# Patient Record
Sex: Female | Born: 1950
Health system: Southern US, Community
[De-identification: ages and names within clinical notes are randomized; demographics above are authoritative.]

## PROBLEM LIST (undated history)

## (undated) DIAGNOSIS — I10 Essential (primary) hypertension: Secondary | ICD-10-CM

## (undated) DIAGNOSIS — G459 Transient cerebral ischemic attack, unspecified: Secondary | ICD-10-CM

## (undated) DIAGNOSIS — Z860100 Personal history of colon polyps, unspecified: Secondary | ICD-10-CM

## (undated) DIAGNOSIS — I639 Cerebral infarction, unspecified: Secondary | ICD-10-CM

## (undated) DIAGNOSIS — R233 Spontaneous ecchymoses: Secondary | ICD-10-CM

## (undated) DIAGNOSIS — L299 Pruritus, unspecified: Secondary | ICD-10-CM

## (undated) DIAGNOSIS — R079 Chest pain, unspecified: Secondary | ICD-10-CM

## (undated) DIAGNOSIS — M1990 Unspecified osteoarthritis, unspecified site: Secondary | ICD-10-CM

## (undated) DIAGNOSIS — J189 Pneumonia, unspecified organism: Secondary | ICD-10-CM

## (undated) DIAGNOSIS — R519 Headache, unspecified: Secondary | ICD-10-CM

## (undated) DIAGNOSIS — R609 Edema, unspecified: Secondary | ICD-10-CM

## (undated) DIAGNOSIS — Z9289 Personal history of other medical treatment: Secondary | ICD-10-CM

## (undated) DIAGNOSIS — T8859XA Other complications of anesthesia, initial encounter: Secondary | ICD-10-CM

## (undated) DIAGNOSIS — M1711 Unilateral primary osteoarthritis, right knee: Secondary | ICD-10-CM

## (undated) DIAGNOSIS — R238 Other skin changes: Secondary | ICD-10-CM

## (undated) DIAGNOSIS — E039 Hypothyroidism, unspecified: Secondary | ICD-10-CM

## (undated) DIAGNOSIS — M1712 Unilateral primary osteoarthritis, left knee: Secondary | ICD-10-CM

## (undated) DIAGNOSIS — E559 Vitamin D deficiency, unspecified: Secondary | ICD-10-CM

## (undated) DIAGNOSIS — R7303 Prediabetes: Secondary | ICD-10-CM

## (undated) DIAGNOSIS — M62838 Other muscle spasm: Secondary | ICD-10-CM

## (undated) DIAGNOSIS — Z8669 Personal history of other diseases of the nervous system and sense organs: Secondary | ICD-10-CM

## (undated) DIAGNOSIS — K219 Gastro-esophageal reflux disease without esophagitis: Secondary | ICD-10-CM

## (undated) DIAGNOSIS — E739 Lactose intolerance, unspecified: Secondary | ICD-10-CM

## (undated) DIAGNOSIS — Z889 Allergy status to unspecified drugs, medicaments and biological substances status: Secondary | ICD-10-CM

## (undated) DIAGNOSIS — K7689 Other specified diseases of liver: Secondary | ICD-10-CM

## (undated) DIAGNOSIS — J984 Other disorders of lung: Secondary | ICD-10-CM

## (undated) DIAGNOSIS — D689 Coagulation defect, unspecified: Secondary | ICD-10-CM

## (undated) DIAGNOSIS — J45909 Unspecified asthma, uncomplicated: Secondary | ICD-10-CM

## (undated) DIAGNOSIS — R1310 Dysphagia, unspecified: Secondary | ICD-10-CM

## (undated) DIAGNOSIS — K5900 Constipation, unspecified: Secondary | ICD-10-CM

## (undated) DIAGNOSIS — F419 Anxiety disorder, unspecified: Secondary | ICD-10-CM

## (undated) DIAGNOSIS — M549 Dorsalgia, unspecified: Secondary | ICD-10-CM

## (undated) DIAGNOSIS — M7989 Other specified soft tissue disorders: Secondary | ICD-10-CM

## (undated) DIAGNOSIS — R51 Headache: Secondary | ICD-10-CM

## (undated) DIAGNOSIS — D649 Anemia, unspecified: Secondary | ICD-10-CM

## (undated) DIAGNOSIS — R600 Localized edema: Secondary | ICD-10-CM

## (undated) DIAGNOSIS — F32A Depression, unspecified: Secondary | ICD-10-CM

## (undated) DIAGNOSIS — R011 Cardiac murmur, unspecified: Secondary | ICD-10-CM

## (undated) DIAGNOSIS — R42 Dizziness and giddiness: Secondary | ICD-10-CM

## (undated) DIAGNOSIS — G4730 Sleep apnea, unspecified: Secondary | ICD-10-CM

## (undated) DIAGNOSIS — T7840XA Allergy, unspecified, initial encounter: Secondary | ICD-10-CM

## (undated) DIAGNOSIS — M2550 Pain in unspecified joint: Secondary | ICD-10-CM

## (undated) DIAGNOSIS — E785 Hyperlipidemia, unspecified: Secondary | ICD-10-CM

## (undated) DIAGNOSIS — T4145XA Adverse effect of unspecified anesthetic, initial encounter: Secondary | ICD-10-CM

## (undated) DIAGNOSIS — E538 Deficiency of other specified B group vitamins: Secondary | ICD-10-CM

## (undated) DIAGNOSIS — M2540 Effusion, unspecified joint: Secondary | ICD-10-CM

## (undated) HISTORY — PX: KNEE ARTHROSCOPY: SHX127

## (undated) HISTORY — DX: Lactose intolerance, unspecified: E73.9

## (undated) HISTORY — DX: Depression, unspecified: F32.A

## (undated) HISTORY — PX: ABDOMINAL HYSTERECTOMY: SHX81

## (undated) HISTORY — DX: Vitamin D deficiency, unspecified: E55.9

## (undated) HISTORY — PX: OTHER SURGICAL HISTORY: SHX169

## (undated) HISTORY — DX: Transient cerebral ischemic attack, unspecified: G45.9

## (undated) HISTORY — DX: Other specified diseases of liver: K76.89

## (undated) HISTORY — DX: Cerebral infarction, unspecified: I63.9

## (undated) HISTORY — DX: Unspecified osteoarthritis, unspecified site: M19.90

## (undated) HISTORY — DX: Allergy, unspecified, initial encounter: T78.40XA

## (undated) HISTORY — DX: Deficiency of other specified B group vitamins: E53.8

## (undated) HISTORY — DX: Other specified soft tissue disorders: M79.89

## (undated) HISTORY — DX: Chest pain, unspecified: R07.9

## (undated) HISTORY — DX: Coagulation defect, unspecified: D68.9

## (undated) HISTORY — DX: Constipation, unspecified: K59.00

## (undated) HISTORY — DX: Other disorders of lung: J98.4

## (undated) HISTORY — PX: JOINT REPLACEMENT: SHX530

## (undated) HISTORY — PX: TONSILLECTOMY: SUR1361

## (undated) HISTORY — PX: COLONOSCOPY: SHX174

## (undated) HISTORY — PX: BREAST SURGERY: SHX581

## (undated) HISTORY — PX: CARDIAC CATHETERIZATION: SHX172

## (undated) HISTORY — DX: Dysphagia, unspecified: R13.10

## (undated) HISTORY — PX: ESOPHAGOGASTRODUODENOSCOPY: SHX1529

## (undated) HISTORY — PX: BACK SURGERY: SHX140

## (undated) HISTORY — DX: Prediabetes: R73.03

## (undated) HISTORY — DX: Dorsalgia, unspecified: M54.9

---

## 2014-06-09 ENCOUNTER — Encounter (HOSPITAL_COMMUNITY): Payer: Self-pay

## 2014-06-09 ENCOUNTER — Other Ambulatory Visit (HOSPITAL_COMMUNITY): Payer: Self-pay

## 2014-06-09 ENCOUNTER — Encounter (HOSPITAL_COMMUNITY)
Admission: RE | Admit: 2014-06-09 | Discharge: 2014-06-09 | Disposition: A | Discharge status: Home / Self Care | Payer: BC Managed Care – PPO | Source: Ambulatory Visit | Attending: Orthopedic Surgery | Admitting: Orthopedic Surgery

## 2014-06-09 DIAGNOSIS — I517 Cardiomegaly: Secondary | ICD-10-CM | POA: Diagnosis not present

## 2014-06-09 DIAGNOSIS — E039 Hypothyroidism, unspecified: Secondary | ICD-10-CM | POA: Diagnosis not present

## 2014-06-09 DIAGNOSIS — R011 Cardiac murmur, unspecified: Secondary | ICD-10-CM | POA: Insufficient documentation

## 2014-06-09 DIAGNOSIS — Z90710 Acquired absence of both cervix and uterus: Secondary | ICD-10-CM | POA: Diagnosis not present

## 2014-06-09 DIAGNOSIS — F419 Anxiety disorder, unspecified: Secondary | ICD-10-CM | POA: Diagnosis not present

## 2014-06-09 DIAGNOSIS — K219 Gastro-esophageal reflux disease without esophagitis: Secondary | ICD-10-CM | POA: Insufficient documentation

## 2014-06-09 DIAGNOSIS — Z8673 Personal history of transient ischemic attack (TIA), and cerebral infarction without residual deficits: Secondary | ICD-10-CM | POA: Insufficient documentation

## 2014-06-09 DIAGNOSIS — M1990 Unspecified osteoarthritis, unspecified site: Secondary | ICD-10-CM | POA: Insufficient documentation

## 2014-06-09 DIAGNOSIS — Z01818 Encounter for other preprocedural examination: Secondary | ICD-10-CM | POA: Diagnosis not present

## 2014-06-09 DIAGNOSIS — J45909 Unspecified asthma, uncomplicated: Secondary | ICD-10-CM | POA: Insufficient documentation

## 2014-06-09 DIAGNOSIS — G4733 Obstructive sleep apnea (adult) (pediatric): Secondary | ICD-10-CM | POA: Insufficient documentation

## 2014-06-09 DIAGNOSIS — R51 Headache: Secondary | ICD-10-CM | POA: Insufficient documentation

## 2014-06-09 DIAGNOSIS — I10 Essential (primary) hypertension: Secondary | ICD-10-CM | POA: Diagnosis not present

## 2014-06-09 HISTORY — DX: Pneumonia, unspecified organism: J18.9

## 2014-06-09 HISTORY — DX: Transient cerebral ischemic attack, unspecified: G45.9

## 2014-06-09 HISTORY — DX: Essential (primary) hypertension: I10

## 2014-06-09 HISTORY — DX: Cardiac murmur, unspecified: R01.1

## 2014-06-09 HISTORY — DX: Personal history of other medical treatment: Z92.89

## 2014-06-09 HISTORY — DX: Hypothyroidism, unspecified: E03.9

## 2014-06-09 HISTORY — DX: Headache, unspecified: R51.9

## 2014-06-09 HISTORY — DX: Unspecified osteoarthritis, unspecified site: M19.90

## 2014-06-09 HISTORY — DX: Headache: R51

## 2014-06-09 HISTORY — DX: Other complications of anesthesia, initial encounter: T88.59XA

## 2014-06-09 HISTORY — DX: Sleep apnea, unspecified: G47.30

## 2014-06-09 HISTORY — DX: Adverse effect of unspecified anesthetic, initial encounter: T41.45XA

## 2014-06-09 HISTORY — DX: Unspecified asthma, uncomplicated: J45.909

## 2014-06-09 HISTORY — DX: Anxiety disorder, unspecified: F41.9

## 2014-06-09 HISTORY — DX: Gastro-esophageal reflux disease without esophagitis: K21.9

## 2014-06-09 LAB — BASIC METABOLIC PANEL
Anion gap: 13 (ref 5–15)
BUN: 24 mg/dL — ABNORMAL HIGH (ref 6–23)
CO2: 30 mEq/L (ref 19–32)
Calcium: 9.7 mg/dL (ref 8.4–10.5)
Chloride: 97 mEq/L (ref 96–112)
Creatinine, Ser: 1.4 mg/dL — ABNORMAL HIGH (ref 0.50–1.10)
GFR calc Af Amer: 46 mL/min — ABNORMAL LOW (ref >90)
GFR calc non Af Amer: 40 mL/min — ABNORMAL LOW (ref >90)
Glucose, Bld: 97 mg/dL (ref 70–99)
Potassium: 3.2 mEq/L — ABNORMAL LOW (ref 3.7–5.3)
Sodium: 140 mEq/L (ref 137–147)

## 2014-06-09 LAB — PROTIME-INR
INR: 1.02 (ref 0.00–1.49)
Prothrombin Time: 13.5 seconds (ref 11.6–15.2)

## 2014-06-09 LAB — SURGICAL PCR SCREEN
MRSA, PCR: NEGATIVE
Staphylococcus aureus: NEGATIVE

## 2014-06-09 LAB — CBC
HCT: 34.8 % — ABNORMAL LOW (ref 36.0–46.0)
Hemoglobin: 11.5 g/dL — ABNORMAL LOW (ref 12.0–15.0)
MCH: 30 pg (ref 26.0–34.0)
MCHC: 33 g/dL (ref 30.0–36.0)
MCV: 90.9 fL (ref 78.0–100.0)
Platelets: 375 10*3/uL (ref 150–400)
RBC: 3.83 MIL/uL — ABNORMAL LOW (ref 3.87–5.11)
RDW: 14.8 % (ref 11.5–15.5)
WBC: 8.4 10*3/uL (ref 4.0–10.5)

## 2014-06-09 LAB — APTT: aPTT: 37 seconds (ref 24–37)

## 2014-06-09 NOTE — Pre-Procedure Instructions (Signed)
Tamara Daugherty  06/09/2014   Your procedure is scheduled on:  Tuesday, December 29th  Report to [facility] Admitting at 530 AM.  Call this number if you have problems the morning of surgery: [phone]   Remember:   Do not eat food or drink liquids after midnight.   Take these medicines the morning of surgery with A SIP OF WATER:    Stop plavix 7 days prior to surgery per dr. Brynda Greathouse   Do not wear jewelry, make-up or nail polish.  Do not wear lotions, powders, or perfumes, deodorant.  Do not shave 48 hours prior to surgery. Men may shave face and neck.  Do not bring valuables to the hospital.  [facility] is not responsible  for any belongings or valuables.               Contacts, dentures or bridgework may not be worn into surgery.  Leave suitcase in the car. After surgery it may be brought to your room.  For patients admitted to the hospital, discharge time is determined by your  treatment team.           Please read over the following fact sheets that you were given: Pain Booklet, Coughing and Deep Breathing, MRSA Information and Surgical Site Infection Prevention  [facility] - Preparing for Surgery  Before surgery, you can play an important role.  Because skin is not sterile, your skin needs to be as free of germs as possible.  You can reduce the number of germs on you skin by washing with CHG (chlorahexidine gluconate) soap before surgery.  CHG is an antiseptic cleaner which kills germs and bonds with the skin to continue killing germs even after washing.  Please DO NOT use if you have an allergy to CHG or antibacterial soaps.  If your skin becomes reddened/irritated stop using the CHG and inform your nurse when you arrive at Short Stay.  Do not shave (including legs and underarms) for at least 48 hours prior to the first CHG shower.  You may shave your face.  Please follow these instructions carefully:   1.  Shower with CHG Soap the night before surgery and the  morning of Surgery.  2.  If you choose to wash your hair, wash your hair first as usual with your normal shampoo.  3.  After you shampoo, rinse your hair and body thoroughly to remove the shampoo.  4.  Use CHG as you would any other liquid soap.  You can apply CHG directly to the skin and wash gently with scrungie or a clean washcloth.  5.  Apply the CHG Soap to your body ONLY FROM THE NECK DOWN.  Do not use on open wounds or open sores.  Avoid contact with your eyes, ears, mouth and genitals (private parts).  Wash genitals (private parts) with your normal soap.  6.  Wash thoroughly, paying special attention to the area where your surgery will be performed.  7.  Thoroughly rinse your body with warm water from the neck down.  8.  DO NOT shower/wash with your normal soap after using and rinsing off the CHG Soap.  9.  Pat yourself dry with a clean towel.            10.  Wear clean pajamas.            11.  Place clean sheets on your bed the night of your first shower and do not sleep with pets.  Day of Surgery  Do not apply any lotions/deoderants the morning of surgery.  Please wear clean clothes to the hospital/surgery center.

## 2014-06-09 NOTE — Progress Notes (Addendum)
Anesthesia Chart Review:  Patient is a 63 year old female scheduled for left TKA on 06/21/14 by Dr. Mardelle Matte.  History includes non-smoker, HTN, TIA, murmur (no valvular abnormality on 09/2013 echo), hypothyroidism, OSA with CPAP, anxiety, asthma, GERD, headaches, arthritis, hysterectomy. BMI is consistent with morbid obesity.  PCP is Dr. Brynda Greathouse who cleared patient from a medical and cardiac standpoint with permission to hold Plavix 7 days prior to surgery. Cardiologist is Dr. Ashby Dawes in [place], last visit 12/13/13 with normal perfusion scan. Pulmonologist is Dr. Elsworth Soho. Many of these [place] records can be viewed in [place]. Dr. Sherri Sear 05/03/14 note states to continue autoPAP and adjust pressure to 10-20.  He advised her to take her CPAP with her for her knee surgery and felt there was no contraindication to this from his point of view.  EKG 06/09/14: NSR, LAD, inferior infarct (age undetermined), possible anterior infarct (age undetermined). Currently, there are no previous tracings, but by interpretation report from 01/14/13 EKG in Care Everywhere, she had SR, first degree AVB, LAD with Q wave noted in lead III which was not felt significantly changed from previous 05/15/11 stress test EKG.   12/15/13 Nuclear stress test (Care Everywhere): IMPRESSION: Normal perfusion study. EF 63%. No transient ischemic dilatation or diagnostic EKG changes.  10/07/13 Echo (Care Everywhere):  Summary 1. Overall left ventricular ejection fraction is estimated at 60 to 65%. 2. Normal global left ventricular systolic function. 3. (Grade 1) Mildly abnormal left ventricular diastolic filling. 4. Mild concentric left ventricular hypertrophy. 5. Sigmoid basal septum. 6. Limited resolution study. 7. Atrial septal aneurysm. 8. No intracardiac thrombi, mass or vegetations.  Preoperative labs noted. H/H 11.5/34.8. K 3.2. Glucose 97. BUN/Cr 24/1.40. (Cr 1.11 - 1.51 since 01/14/13, A1C 5.6 on 05/03/14 in [place].)   Additional faxed cardiology records reviewed.  No old EKG tracing received.  No right or left heart cath within the past five years. As above, she had recent echo and non-ischemic stress test. Her labs appear stable. Her medical doctor has cleared her for surgery. Based on currently available records, I would anticipate that she could proceed as planned in no acute changes.  George Hugh [facility] Short Stay Center/Anesthesiology Phone [phone] 06/10/2014 2:56 PM

## 2014-06-09 NOTE — Progress Notes (Signed)
Primary - dr. Brynda Greathouse Cardiologist - [facility] Pulmonary - dr. Elsworth Soho  Clearance from [facility] on chart (patient has not seen ramachandran since June 2015) Stress test in chart from June 2015.

## 2014-06-13 ENCOUNTER — Other Ambulatory Visit (HOSPITAL_COMMUNITY): Payer: Self-pay

## 2014-06-20 MED ORDER — CEFAZOLIN SODIUM 10 G IJ SOLR
3.0000 g | INTRAMUSCULAR | Status: AC
  Administered 2014-06-21: 3 g via INTRAVENOUS
  Filled 2014-06-20: qty 3000

## 2014-06-20 NOTE — Anesthesia Preprocedure Evaluation (Addendum)
Anesthesia Evaluation  Patient identified by MRN, date of birth, ID band Patient awake    Reviewed: Allergy & Precautions, H&P , NPO status , Patient's Chart, lab work & pertinent test results, reviewed documented beta blocker date and time   History of Anesthesia Complications (+) PROLONGED EMERGENCE  Airway Mallampati: II       Dental  (+) Teeth Intact   Pulmonary asthma , sleep apnea and Continuous Positive Airway Pressure Ventilation ,  breath sounds clear to auscultation        Cardiovascular hypertension, Pt. on medications  2015 STRESS OK, 2015 ECHO EF 55%   Neuro/Psych    GI/Hepatic Neg liver ROS, GERD-  ,  Endo/Other    Renal/GU Renal InsufficiencyRenal diseaseGFR 46     Musculoskeletal   Abdominal (+)  Abdomen: soft.    Peds  Hematology negative hematology ROS (+)   Anesthesia Other Findings   Reproductive/Obstetrics                            Anesthesia Physical Anesthesia Plan  ASA: III  Anesthesia Plan: General   Post-op Pain Management: MAC Combined w/ Regional for Post-op pain   Induction: Intravenous  Airway Management Planned: Oral ETT  Additional Equipment:   Intra-op Plan:   Post-operative Plan: Extubation in OR  Informed Consent: I have reviewed the patients History and Physical, chart, labs and discussed the procedure including the risks, benefits and alternatives for the proposed anesthesia with the patient or authorized representative who has indicated his/her understanding and acceptance.     Plan Discussed with:   Anesthesia Plan Comments:         Anesthesia Quick Evaluation

## 2014-06-21 ENCOUNTER — Encounter (HOSPITAL_COMMUNITY): Payer: Self-pay | Admitting: *Deleted

## 2014-06-21 ENCOUNTER — Inpatient Hospital Stay (HOSPITAL_COMMUNITY): Payer: BC Managed Care – PPO

## 2014-06-21 ENCOUNTER — Inpatient Hospital Stay (HOSPITAL_COMMUNITY): Payer: BC Managed Care – PPO | Admitting: Anesthesiology

## 2014-06-21 ENCOUNTER — Inpatient Hospital Stay (HOSPITAL_COMMUNITY): Payer: BC Managed Care – PPO | Admitting: Emergency Medicine

## 2014-06-21 ENCOUNTER — Inpatient Hospital Stay (HOSPITAL_COMMUNITY)
Admission: RE | Admit: 2014-06-21 | Discharge: 2014-06-23 | DRG: 470 | Disposition: A | Discharge status: Home Health | Payer: BC Managed Care – PPO | Source: Ambulatory Visit | Attending: Orthopedic Surgery | Admitting: Orthopedic Surgery

## 2014-06-21 ENCOUNTER — Encounter (HOSPITAL_COMMUNITY)
Admission: RE | Disposition: A | Discharge status: Home Health | Payer: Self-pay | Source: Ambulatory Visit | Attending: Orthopedic Surgery

## 2014-06-21 DIAGNOSIS — E871 Hypo-osmolality and hyponatremia: Secondary | ICD-10-CM | POA: Diagnosis not present

## 2014-06-21 DIAGNOSIS — N289 Disorder of kidney and ureter, unspecified: Secondary | ICD-10-CM | POA: Diagnosis not present

## 2014-06-21 DIAGNOSIS — F419 Anxiety disorder, unspecified: Secondary | ICD-10-CM | POA: Diagnosis present

## 2014-06-21 DIAGNOSIS — Z7951 Long term (current) use of inhaled steroids: Secondary | ICD-10-CM | POA: Diagnosis not present

## 2014-06-21 DIAGNOSIS — Z6841 Body Mass Index (BMI) 40.0 and over, adult: Secondary | ICD-10-CM | POA: Diagnosis not present

## 2014-06-21 DIAGNOSIS — Z91030 Bee allergy status: Secondary | ICD-10-CM | POA: Diagnosis not present

## 2014-06-21 DIAGNOSIS — G4730 Sleep apnea, unspecified: Secondary | ICD-10-CM | POA: Diagnosis present

## 2014-06-21 DIAGNOSIS — M179 Osteoarthritis of knee, unspecified: Secondary | ICD-10-CM | POA: Diagnosis present

## 2014-06-21 DIAGNOSIS — Z887 Allergy status to serum and vaccine status: Secondary | ICD-10-CM

## 2014-06-21 DIAGNOSIS — D62 Acute posthemorrhagic anemia: Secondary | ICD-10-CM | POA: Diagnosis not present

## 2014-06-21 DIAGNOSIS — Z79899 Other long term (current) drug therapy: Secondary | ICD-10-CM | POA: Diagnosis not present

## 2014-06-21 DIAGNOSIS — J45909 Unspecified asthma, uncomplicated: Secondary | ICD-10-CM | POA: Diagnosis present

## 2014-06-21 DIAGNOSIS — Z880 Allergy status to penicillin: Secondary | ICD-10-CM | POA: Diagnosis not present

## 2014-06-21 DIAGNOSIS — E039 Hypothyroidism, unspecified: Secondary | ICD-10-CM | POA: Diagnosis present

## 2014-06-21 DIAGNOSIS — K219 Gastro-esophageal reflux disease without esophagitis: Secondary | ICD-10-CM | POA: Diagnosis present

## 2014-06-21 DIAGNOSIS — Z7902 Long term (current) use of antithrombotics/antiplatelets: Secondary | ICD-10-CM

## 2014-06-21 DIAGNOSIS — Z8673 Personal history of transient ischemic attack (TIA), and cerebral infarction without residual deficits: Secondary | ICD-10-CM | POA: Diagnosis not present

## 2014-06-21 DIAGNOSIS — Z96652 Presence of left artificial knee joint: Secondary | ICD-10-CM

## 2014-06-21 DIAGNOSIS — Z90710 Acquired absence of both cervix and uterus: Secondary | ICD-10-CM | POA: Diagnosis not present

## 2014-06-21 DIAGNOSIS — I10 Essential (primary) hypertension: Secondary | ICD-10-CM | POA: Diagnosis present

## 2014-06-21 DIAGNOSIS — M1712 Unilateral primary osteoarthritis, left knee: Principal | ICD-10-CM

## 2014-06-21 DIAGNOSIS — M1710 Unilateral primary osteoarthritis, unspecified knee: Secondary | ICD-10-CM | POA: Diagnosis present

## 2014-06-21 HISTORY — PX: TOTAL KNEE ARTHROPLASTY: SHX125

## 2014-06-21 HISTORY — DX: Unilateral primary osteoarthritis, left knee: M17.12

## 2014-06-21 SURGERY — ARTHROPLASTY, KNEE, TOTAL
Anesthesia: Regional | Site: Knee | Laterality: Left

## 2014-06-21 MED ORDER — NEOSTIGMINE METHYLSULFATE 10 MG/10ML IV SOLN
INTRAVENOUS | Status: DC | PRN
  Administered 2014-06-21: 4 mg via INTRAVENOUS

## 2014-06-21 MED ORDER — MIDAZOLAM HCL 5 MG/5ML IJ SOLN
INTRAMUSCULAR | Status: DC | PRN
  Administered 2014-06-21: 2 mg via INTRAVENOUS

## 2014-06-21 MED ORDER — MECLIZINE HCL 25 MG PO TABS
25.0000 mg | ORAL_TABLET | Freq: Four times a day (QID) | ORAL | Status: DC | PRN
  Filled 2014-06-21: qty 1

## 2014-06-21 MED ORDER — ALBUTEROL SULFATE (2.5 MG/3ML) 0.083% IN NEBU: 3.0000 mL | INHALATION_SOLUTION | Freq: Four times a day (QID) | RESPIRATORY_TRACT | Status: DC | PRN

## 2014-06-21 MED ORDER — ACETAMINOPHEN 325 MG PO TABS: 650.0000 mg | ORAL_TABLET | Freq: Four times a day (QID) | ORAL | Status: DC | PRN

## 2014-06-21 MED ORDER — FENTANYL CITRATE 0.05 MG/ML IJ SOLN
INTRAMUSCULAR | Status: DC | PRN
  Administered 2014-06-21 (×6): 50 ug via INTRAVENOUS

## 2014-06-21 MED ORDER — GLYCOPYRROLATE 0.2 MG/ML IJ SOLN
INTRAMUSCULAR | Status: DC | PRN
  Administered 2014-06-21: 0.6 mg via INTRAVENOUS

## 2014-06-21 MED ORDER — BISACODYL 10 MG RE SUPP: 10.0000 mg | Freq: Every day | RECTAL | Status: DC | PRN

## 2014-06-21 MED ORDER — FENTANYL CITRATE 0.05 MG/ML IJ SOLN
INTRAMUSCULAR | Status: AC
  Filled 2014-06-21: qty 5

## 2014-06-21 MED ORDER — HYDROMORPHONE HCL 1 MG/ML IJ SOLN
1.0000 mg | INTRAMUSCULAR | Status: DC | PRN
  Administered 2014-06-21 – 2014-06-22 (×2): 1 mg via INTRAVENOUS
  Filled 2014-06-21: qty 1

## 2014-06-21 MED ORDER — GLYCOPYRROLATE 0.2 MG/ML IJ SOLN
INTRAMUSCULAR | Status: AC
Start: 2014-06-21 — End: 2014-06-21
  Filled 2014-06-21: qty 3

## 2014-06-21 MED ORDER — ROCURONIUM BROMIDE 50 MG/5ML IV SOLN
INTRAVENOUS | Status: AC
  Filled 2014-06-21: qty 1

## 2014-06-21 MED ORDER — RIVAROXABAN 10 MG PO TABS
10.0000 mg | ORAL_TABLET | Freq: Every day | ORAL | Status: DC
  Administered 2014-06-22 – 2014-06-23 (×2): 10 mg via ORAL
  Filled 2014-06-21 (×4): qty 1

## 2014-06-21 MED ORDER — PROPOFOL 10 MG/ML IV BOLUS
INTRAVENOUS | Status: DC | PRN
  Administered 2014-06-21: 200 mg via INTRAVENOUS

## 2014-06-21 MED ORDER — TORSEMIDE 20 MG PO TABS
20.0000 mg | ORAL_TABLET | Freq: Every day | ORAL | Status: DC
  Administered 2014-06-22 – 2014-06-23 (×2): 20 mg via ORAL
  Filled 2014-06-21 (×3): qty 1

## 2014-06-21 MED ORDER — LORATADINE 10 MG PO TABS
10.0000 mg | ORAL_TABLET | Freq: Every day | ORAL | Status: DC | PRN
  Filled 2014-06-21: qty 1

## 2014-06-21 MED ORDER — HYDROCHLOROTHIAZIDE 25 MG PO TABS
25.0000 mg | ORAL_TABLET | Freq: Every day | ORAL | Status: DC
  Administered 2014-06-22 – 2014-06-23 (×2): 25 mg via ORAL
  Filled 2014-06-21 (×3): qty 1

## 2014-06-21 MED ORDER — NYSTATIN 100000 UNIT/GM EX CREA
1.0000 "application " | TOPICAL_CREAM | Freq: Two times a day (BID) | CUTANEOUS | Status: DC | PRN
  Filled 2014-06-21: qty 15

## 2014-06-21 MED ORDER — LACTATED RINGERS IV SOLN
INTRAVENOUS | Status: DC | PRN
  Administered 2014-06-21 (×2): via INTRAVENOUS

## 2014-06-21 MED ORDER — ROCURONIUM BROMIDE 100 MG/10ML IV SOLN
INTRAVENOUS | Status: DC | PRN
  Administered 2014-06-21: 50 mg via INTRAVENOUS
  Administered 2014-06-21: 10 mg via INTRAVENOUS

## 2014-06-21 MED ORDER — BUDESONIDE-FORMOTEROL FUMARATE 80-4.5 MCG/ACT IN AERO
2.0000 | INHALATION_SPRAY | Freq: Two times a day (BID) | RESPIRATORY_TRACT | Status: DC
  Administered 2014-06-21 – 2014-06-23 (×4): 2 via RESPIRATORY_TRACT
  Filled 2014-06-21: qty 6.9

## 2014-06-21 MED ORDER — METOCLOPRAMIDE HCL 10 MG PO TABS: 5.0000 mg | ORAL_TABLET | Freq: Three times a day (TID) | ORAL | Status: DC | PRN

## 2014-06-21 MED ORDER — METOCLOPRAMIDE HCL 5 MG/ML IJ SOLN: 5.0000 mg | Freq: Three times a day (TID) | INTRAMUSCULAR | Status: DC | PRN

## 2014-06-21 MED ORDER — MENTHOL 3 MG MT LOZG
1.0000 | LOZENGE | OROMUCOSAL | Status: DC | PRN
  Filled 2014-06-21: qty 9

## 2014-06-21 MED ORDER — SODIUM CHLORIDE 0.9 % IJ SOLN
INTRAMUSCULAR | Status: AC
  Filled 2014-06-21: qty 10

## 2014-06-21 MED ORDER — DIPHENHYDRAMINE HCL 12.5 MG/5ML PO ELIX: 12.5000 mg | ORAL_SOLUTION | ORAL | Status: DC | PRN

## 2014-06-21 MED ORDER — MEPERIDINE HCL 25 MG/ML IJ SOLN: 6.2500 mg | INTRAMUSCULAR | Status: DC | PRN

## 2014-06-21 MED ORDER — ALUM & MAG HYDROXIDE-SIMETH 200-200-20 MG/5ML PO SUSP: 30.0000 mL | ORAL | Status: DC | PRN

## 2014-06-21 MED ORDER — ONDANSETRON HCL 4 MG/2ML IJ SOLN
INTRAMUSCULAR | Status: DC | PRN
  Administered 2014-06-21: 4 mg via INTRAVENOUS

## 2014-06-21 MED ORDER — PROPOFOL 10 MG/ML IV BOLUS
INTRAVENOUS | Status: AC
  Filled 2014-06-21: qty 20

## 2014-06-21 MED ORDER — POTASSIUM CHLORIDE IN NACL 20-0.45 MEQ/L-% IV SOLN
INTRAVENOUS | Status: DC
  Administered 2014-06-21 – 2014-06-22 (×2): via INTRAVENOUS
  Filled 2014-06-21 (×6): qty 1000

## 2014-06-21 MED ORDER — METHOCARBAMOL 1000 MG/10ML IJ SOLN
500.0000 mg | Freq: Four times a day (QID) | INTRAVENOUS | Status: DC | PRN
  Filled 2014-06-21: qty 5

## 2014-06-21 MED ORDER — FENTANYL CITRATE 0.05 MG/ML IJ SOLN
INTRAMUSCULAR | Status: AC
  Filled 2014-06-21: qty 2

## 2014-06-21 MED ORDER — OXYCODONE HCL 5 MG PO TABS
5.0000 mg | ORAL_TABLET | ORAL | Status: DC | PRN
  Administered 2014-06-21 (×3): 10 mg via ORAL
  Administered 2014-06-21: 5 mg via ORAL
  Administered 2014-06-22 – 2014-06-23 (×7): 10 mg via ORAL
  Filled 2014-06-21 (×10): qty 2

## 2014-06-21 MED ORDER — DILTIAZEM HCL ER COATED BEADS 240 MG PO TB24
240.0000 mg | ORAL_TABLET | Freq: Every day | ORAL | Status: DC
  Administered 2014-06-21 – 2014-06-23 (×3): 240 mg via ORAL
  Filled 2014-06-21 (×3): qty 1

## 2014-06-21 MED ORDER — VITAMIN B-6 50 MG PO TABS
50.0000 mg | ORAL_TABLET | Freq: Every day | ORAL | Status: DC
  Administered 2014-06-23: 50 mg via ORAL
  Filled 2014-06-21 (×3): qty 1

## 2014-06-21 MED ORDER — SENNA-DOCUSATE SODIUM 8.6-50 MG PO TABS: 2.0000 | ORAL_TABLET | Freq: Every day | ORAL | Status: DC

## 2014-06-21 MED ORDER — ONDANSETRON HCL 4 MG/2ML IJ SOLN
4.0000 mg | Freq: Four times a day (QID) | INTRAMUSCULAR | Status: DC | PRN
  Administered 2014-06-21 – 2014-06-22 (×2): 4 mg via INTRAVENOUS
  Filled 2014-06-21 (×2): qty 2

## 2014-06-21 MED ORDER — HYDROXYZINE HCL 25 MG PO TABS: 25.0000 mg | ORAL_TABLET | Freq: Three times a day (TID) | ORAL | Status: DC | PRN

## 2014-06-21 MED ORDER — ONDANSETRON HCL 4 MG PO TABS: 4.0000 mg | ORAL_TABLET | Freq: Three times a day (TID) | ORAL | Status: DC | PRN

## 2014-06-21 MED ORDER — ONDANSETRON HCL 4 MG PO TABS
4.0000 mg | ORAL_TABLET | Freq: Four times a day (QID) | ORAL | Status: DC | PRN
  Administered 2014-06-22 (×2): 4 mg via ORAL
  Filled 2014-06-21 (×2): qty 1

## 2014-06-21 MED ORDER — SODIUM CHLORIDE 0.9 % IR SOLN
Status: DC | PRN
  Administered 2014-06-21: 1000 mL

## 2014-06-21 MED ORDER — OXYCODONE-ACETAMINOPHEN 10-325 MG PO TABS: 1.0000 | ORAL_TABLET | Freq: Four times a day (QID) | ORAL | Status: DC | PRN

## 2014-06-21 MED ORDER — KETOROLAC TROMETHAMINE 15 MG/ML IJ SOLN
7.5000 mg | Freq: Four times a day (QID) | INTRAMUSCULAR | Status: AC
  Administered 2014-06-21 – 2014-06-22 (×4): 7.5 mg via INTRAVENOUS

## 2014-06-21 MED ORDER — BACLOFEN 10 MG PO TABS: 10.0000 mg | ORAL_TABLET | Freq: Three times a day (TID) | ORAL | Status: DC

## 2014-06-21 MED ORDER — HYDROMORPHONE HCL 1 MG/ML IJ SOLN
INTRAMUSCULAR | Status: AC
  Filled 2014-06-21: qty 1

## 2014-06-21 MED ORDER — KETOROLAC TROMETHAMINE 15 MG/ML IJ SOLN
INTRAMUSCULAR | Status: AC
  Filled 2014-06-21: qty 1

## 2014-06-21 MED ORDER — OLMESARTAN MEDOXOMIL-HCTZ 40-25 MG PO TABS: 1.0000 | ORAL_TABLET | Freq: Every day | ORAL | Status: DC

## 2014-06-21 MED ORDER — PHENOL 1.4 % MT LIQD: 1.0000 | OROMUCOSAL | Status: DC | PRN

## 2014-06-21 MED ORDER — DOCUSATE SODIUM 100 MG PO CAPS
100.0000 mg | ORAL_CAPSULE | Freq: Two times a day (BID) | ORAL | Status: DC
  Administered 2014-06-22 – 2014-06-23 (×3): 100 mg via ORAL
  Filled 2014-06-21 (×4): qty 1

## 2014-06-21 MED ORDER — MIDAZOLAM HCL 2 MG/2ML IJ SOLN
INTRAMUSCULAR | Status: AC
  Filled 2014-06-21: qty 2

## 2014-06-21 MED ORDER — HYDROCORTISONE 1 % EX CREA
TOPICAL_CREAM | Freq: Two times a day (BID) | CUTANEOUS | Status: DC | PRN
  Filled 2014-06-21: qty 28

## 2014-06-21 MED ORDER — ARTIFICIAL TEARS OP OINT
TOPICAL_OINTMENT | OPHTHALMIC | Status: DC | PRN
  Administered 2014-06-21: 1 via OPHTHALMIC

## 2014-06-21 MED ORDER — LIDOCAINE HCL (CARDIAC) 20 MG/ML IV SOLN
INTRAVENOUS | Status: DC | PRN
  Administered 2014-06-21: 100 mg via INTRAVENOUS
  Administered 2014-06-21: 40 mg via INTRAVENOUS

## 2014-06-21 MED ORDER — NEOSTIGMINE METHYLSULFATE 10 MG/10ML IV SOLN
INTRAVENOUS | Status: AC
Start: 2014-06-21 — End: 2014-06-21
  Filled 2014-06-21: qty 1

## 2014-06-21 MED ORDER — BUPIVACAINE-EPINEPHRINE (PF) 0.5% -1:200000 IJ SOLN
INTRAMUSCULAR | Status: DC | PRN
  Administered 2014-06-21: 30 mL via PERINEURAL

## 2014-06-21 MED ORDER — METHOCARBAMOL 500 MG PO TABS
500.0000 mg | ORAL_TABLET | Freq: Four times a day (QID) | ORAL | Status: DC | PRN
Start: 2014-06-21 — End: 2014-06-23
  Administered 2014-06-21 – 2014-06-23 (×4): 500 mg via ORAL
  Filled 2014-06-21 (×3): qty 1

## 2014-06-21 MED ORDER — LIDOCAINE HCL (CARDIAC) 20 MG/ML IV SOLN
INTRAVENOUS | Status: AC
  Filled 2014-06-21: qty 5

## 2014-06-21 MED ORDER — 0.9 % SODIUM CHLORIDE (POUR BTL) OPTIME
TOPICAL | Status: DC | PRN
  Administered 2014-06-21: 1000 mL

## 2014-06-21 MED ORDER — LEVOTHYROXINE SODIUM 50 MCG PO TABS
50.0000 ug | ORAL_TABLET | Freq: Every day | ORAL | Status: DC
  Administered 2014-06-22 – 2014-06-23 (×2): 50 ug via ORAL
  Filled 2014-06-21 (×3): qty 1

## 2014-06-21 MED ORDER — ACETAMINOPHEN 650 MG RE SUPP: 650.0000 mg | Freq: Four times a day (QID) | RECTAL | Status: DC | PRN

## 2014-06-21 MED ORDER — PROMETHAZINE HCL 25 MG/ML IJ SOLN: 6.2500 mg | INTRAMUSCULAR | Status: DC | PRN

## 2014-06-21 MED ORDER — ONDANSETRON HCL 4 MG/2ML IJ SOLN
INTRAMUSCULAR | Status: AC
  Filled 2014-06-21: qty 2

## 2014-06-21 MED ORDER — SIMVASTATIN 20 MG PO TABS
20.0000 mg | ORAL_TABLET | Freq: Every day | ORAL | Status: DC
  Administered 2014-06-21 – 2014-06-23 (×3): 20 mg via ORAL
  Filled 2014-06-21 (×3): qty 1

## 2014-06-21 MED ORDER — CEFAZOLIN SODIUM-DEXTROSE 2-3 GM-% IV SOLR
2.0000 g | Freq: Four times a day (QID) | INTRAVENOUS | Status: AC
  Administered 2014-06-21 (×2): 2 g via INTRAVENOUS
  Filled 2014-06-21 (×2): qty 50

## 2014-06-21 MED ORDER — ARTIFICIAL TEARS OP OINT
TOPICAL_OINTMENT | OPHTHALMIC | Status: AC
  Filled 2014-06-21: qty 3.5

## 2014-06-21 MED ORDER — VITAMIN B-6 50 MG PO TABS: 50.0000 mg | ORAL_TABLET | Freq: Every day | ORAL | Status: DC

## 2014-06-21 MED ORDER — RIVAROXABAN 10 MG PO TABS: 10.0000 mg | ORAL_TABLET | Freq: Every day | ORAL | Status: DC

## 2014-06-21 MED ORDER — METHOCARBAMOL 500 MG PO TABS
ORAL_TABLET | ORAL | Status: AC
  Filled 2014-06-21: qty 1

## 2014-06-21 MED ORDER — POTASSIUM CHLORIDE CRYS ER 20 MEQ PO TBCR
20.0000 meq | EXTENDED_RELEASE_TABLET | Freq: Two times a day (BID) | ORAL | Status: DC
  Administered 2014-06-22 – 2014-06-23 (×3): 20 meq via ORAL
  Filled 2014-06-21 (×4): qty 1

## 2014-06-21 MED ORDER — GUAIFENESIN ER 600 MG PO TB12
600.0000 mg | ORAL_TABLET | Freq: Two times a day (BID) | ORAL | Status: DC | PRN
  Filled 2014-06-21: qty 1

## 2014-06-21 MED ORDER — PANTOPRAZOLE SODIUM 40 MG PO TBEC
40.0000 mg | DELAYED_RELEASE_TABLET | Freq: Every day | ORAL | Status: DC
  Administered 2014-06-22: 40 mg via ORAL
  Filled 2014-06-21: qty 1

## 2014-06-21 MED ORDER — OXYCODONE HCL 5 MG PO TABS
ORAL_TABLET | ORAL | Status: AC
  Filled 2014-06-21: qty 1

## 2014-06-21 MED ORDER — SENNA 8.6 MG PO TABS
1.0000 | ORAL_TABLET | Freq: Two times a day (BID) | ORAL | Status: DC
  Administered 2014-06-21 – 2014-06-23 (×4): 8.6 mg via ORAL
  Filled 2014-06-21 (×5): qty 1

## 2014-06-21 MED ORDER — MAGNESIUM CITRATE PO SOLN: 1.0000 | Freq: Once | ORAL | Status: AC | PRN

## 2014-06-21 MED ORDER — FLUTICASONE PROPIONATE 50 MCG/ACT NA SUSP
1.0000 | Freq: Two times a day (BID) | NASAL | Status: DC | PRN
  Filled 2014-06-21: qty 16

## 2014-06-21 MED ORDER — FENTANYL CITRATE 0.05 MG/ML IJ SOLN
25.0000 ug | INTRAMUSCULAR | Status: DC | PRN
  Administered 2014-06-21 (×4): 25 ug via INTRAVENOUS

## 2014-06-21 MED ORDER — HYDROCORTISONE 2.5 % EX CREA: 1.0000 "application " | TOPICAL_CREAM | Freq: Two times a day (BID) | CUTANEOUS | Status: DC | PRN

## 2014-06-21 MED ORDER — POLYETHYLENE GLYCOL 3350 17 G PO PACK: 17.0000 g | PACK | Freq: Every day | ORAL | Status: DC | PRN

## 2014-06-21 MED ORDER — IRBESARTAN 300 MG PO TABS
300.0000 mg | ORAL_TABLET | Freq: Every day | ORAL | Status: DC
  Administered 2014-06-22 – 2014-06-23 (×2): 300 mg via ORAL
  Filled 2014-06-21 (×3): qty 1

## 2014-06-21 SURGICAL SUPPLY — 71 items
BANDAGE ELASTIC 6 VELCRO ST LF (GAUZE/BANDAGES/DRESSINGS) IMPLANT
BANDAGE ESMARK 6X9 LF (GAUZE/BANDAGES/DRESSINGS) ×1 IMPLANT
BENZOIN TINCTURE PRP APPL 2/3 (GAUZE/BANDAGES/DRESSINGS) ×3 IMPLANT
BLADE SAG 18X100X1.27 (BLADE) ×3 IMPLANT
BLADE SAW RECIP 87.9 MT (BLADE) ×3 IMPLANT
BLADE SAW SGTL 13X75X1.27 (BLADE) ×3 IMPLANT
BLADE SURG 10 STRL SS (BLADE) ×9 IMPLANT
BNDG ELASTIC 6X10 VLCR STRL LF (GAUZE/BANDAGES/DRESSINGS) ×3 IMPLANT
BNDG ESMARK 6X9 LF (GAUZE/BANDAGES/DRESSINGS) ×3
BOOTCOVER CLEANROOM LRG (PROTECTIVE WEAR) ×6 IMPLANT
BOWL SMART MIX CTS (DISPOSABLE) ×3 IMPLANT
CAP KNEE TOTAL 3 SIGMA ×3 IMPLANT
CEMENT HV SMART SET (Cement) ×6 IMPLANT
CLOSURE STERI-STRIP 1/2X4 (GAUZE/BANDAGES/DRESSINGS) ×1
CLOSURE WOUND 1/2 X4 (GAUZE/BANDAGES/DRESSINGS) ×1
CLSR STERI-STRIP ANTIMIC 1/2X4 (GAUZE/BANDAGES/DRESSINGS) ×2 IMPLANT
COVER SURGICAL LIGHT HANDLE (MISCELLANEOUS) ×3 IMPLANT
CUFF TOURNIQUET SINGLE 34IN LL (TOURNIQUET CUFF) ×3 IMPLANT
DRAPE EXTREMITY T 121X128X90 (DRAPE) ×3 IMPLANT
DRAPE IMP U-DRAPE 54X76 (DRAPES) IMPLANT
DRAPE PROXIMA HALF (DRAPES) ×6 IMPLANT
DRAPE U-SHAPE 47X51 STRL (DRAPES) ×3 IMPLANT
DRSG PAD ABDOMINAL 8X10 ST (GAUZE/BANDAGES/DRESSINGS) ×3 IMPLANT
DURAPREP 26ML APPLICATOR (WOUND CARE) ×6 IMPLANT
ELECT CAUTERY BLADE 6.4 (BLADE) ×3 IMPLANT
ELECT REM PT RETURN 9FT ADLT (ELECTROSURGICAL) ×3
ELECTRODE REM PT RTRN 9FT ADLT (ELECTROSURGICAL) ×1 IMPLANT
FACESHIELD WRAPAROUND (MASK) ×3 IMPLANT
GAUZE SPONGE 4X4 12PLY STRL (GAUZE/BANDAGES/DRESSINGS) ×3 IMPLANT
GLOVE BIOGEL PI IND STRL 7.0 (GLOVE) ×1 IMPLANT
GLOVE BIOGEL PI IND STRL 7.5 (GLOVE) ×1 IMPLANT
GLOVE BIOGEL PI IND STRL 8 (GLOVE) ×1 IMPLANT
GLOVE BIOGEL PI INDICATOR 7.0 (GLOVE) ×2
GLOVE BIOGEL PI INDICATOR 7.5 (GLOVE) ×2
GLOVE BIOGEL PI INDICATOR 8 (GLOVE) ×2
GLOVE BIOGEL PI ORTHO PRO SZ8 (GLOVE) ×4
GLOVE ORTHO TXT STRL SZ7.5 (GLOVE) ×6 IMPLANT
GLOVE PI ORTHO PRO STRL SZ8 (GLOVE) ×2 IMPLANT
GLOVE SURG ORTHO 8.0 STRL STRW (GLOVE) ×9 IMPLANT
GLOVE SURG SS PI 6.5 STRL IVOR (GLOVE) ×3 IMPLANT
GLOVE SURG SS PI 7.5 STRL IVOR (GLOVE) ×3 IMPLANT
GOWN STRL REUS W/ TWL XL LVL3 (GOWN DISPOSABLE) ×3 IMPLANT
GOWN STRL REUS W/TWL 2XL LVL3 (GOWN DISPOSABLE) ×6 IMPLANT
GOWN STRL REUS W/TWL XL LVL3 (GOWN DISPOSABLE) ×6
HANDPIECE INTERPULSE COAX TIP (DISPOSABLE) ×2
HOOD PEEL AWAY FACE SHEILD DIS (HOOD) ×6 IMPLANT
IMMOBILIZER KNEE 22 (SOFTGOODS) ×3 IMPLANT
KIT BASIN OR (CUSTOM PROCEDURE TRAY) ×3 IMPLANT
KIT ROOM TURNOVER OR (KITS) ×3 IMPLANT
MANIFOLD NEPTUNE II (INSTRUMENTS) ×3 IMPLANT
NS IRRIG 1000ML POUR BTL (IV SOLUTION) ×3 IMPLANT
PACK TOTAL JOINT (CUSTOM PROCEDURE TRAY) ×3 IMPLANT
PACK UNIVERSAL I (CUSTOM PROCEDURE TRAY) ×3 IMPLANT
PAD ABD 8X10 STRL (GAUZE/BANDAGES/DRESSINGS) ×3 IMPLANT
PAD ARMBOARD 7.5X6 YLW CONV (MISCELLANEOUS) ×6 IMPLANT
PAD CAST 4YDX4 CTTN HI CHSV (CAST SUPPLIES) ×1 IMPLANT
PADDING CAST COTTON 4X4 STRL (CAST SUPPLIES) ×2
PADDING CAST COTTON 6X4 STRL (CAST SUPPLIES) ×3 IMPLANT
SET HNDPC FAN SPRY TIP SCT (DISPOSABLE) ×1 IMPLANT
STRIP CLOSURE SKIN 1/2X4 (GAUZE/BANDAGES/DRESSINGS) ×2 IMPLANT
SUCTION FRAZIER TIP 10 FR DISP (SUCTIONS) ×3 IMPLANT
SUT MNCRL AB 4-0 PS2 18 (SUTURE) IMPLANT
SUT VIC AB 0 CT1 27 (SUTURE) ×2
SUT VIC AB 0 CT1 27XBRD ANBCTR (SUTURE) ×1 IMPLANT
SUT VIC AB 2-0 CT1 27 (SUTURE) ×2
SUT VIC AB 2-0 CT1 TAPERPNT 27 (SUTURE) ×1 IMPLANT
SUT VIC AB 3-0 SH 8-18 (SUTURE) ×3 IMPLANT
SYR 30ML LL (SYRINGE) ×3 IMPLANT
TOWEL OR 17X24 6PK STRL BLUE (TOWEL DISPOSABLE) ×3 IMPLANT
TOWEL OR 17X26 10 PK STRL BLUE (TOWEL DISPOSABLE) ×3 IMPLANT
YANKAUER SUCT BULB TIP NO VENT (SUCTIONS) ×3 IMPLANT

## 2014-06-21 NOTE — Addendum Note (Signed)
Addendum  created 06/21/14 1229 by Scheryl Darter, CRNA   Modules edited: Charges VN

## 2014-06-21 NOTE — Evaluation (Signed)
Physical Therapy Evaluation Patient Details Name: Tamara Daugherty MRN: [id number] DOB: 1950-09-12 Today's Date: 06/21/2014   History of Present Illness  63 y.o. female admitted to [facility] on 06/21/14 for elective L TKA.  Pt with significant PMHx of TIA (affected memory for a short time), HTN, asthma, anxiety, back surgery, breast surgery, carpal tunnel surgery, and trigger thumb surgery.   Clinical Impression  Pt is POD #0 and is moving well with min assist in- room gait with RW.  She will likely progress well enough for d/c home with HHPT and her husband, Marshall's assist.   PT to follow acutely for deficits listed below.       Follow Up Recommendations Home health PT    Equipment Recommendations  Rolling walker with 5" wheels;3in1 (PT)    Recommendations for Other Services   NA    Precautions / Restrictions Precautions Precautions: Knee Precaution Booklet Issued: Yes (comment) Precaution Comments: knee handout given, WBAT status and KI use reviewed.  Required Braces or Orthoses: Knee Immobilizer - Left Knee Immobilizer - Left: On when out of bed or walking Restrictions Weight Bearing Restrictions: Yes LLE Weight Bearing: Weight bearing as tolerated      Mobility  Bed Mobility Overal bed mobility: Needs Assistance Bed Mobility: Supine to Sit     Supine to sit: Min guard     General bed mobility comments: Min guard assist to help progress left leg over EOB.   Transfers Overall transfer level: Needs assistance Equipment used: Rolling walker (2 wheeled) Transfers: Sit to/from Stand Sit to Stand: Min assist         General transfer comment: Min assist to support trunk during transition and stabilize RW.    Ambulation/Gait Ambulation/Gait assistance: Min assist Ambulation Distance (Feet): 15 Feet Assistive device: Rolling walker (2 wheeled) Gait Pattern/deviations: Step-to pattern;Antalgic     General Gait Details: Pt with moderately antalgic gait pattern.  Verbal  cues for safest LE sequencing.  Min assist to support trunk when stepping over left leg and for balance.          Balance Overall balance assessment: Needs assistance Sitting-balance support: Feet supported;No upper extremity supported Sitting balance-Leahy Scale: Good     Standing balance support: Bilateral upper extremity supported Standing balance-Leahy Scale: Poor                               Pertinent Vitals/Pain Pain Assessment: 0-10 Pain Score: 5  Pain Location: left knee Pain Descriptors / Indicators: Aching;Burning Pain Intervention(s): Limited activity within patient's tolerance;Monitored during session;Repositioned    Home Living Family/patient expects to be discharged to:: Private residence Living Arrangements: Spouse/significant other Available Help at Discharge: Family Type of Home: House Home Access: Stairs to enter Entrance Stairs-Rails: None Entrance Stairs-Number of Steps: 1 Home Layout: One level Home Equipment: Cane - single point;Shower seat Additional Comments: pt has handicap height commode.     Prior Function Level of Independence: Independent with assistive device(s)         Comments: used SPC for gait, driving, still working as a Industrial/product designer   Dominant Hand: Right    Extremity/Trunk Assessment   Upper Extremity Assessment: Defer to OT evaluation           Lower Extremity Assessment: LLE deficits/detail   LLE Deficits / Details: left leg with normal post op pain and weakness.  Pt with 3/5 ankle DF, 2/5 knee, 2+/5  hip flexion.   Cervical / Trunk Assessment: Other exceptions  Communication   Communication: No difficulties  Cognition Arousal/Alertness: Awake/alert Behavior During Therapy: WFL for tasks assessed/performed Overall Cognitive Status: Within Functional Limits for tasks assessed                         Exercises Total Joint Exercises Ankle Circles/Pumps: AROM;Both;20  reps;Supine      Assessment/Plan    PT Assessment Patient needs continued PT services  PT Diagnosis Difficulty walking;Abnormality of gait;Generalized weakness;Acute pain   PT Problem List Decreased strength;Decreased range of motion;Decreased activity tolerance;Decreased balance;Decreased mobility;Decreased knowledge of use of DME;Decreased knowledge of precautions;Pain  PT Treatment Interventions DME instruction;Gait training;Stair training;Functional mobility training;Therapeutic activities;Therapeutic exercise;Balance training;Neuromuscular re-education;Patient/family education;Manual techniques;Modalities   PT Goals (Current goals can be found in the Care Plan section) Acute Rehab PT Goals Patient Stated Goal: to go home PT Goal Formulation: With patient Time For Goal Achievement: 06/28/14 Potential to Achieve Goals: Good    Frequency 7X/week    End of Session Equipment Utilized During Treatment: Gait belt;Left knee immobilizer Activity Tolerance: Patient limited by fatigue;Patient limited by pain Patient left: in chair;with call bell/phone within reach;with family/visitor present Nurse Communication: Mobility status;Other (comment) (needs some cough drops)         Time: 4270-6237 PT Time Calculation (min) (ACUTE ONLY): 35 min   Charges:   PT Evaluation $Initial PT Evaluation Tier I: 1 Procedure PT Treatments $Therapeutic Activity: 8-22 mins        Ikram Riebe B. Khaleel Beckom, PT, DPT [phone]   06/21/2014, 5:54 PM

## 2014-06-21 NOTE — Transfer of Care (Signed)
**Note Tamara-Identified via Obfuscation** Immediate Anesthesia Transfer of Care Note  Patient: Tamara Daugherty  Procedure(s) Performed: Procedure(s): LEFT TOTAL KNEE ARTHROPLASTY (Left)  Patient Location: PACU  Anesthesia Type:General and Regional  Level of Consciousness: awake, alert , oriented and sedated  Airway & Oxygen Therapy: Patient Spontanous Breathing and Patient connected to face mask  Post-op Assessment: Report given to PACU RN, Post -op Vital signs reviewed and stable and Patient moving all extremities  Post vital signs: Reviewed and stable  Complications: No apparent anesthesia complications

## 2014-06-21 NOTE — Plan of Care (Signed)
Problem: Consults Goal: Diagnosis- Total Joint Replacement Primary Total Knee Left     

## 2014-06-21 NOTE — Progress Notes (Signed)
UR done  Frann Rider, Therapist, sports, BSN

## 2014-06-21 NOTE — Op Note (Signed)
**Note Tamara-Identified via Obfuscation** DATE OF SURGERY:  06/21/2014 TIME: 9:36 AM  PATIENT NAME:  Tamara Daugherty   AGE: 63 y.o.    PRE-OPERATIVE DIAGNOSIS:  DJD LEFT KNEE  POST-OPERATIVE DIAGNOSIS:  Same  PROCEDURE:  Procedure(s): LEFT TOTAL KNEE ARTHROPLASTY   SURGEON:  Johnny Bridge, MD   ASSISTANT:  Joya Gaskins, OPA-C, present and scrubbed throughout the case, critical for assistance with exposure, retraction, instrumentation, and closure.   OPERATIVE IMPLANTS: Depuy PFC Sigma, Posterior Stabilized.  Femur size 3, Tibia size 3, Patella size 38 3-peg oval button, with a 10 mm polyethylene insert.   PREOPERATIVE INDICATIONS:  Tamara Daugherty is a 63 y.o. year old female with end stage bone on bone degenerative arthritis of the knee who failed conservative treatment, including injections, antiinflammatories, activity modification, and assistive devices, and had significant impairment of their activities of daily living, and elected for Total Knee Arthroplasty.   The risks, benefits, and alternatives were discussed at length including but not limited to the risks of infection, bleeding, nerve injury, stiffness, blood clots, the need for revision surgery, cardiopulmonary complications, among others, and they were willing to proceed.  Operative findings in unique aspects of the case: There was fairly severe medial wear, and I had to cut the tibia twice. The lateral side was still somewhat more loose than the medial side after the bony resections were completed. The tibia was rotated over the medial tubercle, however placement of the trial polyethylene was somewhat difficult, possibly due to slight internal rotation of the tibia. The patella however tracked very well. The lateral compartment had some mild changes, and the patellofemoral groove joint had grade 2 and grade 3 changes although the majority of the disease was really medial. There was a fair amount of wear in the posterior medial compartment as well.  OPERATIVE  DESCRIPTION:  The patient was brought to the operative room and placed in a supine position.  General anesthesia was administered.  IV antibiotics were given in the form of 3 g of Ancef, with a test dose which she tolerated well..  The lower extremity was prepped and draped in the usual sterile fashion.  Time out was performed.  The leg was elevated and exsanguinated and the tourniquet was inflated.  Anterior quadriceps tendon splitting approach was performed.  The patella was everted and osteophytes were removed.  The anterior horn of the medial and lateral meniscus was removed.   The distal femur was opened with the drill and the intramedullary distal femoral cutting jig was utilized, set at 5 degrees resecting 10 mm off the distal femur.  Care was taken to protect the collateral ligaments.  Then the extramedullary tibial cutting jig was utilized making the appropriate cut using the anterior tibial crest as a reference building in appropriate posterior slope.  Care was taken during the cut to protect the medial and collateral ligaments.  The proximal tibia was removed along with the posterior horns of the menisci.  The PCL was sacrificed. I did have to cut the tibia twice.   The extensor gap was measured and was approximately 72mm.    The distal femoral sizing jig was applied, taking care to avoid notching.  Then the 4-in-1 cutting jig was applied and the anterior and posterior femur was cut, along with the chamfer cuts.  All posterior osteophytes were removed.  The flexion gap was then measured and was symmetric with the extension gap.  I completed the distal femoral preparation using the appropriate jig to prepare the box.  The patella was then measured, and cut with the saw.    The proximal tibia sized and prepared accordingly with the reamer and the punch, and then all components were trialed with the 21mm poly insert.  The knee was found to have excellent balance and full motion.    The  above named components were then cemented into place and all excess cement was removed.  The real polyethylene implant was placed. After the cement had cured I released the tourniquet and there was excellent hemostasis.  The knee was easily taken through a range of motion and the patella tracked well and the knee irrigated copiously and the parapatellar and subcutaneous tissue closed with vicryl, and monocryl with steri strips for the skin.  The wounds were injected with marcaine, and dressed with sterile gauze and the tourniquet released and the patient was awakened and returned to the PACU in stable and satisfactory condition.  There were no complications.  Total tourniquet time was 75 minutes.

## 2014-06-21 NOTE — Anesthesia Postprocedure Evaluation (Signed)
  Anesthesia Post-op Note  Patient: De Blanch  Procedure(s) Performed: Procedure(s): LEFT TOTAL KNEE ARTHROPLASTY (Left)  Patient Location: PACU  Anesthesia Type:General  Level of Consciousness: awake, alert  and oriented  Airway and Oxygen Therapy: Patient Spontanous Breathing and Patient connected to nasal cannula oxygen  Post-op Pain: mild  Post-op Assessment: Post-op Vital signs reviewed, Patient's Cardiovascular Status Stable, Respiratory Function Stable, Patent Airway and No signs of Nausea or vomiting  Post-op Vital Signs: Reviewed and stable  Last Vitals:  Filed Vitals:   06/21/14 1020  BP:   Pulse:   Temp: 36.4 C  Resp:     Complications: No apparent anesthesia complications

## 2014-06-21 NOTE — Anesthesia Procedure Notes (Addendum)
Anesthesia Regional Block:  Femoral nerve block  Pre-Anesthetic Checklist: ,, timeout performed, Correct Patient, Correct Site, Correct Laterality, Correct Procedure, Correct Position, site marked, Risks and benefits discussed,  Surgical consent,  Pre-op evaluation,  At surgeon's request and post-op pain management  Laterality: Left  Prep: Maximum Sterile Barrier Precautions used and chloraprep       Needles:  Injection technique: Single-shot  Needle Type: Echogenic Stimulator Needle     Needle Length: 10cm 10 cm Needle Gauge: 21 and 21 G    Additional Needles:  Procedures: ultrasound guided (picture in chart) and nerve stimulator Femoral nerve block  Nerve Stimulator or Paresthesia:  Response: 0.4 mA,   Additional Responses:   Narrative:  Injection made incrementally with aspirations every 5 mL.  Performed by: Personally  Anesthesiologist: Alexis Frock  Additional Notes: L femoral nerve block, [name] and stimulator, marcaine .5% 92ml, multiple asp, talked to patient throughout procedure, no complications, epi with marcaine,easy injection   Procedure Name: Intubation Date/Time: 06/21/2014 7:40 AM Performed by: Ebbie Latus E Pre-anesthesia Checklist: Patient identified, Timeout performed, Emergency Drugs available, Suction available and Patient being monitored Patient Re-evaluated:Patient Re-evaluated prior to inductionOxygen Delivery Method: Circle system utilized Preoxygenation: Pre-oxygenation with 100% oxygen Intubation Type: IV induction Ventilation: Mask ventilation without difficulty Laryngoscope Size: Mac and 3 Grade View: Grade I Tube type: Oral Tube size: 7.5 mm Number of attempts: 1 Airway Equipment and Method: Stylet Placement Confirmation: ETT inserted through vocal cords under direct vision,  positive ETCO2 and breath sounds checked- equal and bilateral Secured at: 23 cm Tube secured with: Tape Dental Injury: Teeth and Oropharynx as per pre-operative  assessment

## 2014-06-21 NOTE — H&P (Signed)
PREOPERATIVE H&P  Chief Complaint: DJD LEFT KNEE  HPI: Tamara Daugherty is a 63 y.o. female who presents for preoperative history and physical with a diagnosis of DJD LEFT KNEE. Symptoms are rated as moderate to severe, and have been worsening.  This is significantly impairing activities of daily living.  She has elected for surgical management. She has failed injections, activity modification, anti-inflammatories, and assistive devices.   Past Medical History  Diagnosis Date  . Complication of anesthesia     long time to wake up - referred to pulmonary after carpel tunnel procedure - for asthma and undiagnosed sleep apnea  . TIA (transient ischemic attack)     affected memory for short time  . Heart murmur     [facility] [place]  . Hypertension   . Sleep apnea     cpap  . Asthma     recently started symbicort -  [facility]  . Pneumonia     hx of  . Hypothyroidism   . Anxiety   . GERD (gastroesophageal reflux disease)     meds control  . Headache   . Arthritis   . History of blood transfusion     related to menstrual cycle   Past Surgical History  Procedure Laterality Date  . Tonsillectomy    . Abdominal hysterectomy    . Back surgery    . Knee arthroscopy    . Breast surgery      cyst removal  . Cardiac catheterization    . Trigger thumb    . Carpel tunnel Right    History   Social History  . Marital Status: Married    Spouse Name: N/A    Number of Children: N/A  . Years of Education: N/A   Social History Main Topics  . Smoking status: Never Smoker   . Smokeless tobacco: None  . Alcohol Use: Yes     Comment: social  . Drug Use: No  . Sexual Activity: None   Other Topics Concern  . None   Social History Narrative   History reviewed. No pertinent family history. Allergies  Allergen Reactions  . Bee Venom Anaphylaxis  . Penicillins Hives  . Sulfa Antibiotics Itching   Prior to Admission medications   Medication Sig Start Date End Date Taking?  Authorizing Provider  acetaminophen (TYLENOL) 500 MG tablet Take 1,000 mg by mouth every 4 (four) hours as needed.   Yes Historical Provider, MD  albuterol (PROVENTIL HFA;VENTOLIN HFA) 108 (90 BASE) MCG/ACT inhaler Inhale 2 puffs into the lungs every 6 (six) hours as needed for wheezing or shortness of breath.   Yes Historical Provider, MD  budesonide-formoterol (SYMBICORT) 80-4.5 MCG/ACT inhaler Inhale 2 puffs into the lungs 2 (two) times daily.   Yes Historical Provider, MD  CALCIUM-VITAMIN D PO Take 1 tablet by mouth daily.   Yes Historical Provider, MD  clopidogrel (PLAVIX) 75 MG tablet Take 75 mg by mouth daily.   Yes Historical Provider, MD  diclofenac sodium (VOLTAREN) 1 % GEL Apply 2 g topically 2 (two) times daily. Both knees and lower back   Yes Historical Provider, MD  diltiazem (MATZIM LA) 240 MG 24 hr tablet Take 240 mg by mouth daily.   Yes Historical Provider, MD  fluticasone (FLONASE) 50 MCG/ACT nasal spray Place 1 spray into both nostrils 2 (two) times daily as needed for allergies or rhinitis.   Yes Historical Provider, MD  levothyroxine (SYNTHROID, LEVOTHROID) 50 MCG tablet Take 50 mcg by mouth daily before  breakfast.   Yes Historical Provider, MD  Multiple Vitamins-Minerals (MULTIVITAMIN WITH MINERALS) tablet Take 1 tablet by mouth daily.   Yes Historical Provider, MD  olmesartan-hydrochlorothiazide (BENICAR HCT) 40-25 MG per tablet Take 1 tablet by mouth daily.   Yes Historical Provider, MD  Omega-3 Fatty Acids (FISH OIL PO) Take 1 capsule by mouth 2 (two) times daily.   Yes Historical Provider, MD  pantoprazole (PROTONIX) 40 MG tablet Take 40 mg by mouth daily.   Yes Historical Provider, MD  potassium chloride (MICRO-K) 10 MEQ CR capsule Take 30 mEq by mouth 2 (two) times daily.   Yes Historical Provider, MD  pyridOXINE (B-6) 50 MG tablet Take 50 mg by mouth daily.   Yes Historical Provider, MD  simvastatin (ZOCOR) 20 MG tablet Take 20 mg by mouth daily.   Yes Historical  Provider, MD  torsemide (DEMADEX) 20 MG tablet Take 20 mg by mouth daily.   Yes Historical Provider, MD  EPINEPHrine 0.3 mg/0.3 mL IJ SOAJ injection Inject 0.3 mg into the muscle once.    Historical Provider, MD  guaiFENesin (MUCINEX) 600 MG 12 hr tablet Take 600 mg by mouth 2 (two) times daily as needed for to loosen phlegm.    Historical Provider, MD  hydrocortisone 2.5 % cream Apply 1 application topically 2 (two) times daily as needed (rash).    Historical Provider, MD  hydrOXYzine (ATARAX/VISTARIL) 25 MG tablet Take 25 mg by mouth every 8 (eight) hours as needed for itching.    Historical Provider, MD  loratadine (CLARITIN) 10 MG tablet Take 10 mg by mouth daily as needed for allergies.    Historical Provider, MD  meclizine (ANTIVERT) 25 MG tablet Take 25 mg by mouth 4 (four) times daily as needed for dizziness.    Historical Provider, MD  nystatin cream (MYCOSTATIN) Apply 1 application topically 2 (two) times daily as needed (ffected area).    Historical Provider, MD     Positive ROS: All other systems have been reviewed and were otherwise negative with the exception of those mentioned in the HPI and as above.  Physical Exam: General: Alert, no acute distress Cardiovascular: No pedal edema Respiratory: No cyanosis, no use of accessory musculature GI: No organomegaly, abdomen is soft and non-tender Skin: No lesions in the area of chief complaint Neurologic: Sensation intact distally Psychiatric: Patient is competent for consent with normal mood and affect Lymphatic: No axillary or cervical lymphadenopathy  MUSCULOSKELETAL: left knee rom 0-100 deg with crepitance and diffuse pain.  XR with end stage DJD multiple compartments, osteophyte formation, varus, loss of joint space.  Assessment: DJD LEFT KNEE  Plan: Plan for Procedure(s): LEFT TOTAL KNEE ARTHROPLASTY  The risks benefits and alternatives were discussed with the patient including but not limited to the risks of  nonoperative treatment, versus surgical intervention including infection, bleeding, nerve injury,  blood clots, cardiopulmonary complications, morbidity, mortality, among others, and they were willing to proceed.   Johnny Bridge, MD Cell [phone]   06/21/2014 7:16 AM

## 2014-06-22 ENCOUNTER — Encounter (HOSPITAL_COMMUNITY): Payer: Self-pay | Admitting: Orthopedic Surgery

## 2014-06-22 DIAGNOSIS — E871 Hypo-osmolality and hyponatremia: Secondary | ICD-10-CM | POA: Diagnosis not present

## 2014-06-22 DIAGNOSIS — D62 Acute posthemorrhagic anemia: Secondary | ICD-10-CM | POA: Diagnosis not present

## 2014-06-22 LAB — BASIC METABOLIC PANEL
Anion gap: 7 (ref 5–15)
BUN: 13 mg/dL (ref 6–23)
CO2: 27 mmol/L (ref 19–32)
Calcium: 8.3 mg/dL — ABNORMAL LOW (ref 8.4–10.5)
Chloride: 92 mEq/L — ABNORMAL LOW (ref 96–112)
Creatinine, Ser: 1.27 mg/dL — ABNORMAL HIGH (ref 0.50–1.10)
GFR calc Af Amer: 52 mL/min — ABNORMAL LOW (ref >90)
GFR calc non Af Amer: 45 mL/min — ABNORMAL LOW (ref >90)
Glucose, Bld: 106 mg/dL — ABNORMAL HIGH (ref 70–99)
Potassium: 3.5 mmol/L (ref 3.5–5.1)
Sodium: 126 mmol/L — ABNORMAL LOW (ref 135–145)

## 2014-06-22 LAB — CBC
HCT: 27.8 % — ABNORMAL LOW (ref 36.0–46.0)
Hemoglobin: 9.4 g/dL — ABNORMAL LOW (ref 12.0–15.0)
MCH: 31 pg (ref 26.0–34.0)
MCHC: 33.8 g/dL (ref 30.0–36.0)
MCV: 91.7 fL (ref 78.0–100.0)
Platelets: 253 10*3/uL (ref 150–400)
RBC: 3.03 MIL/uL — ABNORMAL LOW (ref 3.87–5.11)
RDW: 14.5 % (ref 11.5–15.5)
WBC: 9.5 10*3/uL (ref 4.0–10.5)

## 2014-06-22 NOTE — Discharge Instructions (Signed)
Diet: As you were doing prior to hospitalization   Shower:  May shower but keep the wounds dry, use an occlusive plastic wrap, NO SOAKING IN TUB.  If the bandage gets wet, change with a clean dry gauze.  Dressing:  You may change your dressing 3-5 days after surgery.  Then change the dressing daily with sterile gauze dressing.    There are sticky tapes (steri-strips) on your wounds and all the stitches are absorbable.  Leave the steri-strips in place when changing your dressings, they will peel off with time, usually 2-3 weeks.  Activity:  Increase activity slowly as tolerated, but follow the weight bearing instructions below.  No lifting or driving for 6 weeks.  Weight Bearing:   As tolerated.    To prevent constipation: you may use a stool softener such as -  Colace (over the counter) 100 mg by mouth twice a day  Drink plenty of fluids (prune juice may be helpful) and high fiber foods Miralax (over the counter) for constipation as needed.    Itching:  If you experience itching with your medications, try taking only a single pain pill, or even half a pain pill at a time.  You may take up to 10 pain pills per day, and you can also use benadryl over the counter for itching or also to help with sleep.   Precautions:  If you experience chest pain or shortness of breath - call 911 immediately for transfer to the hospital emergency department!!  If you develop a fever greater that 101 F, purulent drainage from wound, increased redness or drainage from wound, or calf pain -- Call the office at [phone]                                                Follow- Up Appointment:  Please call for an appointment to be seen in 2 weeks [place] - [phone]      Information on my medicine - XARELTO (Rivaroxaban)  This medication education was reviewed with me or my healthcare representative as part of my discharge preparation.  The pharmacist that spoke with me during my hospital stay was:   Wayland Salinas, [facility]  Why was Xarelto prescribed for you? Xarelto was prescribed for you to reduce the risk of blood clots forming after orthopedic surgery. The medical term for these abnormal blood clots is venous thromboembolism (VTE).  What do you need to know about xarelto ? Take your Xarelto ONCE DAILY at the same time every day. You may take it either with or without food.  If you have difficulty swallowing the tablet whole, you may crush it and mix in applesauce just prior to taking your dose.  Take Xarelto exactly as prescribed by your doctor and DO NOT stop taking Xarelto without talking to the doctor who prescribed the medication.  Stopping without other VTE prevention medication to take the place of Xarelto may increase your risk of developing a clot.  After discharge, you should have regular check-up appointments with your healthcare provider that is prescribing your Xarelto.    What do you do if you miss a dose? If you miss a dose, take it as soon as you remember on the same day then continue your regularly scheduled once daily regimen the next day. Do not take two doses of Xarelto on the same day.  Important Safety Information A possible side effect of Xarelto is bleeding. You should call your healthcare provider right away if you experience any of the following: ? Bleeding from an injury or your nose that does not stop. ? Unusual colored urine (red or dark brown) or unusual colored stools (red or black). ? Unusual bruising for unknown reasons. ? A serious fall or if you hit your head (even if there is no bleeding).  Some medicines may interact with Xarelto and might increase your risk of bleeding while on Xarelto. To help avoid this, consult your healthcare provider or pharmacist prior to using any new prescription or non-prescription medications, including herbals, vitamins, non-steroidal anti-inflammatory drugs (NSAIDs) and supplements.  This  website has more information on Xarelto: [email or web address]

## 2014-06-22 NOTE — Progress Notes (Signed)
Physical Therapy Treatment Patient Details Name: Tamara Daugherty MRN: [id number] DOB: 04-Jul-1950 Today's Date: 06/22/2014    History of Present Illness 63 y.o. female admitted to [facility] on 06/21/14 for elective L TKA.  Pt with significant PMHx of TIA (affected memory for a short time), HTN, asthma, anxiety, back surgery, breast surgery, carpal tunnel surgery, and trigger thumb surgery.     PT Comments    Pt is progressing well with mobility.  She is limited by nausea, but continues to work with and try to progress her moving.  Knee exercises initiated and plan this afternoon to ambulate into the hallway.  PT will continue to follow acutely.   Follow Up Recommendations  Home health PT     Equipment Recommendations  Rolling walker with 5" wheels;3in1 (PT)    Recommendations for Other Services   NA     Precautions / Restrictions Precautions Precautions: Knee Required Braces or Orthoses: Knee Immobilizer - Left Knee Immobilizer - Left: On when out of bed or walking Restrictions Weight Bearing Restrictions: Yes LLE Weight Bearing: Weight bearing as tolerated    Mobility  Bed Mobility Overal bed mobility: Needs Assistance Bed Mobility: Supine to Sit     Supine to sit: Supervision Sit to supine: Min assist;HOB elevated   General bed mobility comments: Supervision for safety, verbal cues for hand placement.   Transfers Overall transfer level: Needs assistance Equipment used: Rolling walker (2 wheeled) Transfers: Sit to/from Stand Sit to Stand: Min guard         General transfer comment: Min guard assist for safety and verbal cues for safe hand placement.   Ambulation/Gait Ambulation/Gait assistance: Min guard Ambulation Distance (Feet): 15 Feet Assistive device: Rolling walker (2 wheeled) Gait Pattern/deviations: Step-to pattern;Antalgic;Trunk flexed     General Gait Details: Moderately antalgic gait pattern, tends to flex at trunk when stepping onto left leg.  Verbal  cues for safe use of RW.        Balance Overall balance assessment: Needs assistance Sitting-balance support: Feet supported;No upper extremity supported Sitting balance-Leahy Scale: Good     Standing balance support: Bilateral upper extremity supported;No upper extremity supported;Single extremity supported Standing balance-Leahy Scale: Fair                      Cognition Arousal/Alertness: Awake/alert Behavior During Therapy: WFL for tasks assessed/performed Overall Cognitive Status: Within Functional Limits for tasks assessed                      Exercises Total Joint Exercises Ankle Circles/Pumps: AROM;Both;20 reps;Supine Quad Sets: AROM;Left;10 reps;Supine Towel Squeeze: AROM;Both;10 reps;Supine Heel Slides: AAROM;Left;10 reps;Supine        Pertinent Vitals/Pain Pain Assessment: 0-10 Pain Score: 4  Faces Pain Scale: Hurts little more Pain Location: left knee Pain Descriptors / Indicators: Aching;Burning Pain Intervention(s): Limited activity within patient's tolerance;Monitored during session;Repositioned    Home Living Family/patient expects to be discharged to:: Private residence Living Arrangements: Spouse/significant other Available Help at Discharge: Family Type of Home: House Home Access: Stairs to enter Entrance Stairs-Rails: None Home Layout: One level Home Equipment: Cane - single point;Shower seat      Prior Function Level of Independence: Independent with assistive device(s)      Comments: used SPC for gait, driving, still working as a Animal nutritionist   PT Goals (current goals can now be found in the care plan section) Acute Rehab PT Goals Patient Stated Goal: to go home Progress towards PT goals:  Progressing toward goals    Frequency  7X/week    PT Plan Current plan remains appropriate       End of Session Equipment Utilized During Treatment: Left knee immobilizer Activity Tolerance: Patient limited by  fatigue;Patient limited by pain;Other (comment) (limited by nausea) Patient left: in chair;with call bell/phone within reach;with family/visitor present     Time: 1213-1242 PT Time Calculation (min) (ACUTE ONLY): 29 min  Charges:  $Gait Training: 8-22 mins $Therapeutic Exercise: 8-22 mins                      Ladye Macnaughton B. Corynn Solberg, PT, DPT [phone]   06/22/2014, 12:47 PM

## 2014-06-22 NOTE — Progress Notes (Signed)
Patient was having issue with home nasal pillows attaching to our tubing.  Secured with connector and tape.  Patient's BBS showed good air flow.  SPO2 100%.

## 2014-06-22 NOTE — Evaluation (Signed)
Occupational Therapy Evaluation Patient Details Name: Tamara Daugherty MRN: [id number] DOB: August 18, 1950 Today's Date: 06/22/2014    History of Present Illness 63 y.o. female admitted to [facility] on 06/21/14 for elective L TKA.  Pt with significant PMHx of TIA (affected memory for a short time), HTN, asthma, anxiety, back surgery, breast surgery, carpal tunnel surgery, and trigger thumb surgery.    Clinical Impression   Pt was modified independent in ADL prior to admission, driving and working.  Pt presents with generalized weakness, L knee pain and impaired balance impacting ability to perform self care and ADL transfers.  Began instruction in use of AE for LB bathing and dressing.  Session limited today by pt having nausea and vomiting requiring return to bed.  Will continue to follow.    Follow Up Recommendations  No OT follow up;Supervision/Assistance - 24 hour    Equipment Recommendations  None recommended by OT    Recommendations for Other Services       Precautions / Restrictions Precautions Precautions: Knee Required Braces or Orthoses: Knee Immobilizer - Left Knee Immobilizer - Left: On when out of bed or walking Restrictions Weight Bearing Restrictions: Yes LLE Weight Bearing: Weight bearing as tolerated      Mobility Bed Mobility Overal bed mobility: Needs Assistance Bed Mobility: Supine to Sit;Sit to Supine     Supine to sit: Min guard;HOB elevated Sit to supine: Min assist;HOB elevated   General bed mobility comments: assisted L LE back into bed, use of rail  Transfers Overall transfer level: Needs assistance Equipment used: Rolling walker (2 wheeled) Transfers: Sit to/from Stand Sit to Stand: Min assist         General transfer comment: stood from bed and immediately returned due to nausea    Balance                                            ADL Overall ADL's : Needs assistance/impaired Eating/Feeding: Independent;Bed level   Grooming:  Wash/dry face;Sitting;Set up   Upper Body Bathing: Set up;Sitting   Lower Body Bathing: Minimal assistance;Sit to/from stand   Upper Body Dressing : Set up;Sitting   Lower Body Dressing: Minimal assistance;Sit to/from stand                 General ADL Comments: Demonstrated use of AE, but did not practice. Session limited by pt's nausea and vomiting.     Vision                     Perception     Praxis      Pertinent Vitals/Pain Pain Assessment: Faces Faces Pain Scale: Hurts little more Pain Location: L knee Pain Descriptors / Indicators: Aching Pain Intervention(s): Monitored during session;Premedicated before session;Repositioned     Hand Dominance Right   Extremity/Trunk Assessment Upper Extremity Assessment Upper Extremity Assessment: Overall WFL for tasks assessed   Lower Extremity Assessment Lower Extremity Assessment: Defer to PT evaluation   Cervical / Trunk Assessment Cervical / Trunk Exceptions: h/o low back surgery   Communication Communication Communication: No difficulties   Cognition Arousal/Alertness: Awake/alert Behavior During Therapy: WFL for tasks assessed/performed Overall Cognitive Status: Within Functional Limits for tasks assessed                     General Comments       Exercises  Shoulder Instructions      Home Living Family/patient expects to be discharged to:: Private residence Living Arrangements: Spouse/significant other Available Help at Discharge: Family Type of Home: House Home Access: Stairs to enter Technical brewer of Steps: 1 Entrance Stairs-Rails: None Home Layout: One level     Bathroom Shower/Tub: Occupational psychologist: Handicapped height     Home Equipment: [place] - single point;Shower seat          Prior Functioning/Environment Level of Independence: Independent with assistive device(s)        Comments: used SPC for gait, driving, still working as a  Animal nutritionist    OT Diagnosis: Generalized weakness;Acute pain   OT Problem List: Decreased strength;Decreased activity tolerance;Impaired balance (sitting and/or standing);Decreased safety awareness;Obesity;Pain   OT Treatment/Interventions: Self-care/ADL training;DME and/or AE instruction;Patient/family education    OT Goals(Current goals can be found in the care plan section) Acute Rehab OT Goals Patient Stated Goal: to go home OT Goal Formulation: With patient Time For Goal Achievement: 06/29/14 Potential to Achieve Goals: Good ADL Goals Pt Will Perform Grooming: with supervision;standing Pt Will Perform Lower Body Dressing: with supervision;with adaptive equipment;sit to/from stand Pt Will Transfer to Toilet: with supervision;ambulating (comfort height) Pt Will Perform Toileting - Clothing Manipulation and hygiene: sit to/from stand Pt Will Perform Tub/Shower Transfer: Shower transfer;with supervision;ambulating;shower seat;rolling walker  OT Frequency: Min 2X/week   Barriers to D/C:            Co-evaluation              End of Session Equipment Utilized During Treatment: Rolling walker;Left knee immobilizer Nurse Communication: Other (comment) (aware of pt's N/V)  Activity Tolerance: Treatment limited secondary to medical complications (Comment) (N/V) Patient left: in chair;with call bell/phone within reach   Time: 5631-4970 OT Time Calculation (min): 17 min Charges:  OT General Charges $OT Visit: 1 Procedure OT Evaluation $Initial OT Evaluation Tier I: 1 Procedure OT Treatments $Self Care/Home Management : 8-22 mins G-Codes:    Malka So 06/22/2014, 10:55 AM  [phone]

## 2014-06-22 NOTE — Progress Notes (Signed)
Pt placed on CPAP QHS via her nasal pillows connected to our tubing and machine with 2 Lpm of O2 bled into tubing.  Machine plugged into red outlet and humidifier filled with sterile water.  Pt comfortable and stable.

## 2014-06-22 NOTE — Progress Notes (Deleted)
CARE MANAGEMENT NOTE 06/22/2014  Patient:  Tamara Daugherty   Account Number:  [id number]  Date Initiated:  06/22/2014  Documentation initiated by:  [facility]  Subjective/Objective Assessment:   s/p rt TKA     Action/Plan:   PT/OT evals- recommended HHPT   Anticipated DC Date:  06/23/2014   Anticipated DC Plan:  [place]  CM consult      [facility] Choice  HOME HEALTH   Choice offered to / List presented to:  C-1 Patient        [place] arranged  [place] [facility]      [place].   Status of service:  Completed, signed off Medicare Important Message given?   (If response is "NO", the following Medicare IM given date fields will be blank) Date Medicare IM given:   Medicare IM given by:   Date Additional Medicare IM given:   Additional Medicare IM given by:    Discharge Disposition:  [place]  Per UR Regulation:  Reviewed for med. necessity/level of care/duration of stay  If discussed at [place] of Stay Meetings, dates discussed:    Comments:  06/22/14 Spoke with patient and his wife about HHC. They selected [facility]. Patient states that he has a rolling walker and does not need a 3N1. Contacted Miranda at [facility] and set up [place] per patient's request. No other d/c needs identified.

## 2014-06-22 NOTE — Progress Notes (Signed)
Physical Therapy Treatment Patient Details Name: Tamara Daugherty MRN: [id number] DOB: [birth year] Today's Date: 06/22/2014    History of Present Illness 63 y.o. female admitted to [facility] on 06/21/14 for elective L TKA.  Pt with significant PMHx of TIA (affected memory for a short time), HTN, asthma, anxiety, back surgery, breast surgery, carpal tunnel surgery, and trigger thumb surgery.     PT Comments    Pt is progressing with her mobility despite nausea and dry heaves during this PM session.  She was able to progress gait into the hallway and progress her leg exercises.  We will need to practice bed mobility with HOB flat, and no rails as well as one STE no rails before discharge.  PT will continue to follow acutely.   Follow Up Recommendations  Home health PT     Equipment Recommendations  Rolling walker with 5" wheels;3in1 (PT)    Recommendations for Other Services   NA     Precautions / Restrictions Precautions Precautions: Knee Required Braces or Orthoses: Knee Immobilizer - Left Knee Immobilizer - Left: On when out of bed or walking Restrictions LLE Weight Bearing: Weight bearing as tolerated    Mobility  Bed Mobility Overal bed mobility: Needs Assistance Bed Mobility: Supine to Sit     Supine to sit: Modified independent (Device/Increase time)     General bed mobility comments: Pt able to progress left leg over EOB, using bed rails and HOB elevated.  Will need to practice HOB flat and no rails prior to d/c  Transfers Overall transfer level: Needs assistance Equipment used: Rolling walker (2 wheeled) Transfers: Sit to/from Stand Sit to Stand: Min assist         General transfer comment: Min assist to support trunk during transitions from bed.  Pt able to get off of higher BSC with min guard assist to stabilize RW.   Ambulation/Gait Ambulation/Gait assistance: Min guard Ambulation Distance (Feet): 45 Feet Assistive device: Rolling walker (2 wheeled) Gait  Pattern/deviations: Step-to pattern;Antalgic;Trunk flexed Gait velocity: decreased Gait velocity interpretation: Below normal speed for age/gender General Gait Details: Moderately antalgic gait pattern, flexed posture.  Verbal cues for upright posture and safe RW use          Balance Overall balance assessment: Needs assistance Sitting-balance support: Feet supported;No upper extremity supported Sitting balance-Leahy Scale: Good     Standing balance support: Bilateral upper extremity supported Standing balance-Leahy Scale: Fair                      Cognition Arousal/Alertness: Awake/alert Behavior During Therapy: WFL for tasks assessed/performed Overall Cognitive Status: Within Functional Limits for tasks assessed                      Exercises Total Joint Exercises Long Arc Quad: AROM;Left;10 reps;Seated Knee Flexion: AROM;AAROM;Left;10 reps;Seated        Pertinent Vitals/Pain Pain Assessment: 0-10 Pain Score: 8  Pain Location: left knee Pain Descriptors / Indicators: Aching;Burning Pain Intervention(s): Limited activity within patient's tolerance;Monitored during session;Repositioned           PT Goals (current goals can now be found in the care plan section) Acute Rehab PT Goals Patient Stated Goal: to go home Progress towards PT goals: Progressing toward goals    Frequency  7X/week    PT Plan Current plan remains appropriate       End of Session Equipment Utilized During Treatment: Left knee immobilizer Activity Tolerance: Patient limited by pain;Other (  comment) (limited by nausea) Patient left: in chair;with call bell/phone within reach;with family/visitor present     Time: 1711-1745 PT Time Calculation (min) (ACUTE ONLY): 34 min  Charges:  $Gait Training: 8-22 mins $Therapeutic Exercise: 8-22 mins                      Alegandro Macnaughton B. Craig, [place], DPT [phone]   06/22/2014, 5:54 PM

## 2014-06-22 NOTE — Progress Notes (Signed)
CARE MANAGEMENT NOTE 06/22/2014  Patient:  [facility]   Account Number:  [id number]  Date Initiated:  06/22/2014  Documentation initiated by:  [facility]  Subjective/Objective Assessment:   s/p left TKA     Action/Plan:   PT/OT evals- recommended HHPT   Anticipated DC Date:  06/23/2014   Anticipated DC Plan:  [place]  CM consult      [facility] Choice  [place]   Choice offered to / List presented to:  C-1 Patient   DME arranged  3-N-1  Vassie Moselle      DME agency  [facility] arranged  [place]      [facility] agency  [facility]   Status of service:  Completed, signed off Medicare Important Message given?   (If response is "NO", the following Medicare IM given date fields will be blank) Date Medicare IM given:   Medicare IM given by:   Date Additional Medicare IM given:   Additional Medicare IM given by:    Discharge Disposition:  [place]  Per UR Regulation:  Reviewed for med. necessity/level of care/duration of stay  If discussed at [place] of Stay Meetings, dates discussed:    Comments:  06/22/14 Spoke with patient about [facility], she selected [facility] in [place], [phone]. Contacted [facility], spoke with Jenny Reichmann, set up HHPT they will be able to start with the patient on 06/29/14.Checked with several other agencies in her area, none able to service HHPT prior to 06/29/14. Contacted Dr. Luanna Cole office and informed Dr. Mardelle Matte that [facility] will start on 06/29/14. Contacted Frank with [facility] and requested rolling walker and 3N1 be delivered to patient's room. Faxed facesheet, HHPT order, H and P, op note and progress note to Daryel November [phone]. Received confirmation. Will continue to follow. Fuller Plan RN, BSN, CCM

## 2014-06-22 NOTE — Progress Notes (Signed)
Patient ID: Tamara Daugherty, female   DOB: 22-Dec-1950, 63 y.o.   MRN: [id number]     Subjective:  Patient reports pain as mild to moderate.  Patient in bed and denies any CP or SOB   Objective:   VITALS:   Filed Vitals:   06/21/14 2346 06/22/14 0126 06/22/14 0400 06/22/14 0630  BP:  103/60  112/75  Pulse:  73  75  Temp:  98.1 F (36.7 C)  98 F (36.7 C)  TempSrc:  Oral  Oral  Resp: 17  16   Height:      Weight:      SpO2: 100% 100% 100% 93%    ABD soft Sensation intact distally Dorsiflexion/Plantar flexion intact Incision: dressing C/D/I and no drainage Good foot and ankle motion and sensation intact through out the whole foot  Lab Results  Component Value Date   WBC 9.5 06/22/2014   HGB 9.4* 06/22/2014   HCT 27.8* 06/22/2014   MCV 91.7 06/22/2014   PLT 253 06/22/2014   BMET    Component Value Date/Time   NA 126* 06/22/2014 0650   K 3.5 06/22/2014 0650   CL 92* 06/22/2014 0650   CO2 27 06/22/2014 0650   GLUCOSE 106* 06/22/2014 0650   BUN 13 06/22/2014 0650   CREATININE 1.27* 06/22/2014 0650   CALCIUM 8.3* 06/22/2014 0650   GFRNONAA 45* 06/22/2014 0650   GFRAA 52* 06/22/2014 0650     Assessment/Plan: 1 Day Post-Op   Principal Problem:   Primary localized osteoarthritis of left knee Active Problems:   Severe obesity (BMI >= 40)   Knee osteoarthritis hyponatremia, will observe, asymptomatic Mild renal insufficiency, observe Mild abla, monitor  Advance diet Up with therapy Plan for discharge tomorrow  WBAT Dry dressing PRN    Tamara Daugherty 06/22/2014, 8:03 AM  Seen and agree  Tamara Bond, MD Cell [phone]

## 2014-06-23 LAB — CBC
HCT: 27.5 % — ABNORMAL LOW (ref 36.0–46.0)
Hemoglobin: 9.1 g/dL — ABNORMAL LOW (ref 12.0–15.0)
MCH: 29.6 pg (ref 26.0–34.0)
MCHC: 33.1 g/dL (ref 30.0–36.0)
MCV: 89.6 fL (ref 78.0–100.0)
Platelets: 256 10*3/uL (ref 150–400)
RBC: 3.07 MIL/uL — ABNORMAL LOW (ref 3.87–5.11)
RDW: 14.4 % (ref 11.5–15.5)
WBC: 9.1 10*3/uL (ref 4.0–10.5)

## 2014-06-23 LAB — BASIC METABOLIC PANEL
Anion gap: 7 (ref 5–15)
BUN: 11 mg/dL (ref 6–23)
CO2: 28 mmol/L (ref 19–32)
Calcium: 8.7 mg/dL (ref 8.4–10.5)
Chloride: 94 mEq/L — ABNORMAL LOW (ref 96–112)
Creatinine, Ser: 1.28 mg/dL — ABNORMAL HIGH (ref 0.50–1.10)
GFR calc Af Amer: 51 mL/min — ABNORMAL LOW (ref >90)
GFR calc non Af Amer: 44 mL/min — ABNORMAL LOW (ref >90)
Glucose, Bld: 113 mg/dL — ABNORMAL HIGH (ref 70–99)
Potassium: 3.8 mmol/L (ref 3.5–5.1)
Sodium: 129 mmol/L — ABNORMAL LOW (ref 135–145)

## 2014-06-23 NOTE — Progress Notes (Signed)
Patient ID: Tamara Daugherty, female   DOB: 04/11/1951, 63 y.o.   MRN: [id number]     Subjective:  Patient reports pain as mild.  Patient sitting up in the chair and in no acute distress.  Denies any CP or SOB  Objective:   VITALS:   Filed Vitals:   06/22/14 2003 06/22/14 2200 06/23/14 0000 06/23/14 0500  BP:  113/53  101/51  Pulse:  94  82  Temp:  99.4 F (37.4 C)  99.2 F (37.3 C)  TempSrc:      Resp:  16 18 16   Height:      Weight:      SpO2: 91% 93%  95%    ABD soft Sensation intact distally Dorsiflexion/Plantar flexion intact Incision: dressing C/D/I and no drainage Dressing removed and wound clean and dry no sign of infection Dry dressing applied  Lab Results  Component Value Date   WBC 9.1 06/23/2014   HGB 9.1* 06/23/2014   HCT 27.5* 06/23/2014   MCV 89.6 06/23/2014   PLT 256 06/23/2014   BMET    Component Value Date/Time   NA 129* 06/23/2014 0509   K 3.8 06/23/2014 0509   CL 94* 06/23/2014 0509   CO2 28 06/23/2014 0509   GLUCOSE 113* 06/23/2014 0509   BUN 11 06/23/2014 0509   CREATININE 1.28* 06/23/2014 0509   CALCIUM 8.7 06/23/2014 0509   GFRNONAA 44* 06/23/2014 0509   GFRAA 51* 06/23/2014 0509     Assessment/Plan: 2 Days Post-Op   Principal Problem:   Primary localized osteoarthritis of left knee Active Problems:   Severe obesity (BMI >= 40)   Knee osteoarthritis   Hyponatremia   Postoperative anemia due to acute blood loss   Advance diet Up with therapy Discharge home with home health  ABLA stable WBAT Dry dressing PRN Follow up with Dr Mardelle Matte in one to two weeks    [place] 06/23/2014, 8:40 AM  Discussed and agree.  Hyponatremia improved, creatinine ok.    Marchia Bond, MD Cell [phone]

## 2014-06-23 NOTE — Progress Notes (Signed)
Physical Therapy Treatment Patient Details Name: Tamara Daugherty MRN: [id number] DOB: 12/15/1950 Today's Date: 06/23/2014    History of Present Illness 63 y.o. female admitted to [facility] on 06/21/14 for elective L TKA.  Pt with significant PMHx of TIA (affected memory for a short time), HTN, asthma, anxiety, back surgery, breast surgery, carpal tunnel surgery, and trigger thumb surgery.     PT Comments    Pt is progressing well with her mobility despite continued reports of lightheadedness in standing and with gait (her BPs have been soft).  Pt was able to complete stair training with minimal assist and husband present for education on LE sequencing.  Pt ready for d/c home from a mobility standpoint.  PT will follow acutely until d/c.    Follow Up Recommendations  Home health PT     Equipment Recommendations  Rolling walker with 5" wheels;3in1 (PT)    Recommendations for Other Services   NA     Precautions / Restrictions Precautions Precautions: Knee Required Braces or Orthoses: Knee Immobilizer - Left Knee Immobilizer - Left: On when out of bed or walking Restrictions LLE Weight Bearing: Weight bearing as tolerated    Mobility                    Transfers Overall transfer level: Needs assistance Equipment used: Rolling walker (2 wheeled) Transfers: Sit to/from Stand Sit to Stand: Supervision         General transfer comment: supervision for safety, pt flexes forward and relies heavily on her upper extremities for support during transitions.   Ambulation/Gait Ambulation/Gait assistance: Min guard Ambulation Distance (Feet): 85 Feet Assistive device: Rolling walker (2 wheeled) Gait Pattern/deviations: Step-to pattern;Antalgic;Trunk flexed Gait velocity: decreased Gait velocity interpretation: Below normal speed for age/gender General Gait Details: Pt with antlagic gait pattern.  She needs verbal cues for upright posture.  Slow speed.  Min guard assist for safety due  to abnormal gait pattern and heavy reliance on hands.    Stairs Stairs: Yes Stairs assistance: Min assist Stair Management: No rails;Forwards;Step to pattern;With walker Number of Stairs: 1 General stair comments: Verbal cues for sequencing and min assist to support trunk during transitions and help pt lift RW.          Balance Overall balance assessment: Needs assistance Sitting-balance support: Feet supported;No upper extremity supported Sitting balance-Leahy Scale: Good     Standing balance support: Bilateral upper extremity supported Standing balance-Leahy Scale: Fair                      Cognition Arousal/Alertness: Awake/alert Behavior During Therapy: WFL for tasks assessed/performed Overall Cognitive Status: Within Functional Limits for tasks assessed                      Exercises Total Joint Exercises Long Arc Quad: AROM;Left;10 reps;Seated Knee Flexion: AROM;AAROM;Left;10 reps;Seated        Pertinent Vitals/Pain Pain Assessment: 0-10 Pain Score: 7  Pain Location: left knee Pain Descriptors / Indicators: Aching;Burning Pain Intervention(s): Limited activity within patient's tolerance;Monitored during session;Repositioned           PT Goals (current goals can now be found in the care plan section) Acute Rehab PT Goals Patient Stated Goal: walk on my own again Progress towards PT goals: Progressing toward goals    Frequency  7X/week    PT Plan Current plan remains appropriate       End of Session Equipment Utilized During Treatment: Left  knee immobilizer Activity Tolerance: Patient limited by fatigue;Patient limited by pain Patient left: in bed;Other (comment) (seated EOB)     Time: 5784-6962 PT Time Calculation (min) (ACUTE ONLY): 74 min  Charges:  $Gait Training: 8-22 mins $Therapeutic Exercise: 8-22 mins $Therapeutic Activity: 23-37 mins (did not charge for ~10 mins due to toileting)                      Wells Guiles B.  Christol Thetford, PT, DPT [phone]   06/23/2014, 3:40 PM

## 2014-06-23 NOTE — Discharge Summary (Signed)
Physician Discharge Summary  Patient ID: Tamara Daugherty MRN: [id number] DOB/AGE: [birth year] 63 y.o.  Admit date: 06/21/2014 Discharge date: 06/23/2014  Admission Diagnoses:  Primary localized osteoarthritis of left knee  Discharge Diagnoses:  Principal Problem:   Primary localized osteoarthritis of left knee Active Problems:   Severe obesity (BMI >= 40)   Knee osteoarthritis   Hyponatremia   Postoperative anemia due to acute blood loss   Past Medical History  Diagnosis Date  . Complication of anesthesia     long time to wake up - referred to pulmonary after carpel tunnel procedure - for asthma and undiagnosed sleep apnea  . TIA (transient ischemic attack)     affected memory for short time  . Heart murmur     [facility] [place]  . Hypertension   . Sleep apnea     cpap  . Asthma     recently started symbicort -  rubio  . Pneumonia     hx of  . Hypothyroidism   . Anxiety   . GERD (gastroesophageal reflux disease)     meds control  . Headache   . Arthritis   . History of blood transfusion     related to menstrual cycle  . Primary localized osteoarthritis of left knee 06/21/2014  . Severe obesity (BMI >= 40) 06/21/2014    Surgeries: Procedure(s): LEFT TOTAL KNEE ARTHROPLASTY on 06/21/2014   Consultants (if any):    Discharged Condition: Improved  Hospital Course: Tamara Daugherty is an 63 y.o. female who was admitted 06/21/2014 with a diagnosis of Primary localized osteoarthritis of left knee and went to the operating room on 06/21/2014 and underwent the above named procedures.    She was given perioperative antibiotics:  Anti-infectives    Start     Dose/Rate Route Frequency Ordered Stop   06/21/14 1530  ceFAZolin (ANCEF) IVPB 2 g/50 mL premix     2 g100 mL/hr over 30 Minutes Intravenous Every 6 hours 06/21/14 1401 06/21/14 2145   06/21/14 0600  ceFAZolin (ANCEF) 3 g in dextrose 5 % 50 mL IVPB     3 g160 mL/hr over 30 Minutes Intravenous On call to O.R.  06/20/14 1343 06/21/14 0736    .  She was given sequential compression devices, early ambulation, and xarelto for DVT prophylaxis. Her hyponatremia and renal insufficiency improved with hydration and time.  She benefited maximally from the hospital stay and there were no complications.    Recent vital signs:  Filed Vitals:   06/23/14 0500  BP: 101/51  Pulse: 82  Temp: 99.2 F (37.3 C)  Resp: 16    Recent laboratory studies:  Lab Results  Component Value Date   HGB 9.1* 06/23/2014   HGB 9.4* 06/22/2014   HGB 11.5* 06/09/2014   Lab Results  Component Value Date   WBC 9.1 06/23/2014   PLT 256 06/23/2014   Lab Results  Component Value Date   INR 1.02 06/09/2014   Lab Results  Component Value Date   NA 129* 06/23/2014   K 3.8 06/23/2014   CL 94* 06/23/2014   CO2 28 06/23/2014   BUN 11 06/23/2014   CREATININE 1.28* 06/23/2014   GLUCOSE 113* 06/23/2014    Discharge Medications:     Medication List    STOP taking these medications        acetaminophen 500 MG tablet  Commonly known as:  TYLENOL     clopidogrel 75 MG tablet  Commonly known as:  PLAVIX  TAKE these medications        albuterol 108 (90 BASE) MCG/ACT inhaler  Commonly known as:  PROVENTIL HFA;VENTOLIN HFA  Inhale 2 puffs into the lungs every 6 (six) hours as needed for wheezing or shortness of breath.     baclofen 10 MG tablet  Commonly known as:  LIORESAL  Take 1 tablet (10 mg total) by mouth 3 (three) times daily. As needed for muscle spasm     budesonide-formoterol 80-4.5 MCG/ACT inhaler  Commonly known as:  SYMBICORT  Inhale 2 puffs into the lungs 2 (two) times daily.     CALCIUM-VITAMIN D PO  Take 1 tablet by mouth daily.     diclofenac sodium 1 % Gel  Commonly known as:  VOLTAREN  Apply 2 g topically 2 (two) times daily. Both knees and lower back     EPINEPHrine 0.3 mg/0.3 mL Soaj injection  Commonly known as:  EPI-PEN  Inject 0.3 mg into the muscle once.     FISH OIL PO   Take 1 capsule by mouth 2 (two) times daily.     fluticasone 50 MCG/ACT nasal spray  Commonly known as:  FLONASE  Place 1 spray into both nostrils 2 (two) times daily as needed for allergies or rhinitis.     guaiFENesin 600 MG 12 hr tablet  Commonly known as:  MUCINEX  Take 600 mg by mouth 2 (two) times daily as needed for to loosen phlegm.     hydrocortisone 2.5 % cream  Apply 1 application topically 2 (two) times daily as needed (rash).     hydrOXYzine 25 MG tablet  Commonly known as:  ATARAX/VISTARIL  Take 25 mg by mouth every 8 (eight) hours as needed for itching.     levothyroxine 50 MCG tablet  Commonly known as:  SYNTHROID, LEVOTHROID  Take 50 mcg by mouth daily before breakfast.     loratadine 10 MG tablet  Commonly known as:  CLARITIN  Take 10 mg by mouth daily as needed for allergies.     MATZIM LA 240 MG 24 hr tablet  Generic drug:  diltiazem  Take 240 mg by mouth daily.     meclizine 25 MG tablet  Commonly known as:  ANTIVERT  Take 25 mg by mouth 4 (four) times daily as needed for dizziness.     multivitamin with minerals tablet  Take 1 tablet by mouth daily.     nystatin cream  Commonly known as:  MYCOSTATIN  Apply 1 application topically 2 (two) times daily as needed (ffected area).     olmesartan-hydrochlorothiazide 40-25 MG per tablet  Commonly known as:  BENICAR HCT  Take 1 tablet by mouth daily.     ondansetron 4 MG tablet  Commonly known as:  ZOFRAN  Take 1 tablet (4 mg total) by mouth every 8 (eight) hours as needed for nausea or vomiting.     oxyCODONE-acetaminophen 10-325 MG per tablet  Commonly known as:  PERCOCET  Take 1-2 tablets by mouth every 6 (six) hours as needed for pain. MAXIMUM TOTAL ACETAMINOPHEN DOSE IS 4000 MG PER DAY     pantoprazole 40 MG tablet  Commonly known as:  PROTONIX  Take 40 mg by mouth daily.     potassium chloride 10 MEQ CR capsule  Commonly known as:  MICRO-K  Take 30 mEq by mouth 2 (two) times daily.      pyridOXINE 50 MG tablet  Commonly known as:  B-6  Take 50 mg by mouth daily.  rivaroxaban 10 MG Tabs tablet  Commonly known as:  XARELTO  Take 1 tablet (10 mg total) by mouth daily.     sennosides-docusate sodium 8.6-50 MG tablet  Commonly known as:  SENOKOT-S  Take 2 tablets by mouth daily.     simvastatin 20 MG tablet  Commonly known as:  ZOCOR  Take 20 mg by mouth daily.     torsemide 20 MG tablet  Commonly known as:  DEMADEX  Take 20 mg by mouth daily.        Diagnostic Studies: Dg Knee Left Port  06/21/2014   CLINICAL DATA:  total knee replacement using cement, left.  EXAM: PORTABLE LEFT KNEE - 1-2 VIEW  COMPARISON:  None available  FINDINGS: Three cemented components of knee arthroplasty project in expected location. Negative for fracture or dislocation. Normal alignment.  IMPRESSION: 1. Left knee arthroplasty without apparent complication.   Electronically Signed   By: Arne Cleveland M.D.   On: 06/21/2014 12:14    Disposition: Final discharge disposition not confirmed        Follow-up Information    Follow up with Johnny Bridge, MD. Schedule an appointment as soon as possible for a visit in 2 weeks.   Specialty:  Orthopedic Surgery   Contact information:   [place] [address] [phone]       Follow up with [facility].   Why:  They will contact you to schedule home physical therapy.   Contact information:   Telephone # [phone]       Signed: Johnny Bridge 06/23/2014, 8:41 AM

## 2014-06-23 NOTE — Progress Notes (Signed)
Patient provided with discharge instructions and follow up information. She is going home with HHPT set up through [facility] in [place]. She is going home at this time with her husband.

## 2014-06-23 NOTE — Progress Notes (Signed)
Occupational Therapy Treatment Patient Details Name: Tamara Daugherty MRN: [id number] DOB: Jun 28, 1950 Today's Date: 06/23/2014    History of present illness 63 y.o. female admitted to [facility] on 06/21/14 for elective L TKA.  Pt with significant PMHx of TIA (affected memory for a short time), HTN, asthma, anxiety, back surgery, breast surgery, carpal tunnel surgery, and trigger thumb surgery.    OT comments  Educated on shower transfer and pt practiced with AE, donning underwear.  Pt had hard time describing altered feeling:  Lightheaded, seeing black spots.  Felt better once she was seated  Follow Up Recommendations  No OT follow up;Supervision/Assistance - 24 hour    Equipment Recommendations  3 in 1 bedside comode (delivered)    Recommendations for Other Services      Precautions / Restrictions Precautions Precautions: Knee Required Braces or Orthoses: Knee Immobilizer - Left Knee Immobilizer - Left: On when out of bed or walking Restrictions LLE Weight Bearing: Weight bearing as tolerated       Mobility Bed Mobility                  Transfers   Equipment used: Rolling walker (2 wheeled) Transfers: Sit to/from Stand Sit to Stand: Min guard         General transfer comment: close guard for safety:  pt's transitions are not smooth    Balance                                   ADL                       Lower Body Dressing: Minimal assistance;Sit to/from stand;With adaptive equipment   Toilet Transfer: Min guard;Ambulation (back to recliner)       Tub/ Shower Transfer: Min guard;Walk-in shower;Ambulation (simulated ledge)     General ADL Comments: pt practiced with reacher and sock aide:  able to lift LLE for reacher, but not ready for sock aide on this foot yet.  Simulated ledge for shower transfer.  Pt did not need to use commode.  Her transition from sit to stand is not smooth, and min guard needed for safety.  Initially when sitting pt  c/o lightheadedness BP 92/74.  When standing, she complained of black spots and sort of felt out of her head--did not take second BP in standing:  assisted her back to chair for safety      Vision                     Perception     Praxis      Cognition   Behavior During Therapy: [facility] for tasks assessed/performed Overall Cognitive Status: Within Functional Limits for tasks assessed                       Extremity/Trunk Assessment               Exercises     Shoulder Instructions       General Comments      Pertinent Vitals/ Pain       Pain Score: 0-No pain  Home Living                                          Prior Functioning/Environment  Frequency       Progress Toward Goals  OT Goals(current goals can now be found in the care plan section)  Progress towards OT goals: Progressing toward goals  Acute Rehab OT Goals Patient Stated Goal: walk on my own again  Plan      Co-evaluation                 End of Session     Activity Tolerance  (c/o lightheadedness, black spots: resolved when sitting)   Patient Left in chair;with call bell/phone within reach   Nurse Communication          Time: 5537-4827 OT Time Calculation (min): 40 min  Charges: OT General Charges $OT Visit: 1 Procedure OT Treatments $Self Care/Home Management : 38-52 mins  Kwanza Cancelliere 06/23/2014, 11:20 AM  Lesle Chris, OTR/L [phone] 06/23/2014

## 2015-01-12 ENCOUNTER — Other Ambulatory Visit: Payer: Self-pay | Admitting: Orthopedic Surgery

## 2015-03-08 ENCOUNTER — Encounter (HOSPITAL_COMMUNITY)
Admission: RE | Admit: 2015-03-08 | Discharge: 2015-03-08 | Disposition: A | Discharge status: Home / Self Care | Payer: BLUE CROSS/BLUE SHIELD | Source: Ambulatory Visit | Attending: Orthopedic Surgery | Admitting: Orthopedic Surgery

## 2015-03-08 ENCOUNTER — Encounter (HOSPITAL_COMMUNITY): Payer: Self-pay

## 2015-03-08 DIAGNOSIS — M179 Osteoarthritis of knee, unspecified: Secondary | ICD-10-CM | POA: Diagnosis not present

## 2015-03-08 DIAGNOSIS — Z01812 Encounter for preprocedural laboratory examination: Secondary | ICD-10-CM | POA: Diagnosis present

## 2015-03-08 HISTORY — DX: Dizziness and giddiness: R42

## 2015-03-08 HISTORY — DX: Personal history of colon polyps, unspecified: Z86.0100

## 2015-03-08 HISTORY — DX: Other skin changes: R23.8

## 2015-03-08 HISTORY — DX: Anemia, unspecified: D64.9

## 2015-03-08 HISTORY — DX: Spontaneous ecchymoses: R23.3

## 2015-03-08 HISTORY — DX: Personal history of colonic polyps: Z86.010

## 2015-03-08 HISTORY — DX: Edema, unspecified: R60.9

## 2015-03-08 HISTORY — DX: Pain in unspecified joint: M25.50

## 2015-03-08 HISTORY — DX: Allergy status to unspecified drugs, medicaments and biological substances: Z88.9

## 2015-03-08 HISTORY — DX: Other muscle spasm: M62.838

## 2015-03-08 HISTORY — DX: Pruritus, unspecified: L29.9

## 2015-03-08 HISTORY — DX: Localized edema: R60.0

## 2015-03-08 HISTORY — DX: Hyperlipidemia, unspecified: E78.5

## 2015-03-08 HISTORY — DX: Effusion, unspecified joint: M25.40

## 2015-03-08 HISTORY — DX: Personal history of other diseases of the nervous system and sense organs: Z86.69

## 2015-03-08 LAB — CBC
HCT: 35.9 % — ABNORMAL LOW (ref 36.0–46.0)
Hemoglobin: 12 g/dL (ref 12.0–15.0)
MCH: 30.1 pg (ref 26.0–34.0)
MCHC: 33.4 g/dL (ref 30.0–36.0)
MCV: 90 fL (ref 78.0–100.0)
Platelets: 303 10*3/uL (ref 150–400)
RBC: 3.99 MIL/uL (ref 3.87–5.11)
RDW: 15.4 % (ref 11.5–15.5)
WBC: 7.6 10*3/uL (ref 4.0–10.5)

## 2015-03-08 LAB — BASIC METABOLIC PANEL
Anion gap: 7 (ref 5–15)
BUN: 18 mg/dL (ref 6–20)
CO2: 30 mmol/L (ref 22–32)
Calcium: 9.5 mg/dL (ref 8.9–10.3)
Chloride: 101 mmol/L (ref 101–111)
Creatinine, Ser: 1.29 mg/dL — ABNORMAL HIGH (ref 0.44–1.00)
GFR calc Af Amer: 50 mL/min — ABNORMAL LOW (ref >60)
GFR calc non Af Amer: 43 mL/min — ABNORMAL LOW (ref >60)
Glucose, Bld: 102 mg/dL — ABNORMAL HIGH (ref 65–99)
Potassium: 3.8 mmol/L (ref 3.5–5.1)
Sodium: 138 mmol/L (ref 135–145)

## 2015-03-08 LAB — SURGICAL PCR SCREEN
MRSA, PCR: NEGATIVE
Staphylococcus aureus: NEGATIVE

## 2015-03-08 NOTE — Progress Notes (Addendum)
Cardiologist with [facility] in [place].[facility]  Medical Md is Dr.Paul Brynda Greathouse  Echo report under media tab from 10-07-13  Stress test under media tab 12-15-13  EKG in epic from 05-24-14  CXR denies having one in the past yr  Heart cath 2000

## 2015-03-08 NOTE — Pre-Procedure Instructions (Signed)
HANNAN TETZLAFF  03/08/2015      [id number] - [place], [place] [name] [place] [address] Phone: [phone] Fax: [phone]    Your procedure is scheduled on Tues, Sept 27 @ 7:30 AM  Report to [facility] Admitting at 5:30 AM  Call this number if you have problems the morning of surgery:  [phone]   Remember:  Do not eat food or drink liquids after midnight.  Take these medicines the morning of surgery with A SIP OF WATER Albuterol<Bring Your Inhaler With You>,Baclofen(Lioresal),Symbicort(Budesonide),Diltiazem(Matzim),Flonase(Fluticasone),Synthroid(Levothyroxine),Meclizine(Antivert-if needed),Ondansetron(Zofran-if needed),Pain Pill(if needed),and Pantoprazole(Protonix)               Stop taking your Xarelto,Fish Oil,Vitamins,Herbal Medications,and Diclofenac gel. No Goody's,BC's,Aleve,or Ibuprofen,    Do not wear jewelry, make-up or nail polish.  Do not wear lotions, powders, or perfumes.  You may wear deodorant.  Do not shave 48 hours prior to surgery.    Do not bring valuables to the hospital.  [facility] is not responsible for any belongings or valuables.  Contacts, dentures or bridgework may not be worn into surgery.  Leave your suitcase in the car.  After surgery it may be brought to your room.  For patients admitted to the hospital, discharge time will be determined by your treatment team.  Patients discharged the day of surgery will not be allowed to drive home.    Special instructions:  [facility] - Preparing for Surgery  Before surgery, you can play an important role.  Because skin is not sterile, your skin needs to be as free of germs as possible.  You can reduce the number of germs on you skin by washing with CHG (chlorahexidine gluconate) soap before surgery.  CHG is an antiseptic cleaner which kills germs and bonds with the skin to continue killing germs even after  washing.  Please DO NOT use if you have an allergy to CHG or antibacterial soaps.  If your skin becomes reddened/irritated stop using the CHG and inform your nurse when you arrive at Short Stay.  Do not shave (including legs and underarms) for at least 48 hours prior to the first CHG shower.  You may shave your face.  Please follow these instructions carefully:   1.  Shower with CHG Soap the night before surgery and the                                morning of Surgery.  2.  If you choose to wash your hair, wash your hair first as usual with your       normal shampoo.  3.  After you shampoo, rinse your hair and body thoroughly to remove the                      Shampoo.  4.  Use CHG as you would any other liquid soap.  You can apply chg directly       to the skin and wash gently with scrungie or a clean washcloth.  5.  Apply the CHG Soap to your body ONLY FROM THE NECK DOWN.        Do not use on open wounds or open sores.  Avoid contact with your eyes,       ears, mouth and genitals (private parts).  Wash genitals (private parts)  with your normal soap.  6.  Wash thoroughly, paying special attention to the area where your surgery        will be performed.  7.  Thoroughly rinse your body with warm water from the neck down.  8.  DO NOT shower/wash with your normal soap after using and rinsing off       the CHG Soap.  9.  Pat yourself dry with a clean towel.            10.  Wear clean pajamas.            11.  Place clean sheets on your bed the night of your first shower and do not        sleep with pets.  Day of Surgery  Do not apply any lotions/deoderants the morning of surgery.  Please wear clean clothes to the hospital/surgery center.    Please read over the following fact sheets that you were given. Pain Booklet, Coughing and Deep Breathing, MRSA Information and Surgical Site Infection Prevention

## 2015-03-20 MED ORDER — CEFAZOLIN SODIUM 10 G IJ SOLR
3.0000 g | INTRAMUSCULAR | Status: DC
  Filled 2015-03-20: qty 3000

## 2015-03-20 MED ORDER — DEXTROSE 5 % IV SOLN
3.0000 g | INTRAVENOUS | Status: AC
  Administered 2015-03-21: 3 g via INTRAVENOUS
  Filled 2015-03-20 (×2): qty 3000

## 2015-03-21 ENCOUNTER — Inpatient Hospital Stay (HOSPITAL_COMMUNITY): Payer: BLUE CROSS/BLUE SHIELD

## 2015-03-21 ENCOUNTER — Inpatient Hospital Stay (HOSPITAL_COMMUNITY)
Admission: RE | Admit: 2015-03-21 | Discharge: 2015-03-23 | DRG: 470 | Disposition: A | Discharge status: Home Health | Payer: BLUE CROSS/BLUE SHIELD | Source: Ambulatory Visit | Attending: Orthopedic Surgery | Admitting: Orthopedic Surgery

## 2015-03-21 ENCOUNTER — Inpatient Hospital Stay (HOSPITAL_COMMUNITY): Payer: BLUE CROSS/BLUE SHIELD | Admitting: Anesthesiology

## 2015-03-21 ENCOUNTER — Encounter (HOSPITAL_COMMUNITY)
Admission: RE | Disposition: A | Discharge status: Home Health | Payer: Self-pay | Source: Ambulatory Visit | Attending: Orthopedic Surgery

## 2015-03-21 ENCOUNTER — Encounter (HOSPITAL_COMMUNITY): Payer: Self-pay | Admitting: Certified Registered Nurse Anesthetist

## 2015-03-21 DIAGNOSIS — Z6841 Body Mass Index (BMI) 40.0 and over, adult: Secondary | ICD-10-CM

## 2015-03-21 DIAGNOSIS — Z7902 Long term (current) use of antithrombotics/antiplatelets: Secondary | ICD-10-CM | POA: Diagnosis not present

## 2015-03-21 DIAGNOSIS — E039 Hypothyroidism, unspecified: Secondary | ICD-10-CM | POA: Diagnosis present

## 2015-03-21 DIAGNOSIS — K219 Gastro-esophageal reflux disease without esophagitis: Secondary | ICD-10-CM | POA: Diagnosis present

## 2015-03-21 DIAGNOSIS — R42 Dizziness and giddiness: Secondary | ICD-10-CM | POA: Diagnosis present

## 2015-03-21 DIAGNOSIS — Z8673 Personal history of transient ischemic attack (TIA), and cerebral infarction without residual deficits: Secondary | ICD-10-CM

## 2015-03-21 DIAGNOSIS — Z91030 Bee allergy status: Secondary | ICD-10-CM | POA: Diagnosis not present

## 2015-03-21 DIAGNOSIS — Z882 Allergy status to sulfonamides status: Secondary | ICD-10-CM | POA: Diagnosis not present

## 2015-03-21 DIAGNOSIS — G4730 Sleep apnea, unspecified: Secondary | ICD-10-CM | POA: Diagnosis present

## 2015-03-21 DIAGNOSIS — L910 Hypertrophic scar: Secondary | ICD-10-CM | POA: Diagnosis present

## 2015-03-21 DIAGNOSIS — Z96659 Presence of unspecified artificial knee joint: Secondary | ICD-10-CM

## 2015-03-21 DIAGNOSIS — D649 Anemia, unspecified: Secondary | ICD-10-CM | POA: Diagnosis present

## 2015-03-21 DIAGNOSIS — M1711 Unilateral primary osteoarthritis, right knee: Secondary | ICD-10-CM | POA: Diagnosis present

## 2015-03-21 DIAGNOSIS — Z96652 Presence of left artificial knee joint: Secondary | ICD-10-CM | POA: Diagnosis present

## 2015-03-21 DIAGNOSIS — E785 Hyperlipidemia, unspecified: Secondary | ICD-10-CM | POA: Diagnosis present

## 2015-03-21 DIAGNOSIS — I10 Essential (primary) hypertension: Secondary | ICD-10-CM | POA: Diagnosis present

## 2015-03-21 DIAGNOSIS — Z888 Allergy status to other drugs, medicaments and biological substances status: Secondary | ICD-10-CM | POA: Diagnosis not present

## 2015-03-21 DIAGNOSIS — Z880 Allergy status to penicillin: Secondary | ICD-10-CM

## 2015-03-21 DIAGNOSIS — M179 Osteoarthritis of knee, unspecified: Secondary | ICD-10-CM | POA: Diagnosis present

## 2015-03-21 DIAGNOSIS — J45909 Unspecified asthma, uncomplicated: Secondary | ICD-10-CM | POA: Diagnosis present

## 2015-03-21 DIAGNOSIS — M1710 Unilateral primary osteoarthritis, unspecified knee: Secondary | ICD-10-CM | POA: Diagnosis present

## 2015-03-21 HISTORY — PX: TOTAL KNEE ARTHROPLASTY: SHX125

## 2015-03-21 HISTORY — DX: Unilateral primary osteoarthritis, right knee: M17.11

## 2015-03-21 SURGERY — ARTHROPLASTY, KNEE, TOTAL
Anesthesia: General | Laterality: Right

## 2015-03-21 MED ORDER — PHENOL 1.4 % MT LIQD: 1.0000 | OROMUCOSAL | Status: DC | PRN

## 2015-03-21 MED ORDER — TORSEMIDE 20 MG PO TABS
20.0000 mg | ORAL_TABLET | Freq: Every day | ORAL | Status: DC
  Administered 2015-03-22 – 2015-03-23 (×2): 20 mg via ORAL
  Filled 2015-03-21 (×3): qty 1

## 2015-03-21 MED ORDER — HYDROCODONE-ACETAMINOPHEN 10-325 MG PO TABS
1.0000 | ORAL_TABLET | Freq: Four times a day (QID) | ORAL | Status: DC | PRN
  Administered 2015-03-21 – 2015-03-23 (×7): 2 via ORAL
  Filled 2015-03-21 (×7): qty 2

## 2015-03-21 MED ORDER — POTASSIUM CHLORIDE CRYS ER 20 MEQ PO TBCR
20.0000 meq | EXTENDED_RELEASE_TABLET | Freq: Two times a day (BID) | ORAL | Status: DC
  Administered 2015-03-21 – 2015-03-23 (×4): 20 meq via ORAL
  Filled 2015-03-21 (×5): qty 1

## 2015-03-21 MED ORDER — GUAIFENESIN-CODEINE 100-10 MG/5ML PO SYRP
10.0000 mL | ORAL_SOLUTION | Freq: Four times a day (QID) | ORAL | Status: DC | PRN
  Filled 2015-03-21 (×2): qty 10

## 2015-03-21 MED ORDER — VITAMIN C 250 MG PO TABS
125.0000 mg | ORAL_TABLET | Freq: Every day | ORAL | Status: DC
  Administered 2015-03-21 – 2015-03-23 (×3): 125 mg via ORAL
  Filled 2015-03-21 (×5): qty 1

## 2015-03-21 MED ORDER — GLYCOPYRROLATE 0.2 MG/ML IJ SOLN
INTRAMUSCULAR | Status: DC | PRN
Start: 2015-03-21 — End: 2015-03-21
  Administered 2015-03-21: 0.6 mg via INTRAVENOUS

## 2015-03-21 MED ORDER — METOCLOPRAMIDE HCL 5 MG/ML IJ SOLN
INTRAMUSCULAR | Status: AC
  Filled 2015-03-21: qty 2

## 2015-03-21 MED ORDER — BUPIVACAINE HCL (PF) 0.25 % IJ SOLN
INTRAMUSCULAR | Status: AC
  Filled 2015-03-21: qty 30

## 2015-03-21 MED ORDER — BISACODYL 10 MG RE SUPP: 10.0000 mg | Freq: Every day | RECTAL | Status: DC | PRN

## 2015-03-21 MED ORDER — IRBESARTAN 300 MG PO TABS
300.0000 mg | ORAL_TABLET | Freq: Every day | ORAL | Status: DC
  Administered 2015-03-21 – 2015-03-23 (×3): 300 mg via ORAL
  Filled 2015-03-21 (×4): qty 1

## 2015-03-21 MED ORDER — FENTANYL CITRATE (PF) 100 MCG/2ML IJ SOLN
INTRAMUSCULAR | Status: DC | PRN
  Administered 2015-03-21 (×6): 50 ug via INTRAVENOUS
  Administered 2015-03-21: 100 ug via INTRAVENOUS

## 2015-03-21 MED ORDER — ACETAMINOPHEN 325 MG PO TABS: 650.0000 mg | ORAL_TABLET | Freq: Four times a day (QID) | ORAL | Status: DC | PRN

## 2015-03-21 MED ORDER — METOCLOPRAMIDE HCL 5 MG PO TABS
5.0000 mg | ORAL_TABLET | Freq: Three times a day (TID) | ORAL | Status: DC | PRN
Start: 2015-03-21 — End: 2015-03-23

## 2015-03-21 MED ORDER — POLYETHYLENE GLYCOL 3350 17 G PO PACK
17.0000 g | PACK | Freq: Every day | ORAL | Status: DC | PRN
  Administered 2015-03-22: 17 g via ORAL
  Filled 2015-03-21: qty 1

## 2015-03-21 MED ORDER — FENTANYL CITRATE (PF) 250 MCG/5ML IJ SOLN
INTRAMUSCULAR | Status: AC
  Filled 2015-03-21: qty 5

## 2015-03-21 MED ORDER — METHOCARBAMOL 1000 MG/10ML IJ SOLN
500.0000 mg | Freq: Four times a day (QID) | INTRAMUSCULAR | Status: DC | PRN
  Filled 2015-03-21: qty 5

## 2015-03-21 MED ORDER — HYDROMORPHONE HCL 1 MG/ML IJ SOLN
0.2500 mg | INTRAMUSCULAR | Status: DC | PRN
  Administered 2015-03-21 (×2): 0.5 mg via INTRAVENOUS

## 2015-03-21 MED ORDER — ONDANSETRON HCL 4 MG/2ML IJ SOLN
4.0000 mg | Freq: Four times a day (QID) | INTRAMUSCULAR | Status: DC | PRN
  Administered 2015-03-21: 4 mg via INTRAVENOUS

## 2015-03-21 MED ORDER — IRON-VITAMIN C 65-125 MG PO TABS: 1.0000 | ORAL_TABLET | Freq: Two times a day (BID) | ORAL | Status: DC

## 2015-03-21 MED ORDER — ONDANSETRON HCL 4 MG PO TABS: 4.0000 mg | ORAL_TABLET | Freq: Three times a day (TID) | ORAL | Status: DC | PRN

## 2015-03-21 MED ORDER — SENNA 8.6 MG PO TABS
1.0000 | ORAL_TABLET | Freq: Two times a day (BID) | ORAL | Status: DC
  Administered 2015-03-21 – 2015-03-23 (×5): 8.6 mg via ORAL
  Filled 2015-03-21 (×4): qty 1

## 2015-03-21 MED ORDER — HYDROCHLOROTHIAZIDE 25 MG PO TABS
25.0000 mg | ORAL_TABLET | Freq: Every day | ORAL | Status: DC
  Administered 2015-03-22 – 2015-03-23 (×2): 25 mg via ORAL
  Filled 2015-03-21 (×3): qty 1

## 2015-03-21 MED ORDER — ONDANSETRON HCL 4 MG PO TABS: 4.0000 mg | ORAL_TABLET | Freq: Four times a day (QID) | ORAL | Status: DC | PRN

## 2015-03-21 MED ORDER — DOCUSATE SODIUM 100 MG PO CAPS
100.0000 mg | ORAL_CAPSULE | Freq: Two times a day (BID) | ORAL | Status: DC
  Administered 2015-03-21 – 2015-03-23 (×5): 100 mg via ORAL
  Filled 2015-03-21 (×4): qty 1

## 2015-03-21 MED ORDER — PROPOFOL 10 MG/ML IV BOLUS
INTRAVENOUS | Status: DC | PRN
  Administered 2015-03-21: 160 mg via INTRAVENOUS

## 2015-03-21 MED ORDER — VITAMIN B-6 50 MG PO TABS
50.0000 mg | ORAL_TABLET | Freq: Every day | ORAL | Status: DC
  Administered 2015-03-21 – 2015-03-23 (×3): 50 mg via ORAL
  Filled 2015-03-21 (×3): qty 1

## 2015-03-21 MED ORDER — SUCCINYLCHOLINE CHLORIDE 20 MG/ML IJ SOLN
INTRAMUSCULAR | Status: DC | PRN
  Administered 2015-03-21: 100 mg via INTRAVENOUS

## 2015-03-21 MED ORDER — ONDANSETRON HCL 4 MG/2ML IJ SOLN
INTRAMUSCULAR | Status: DC | PRN
  Administered 2015-03-21: 4 mg via INTRAVENOUS

## 2015-03-21 MED ORDER — RIVAROXABAN 10 MG PO TABS
10.0000 mg | ORAL_TABLET | Freq: Every day | ORAL | Status: DC
  Administered 2015-03-22 – 2015-03-23 (×2): 10 mg via ORAL
  Filled 2015-03-21 (×2): qty 1

## 2015-03-21 MED ORDER — ACETAMINOPHEN 650 MG RE SUPP: 650.0000 mg | Freq: Four times a day (QID) | RECTAL | Status: DC | PRN

## 2015-03-21 MED ORDER — LORATADINE 10 MG PO TABS: 10.0000 mg | ORAL_TABLET | Freq: Every day | ORAL | Status: DC | PRN

## 2015-03-21 MED ORDER — HYDROMORPHONE HCL 1 MG/ML IJ SOLN
1.0000 mg | INTRAMUSCULAR | Status: DC | PRN
  Administered 2015-03-21 – 2015-03-22 (×2): 1 mg via INTRAVENOUS
  Filled 2015-03-21 (×2): qty 1

## 2015-03-21 MED ORDER — ADULT MULTIVITAMIN W/MINERALS CH
1.0000 | ORAL_TABLET | Freq: Every day | ORAL | Status: DC
  Administered 2015-03-21 – 2015-03-23 (×3): 1 via ORAL
  Filled 2015-03-21 (×5): qty 1

## 2015-03-21 MED ORDER — ROPIVACAINE HCL 5 MG/ML IJ SOLN
INTRAMUSCULAR | Status: DC | PRN
  Administered 2015-03-21: 20 mL via PERINEURAL

## 2015-03-21 MED ORDER — BACLOFEN 10 MG PO TABS: 10.0000 mg | ORAL_TABLET | Freq: Three times a day (TID) | ORAL | Status: DC

## 2015-03-21 MED ORDER — BUDESONIDE-FORMOTEROL FUMARATE 80-4.5 MCG/ACT IN AERO
2.0000 | INHALATION_SPRAY | Freq: Two times a day (BID) | RESPIRATORY_TRACT | Status: DC
  Administered 2015-03-21 – 2015-03-23 (×4): 2 via RESPIRATORY_TRACT
  Filled 2015-03-21: qty 6.9

## 2015-03-21 MED ORDER — SENNA-DOCUSATE SODIUM 8.6-50 MG PO TABS: 2.0000 | ORAL_TABLET | Freq: Every day | ORAL | Status: DC

## 2015-03-21 MED ORDER — DEXAMETHASONE SODIUM PHOSPHATE 4 MG/ML IJ SOLN
INTRAMUSCULAR | Status: DC | PRN
  Administered 2015-03-21: 8 mg via INTRAVENOUS

## 2015-03-21 MED ORDER — DILTIAZEM HCL ER COATED BEADS 240 MG PO TB24
240.0000 mg | ORAL_TABLET | Freq: Every day | ORAL | Status: DC
  Administered 2015-03-21 – 2015-03-22 (×2): 240 mg via ORAL
  Filled 2015-03-21 (×6): qty 1

## 2015-03-21 MED ORDER — PANTOPRAZOLE SODIUM 40 MG PO TBEC
40.0000 mg | DELAYED_RELEASE_TABLET | Freq: Every day | ORAL | Status: DC
  Administered 2015-03-21 – 2015-03-23 (×3): 40 mg via ORAL
  Filled 2015-03-21 (×3): qty 1

## 2015-03-21 MED ORDER — LACTATED RINGERS IV SOLN
INTRAVENOUS | Status: DC | PRN
  Administered 2015-03-21 (×3): via INTRAVENOUS

## 2015-03-21 MED ORDER — PHENYLEPHRINE HCL 10 MG/ML IJ SOLN
10.0000 mg | INTRAVENOUS | Status: DC | PRN
  Administered 2015-03-21: 10 ug/min via INTRAVENOUS

## 2015-03-21 MED ORDER — SODIUM CHLORIDE 0.9 % IR SOLN
Status: DC | PRN
  Administered 2015-03-21: 1000 mL

## 2015-03-21 MED ORDER — MENTHOL 3 MG MT LOZG
1.0000 | LOZENGE | OROMUCOSAL | Status: DC | PRN
  Administered 2015-03-21 – 2015-03-23 (×2): 3 mg via ORAL
  Filled 2015-03-21 (×3): qty 9

## 2015-03-21 MED ORDER — FERROUS SULFATE 325 (65 FE) MG PO TABS
325.0000 mg | ORAL_TABLET | Freq: Every day | ORAL | Status: DC
  Administered 2015-03-21 – 2015-03-23 (×2): 325 mg via ORAL
  Filled 2015-03-21 (×3): qty 1

## 2015-03-21 MED ORDER — SODIUM CHLORIDE 0.9 % IJ SOLN
INTRAMUSCULAR | Status: DC | PRN
  Administered 2015-03-21: 40 mL via INTRAVENOUS

## 2015-03-21 MED ORDER — OLMESARTAN MEDOXOMIL-HCTZ 40-25 MG PO TABS: 1.0000 | ORAL_TABLET | Freq: Every day | ORAL | Status: DC

## 2015-03-21 MED ORDER — KETOROLAC TROMETHAMINE 15 MG/ML IJ SOLN
7.5000 mg | Freq: Four times a day (QID) | INTRAMUSCULAR | Status: AC
  Administered 2015-03-21: 7.5 mg via INTRAVENOUS
  Filled 2015-03-21: qty 1

## 2015-03-21 MED ORDER — ALUM & MAG HYDROXIDE-SIMETH 200-200-20 MG/5ML PO SUSP: 30.0000 mL | ORAL | Status: DC | PRN

## 2015-03-21 MED ORDER — FLUTICASONE PROPIONATE 50 MCG/ACT NA SUSP
1.0000 | Freq: Two times a day (BID) | NASAL | Status: DC | PRN
  Filled 2015-03-21: qty 16

## 2015-03-21 MED ORDER — HYDROCODONE-ACETAMINOPHEN 10-325 MG PO TABS: 1.0000 | ORAL_TABLET | Freq: Four times a day (QID) | ORAL | Status: DC | PRN

## 2015-03-21 MED ORDER — POTASSIUM CHLORIDE IN NACL 20-0.45 MEQ/L-% IV SOLN
INTRAVENOUS | Status: DC
  Administered 2015-03-21: 15:00:00 via INTRAVENOUS
  Filled 2015-03-21 (×5): qty 1000

## 2015-03-21 MED ORDER — ATORVASTATIN CALCIUM 10 MG PO TABS
10.0000 mg | ORAL_TABLET | Freq: Every day | ORAL | Status: DC
  Administered 2015-03-22: 10 mg via ORAL
  Filled 2015-03-21 (×2): qty 1

## 2015-03-21 MED ORDER — ALBUTEROL SULFATE (2.5 MG/3ML) 0.083% IN NEBU
2.0000 mL | INHALATION_SOLUTION | Freq: Four times a day (QID) | RESPIRATORY_TRACT | Status: DC | PRN
  Administered 2015-03-22: 2.5 mL via RESPIRATORY_TRACT
  Filled 2015-03-21: qty 3

## 2015-03-21 MED ORDER — BUPIVACAINE LIPOSOME 1.3 % IJ SUSP
20.0000 mL | INTRAMUSCULAR | Status: DC
  Filled 2015-03-21: qty 20

## 2015-03-21 MED ORDER — MECLIZINE HCL 25 MG PO TABS
25.0000 mg | ORAL_TABLET | Freq: Four times a day (QID) | ORAL | Status: DC | PRN
  Filled 2015-03-21: qty 1

## 2015-03-21 MED ORDER — PROPOFOL 10 MG/ML IV BOLUS
INTRAVENOUS | Status: AC
  Filled 2015-03-21: qty 20

## 2015-03-21 MED ORDER — CEFAZOLIN SODIUM-DEXTROSE 2-3 GM-% IV SOLR
2.0000 g | Freq: Four times a day (QID) | INTRAVENOUS | Status: AC
  Administered 2015-03-21 (×2): 2 g via INTRAVENOUS
  Filled 2015-03-21 (×2): qty 50

## 2015-03-21 MED ORDER — METOCLOPRAMIDE HCL 5 MG/ML IJ SOLN
5.0000 mg | Freq: Three times a day (TID) | INTRAMUSCULAR | Status: DC | PRN
  Administered 2015-03-21: 10 mg via INTRAVENOUS

## 2015-03-21 MED ORDER — MIDAZOLAM HCL 5 MG/5ML IJ SOLN
INTRAMUSCULAR | Status: DC | PRN
Start: 2015-03-21 — End: 2015-03-21
  Administered 2015-03-21: 2 mg via INTRAVENOUS

## 2015-03-21 MED ORDER — NYSTATIN 100000 UNIT/GM EX CREA
1.0000 "application " | TOPICAL_CREAM | Freq: Two times a day (BID) | CUTANEOUS | Status: DC | PRN
  Filled 2015-03-21: qty 15

## 2015-03-21 MED ORDER — ONDANSETRON HCL 4 MG/2ML IJ SOLN
INTRAMUSCULAR | Status: AC
  Filled 2015-03-21: qty 2

## 2015-03-21 MED ORDER — RIVAROXABAN 10 MG PO TABS: 10.0000 mg | ORAL_TABLET | Freq: Every day | ORAL | Status: DC

## 2015-03-21 MED ORDER — SIMVASTATIN 20 MG PO TABS
20.0000 mg | ORAL_TABLET | Freq: Every day | ORAL | Status: DC
Start: 2015-03-21 — End: 2015-03-21

## 2015-03-21 MED ORDER — HYDROMORPHONE HCL 1 MG/ML IJ SOLN
INTRAMUSCULAR | Status: AC
  Administered 2015-03-21: 0.5 mg via INTRAVENOUS
  Filled 2015-03-21: qty 1

## 2015-03-21 MED ORDER — TRIAMCINOLONE ACETONIDE 40 MG/ML IJ SUSP
INTRAMUSCULAR | Status: AC
  Filled 2015-03-21: qty 10

## 2015-03-21 MED ORDER — HYDROXYZINE HCL 25 MG PO TABS: 25.0000 mg | ORAL_TABLET | Freq: Three times a day (TID) | ORAL | Status: DC | PRN

## 2015-03-21 MED ORDER — SENNA-DOCUSATE SODIUM 8.6-50 MG PO TABS
2.0000 | ORAL_TABLET | Freq: Every day | ORAL | Status: DC
  Administered 2015-03-21 – 2015-03-23 (×2): 2 via ORAL
  Filled 2015-03-21 (×3): qty 2

## 2015-03-21 MED ORDER — MIDAZOLAM HCL 2 MG/2ML IJ SOLN
INTRAMUSCULAR | Status: AC
  Filled 2015-03-21: qty 4

## 2015-03-21 MED ORDER — NEOSTIGMINE METHYLSULFATE 10 MG/10ML IV SOLN
INTRAVENOUS | Status: DC | PRN
  Administered 2015-03-21: 5 mg via INTRAVENOUS

## 2015-03-21 MED ORDER — SCOPOLAMINE 1 MG/3DAYS TD PT72: 1.0000 | MEDICATED_PATCH | Freq: Once | TRANSDERMAL | Status: DC

## 2015-03-21 MED ORDER — GUAIFENESIN ER 600 MG PO TB12: 600.0000 mg | ORAL_TABLET | Freq: Two times a day (BID) | ORAL | Status: DC | PRN

## 2015-03-21 MED ORDER — ROCURONIUM BROMIDE 100 MG/10ML IV SOLN
INTRAVENOUS | Status: DC | PRN
  Administered 2015-03-21: 50 mg via INTRAVENOUS

## 2015-03-21 MED ORDER — LEVOTHYROXINE SODIUM 50 MCG PO TABS
50.0000 ug | ORAL_TABLET | Freq: Every day | ORAL | Status: DC
  Administered 2015-03-22 – 2015-03-23 (×2): 50 ug via ORAL
  Filled 2015-03-21 (×3): qty 1

## 2015-03-21 MED ORDER — METHOCARBAMOL 500 MG PO TABS
500.0000 mg | ORAL_TABLET | Freq: Four times a day (QID) | ORAL | Status: DC | PRN
  Administered 2015-03-23: 500 mg via ORAL
  Filled 2015-03-21 (×2): qty 1

## 2015-03-21 MED ORDER — OXYCODONE-ACETAMINOPHEN 10-325 MG PO TABS: 1.0000 | ORAL_TABLET | Freq: Four times a day (QID) | ORAL | Status: DC | PRN

## 2015-03-21 MED ORDER — PHENYLEPHRINE HCL 10 MG/ML IJ SOLN
INTRAMUSCULAR | Status: DC | PRN
  Administered 2015-03-21 (×3): 40 ug via INTRAVENOUS
  Administered 2015-03-21: 80 ug via INTRAVENOUS
  Administered 2015-03-21: 40 ug via INTRAVENOUS

## 2015-03-21 MED ORDER — MAGNESIUM CITRATE PO SOLN: 1.0000 | Freq: Once | ORAL | Status: DC | PRN

## 2015-03-21 MED ORDER — LIDOCAINE HCL (CARDIAC) 20 MG/ML IV SOLN
INTRAVENOUS | Status: DC | PRN
  Administered 2015-03-21: 50 mg via INTRAVENOUS

## 2015-03-21 SURGICAL SUPPLY — 69 items
BANDAGE ELASTIC 6 VELCRO ST LF (GAUZE/BANDAGES/DRESSINGS) ×3 IMPLANT
BANDAGE ESMARK 6X9 LF (GAUZE/BANDAGES/DRESSINGS) ×1 IMPLANT
BENZOIN TINCTURE PRP APPL 2/3 (GAUZE/BANDAGES/DRESSINGS) ×3 IMPLANT
BLADE SAG 18X100X1.27 (BLADE) ×3 IMPLANT
BLADE SAW RECIP 87.9 MT (BLADE) ×3 IMPLANT
BLADE SAW SGTL 13X75X1.27 (BLADE) ×3 IMPLANT
BNDG ESMARK 6X9 LF (GAUZE/BANDAGES/DRESSINGS) ×3
BOOTCOVER CLEANROOM LRG (PROTECTIVE WEAR) ×6 IMPLANT
BOWL SMART MIX CTS (DISPOSABLE) ×3 IMPLANT
CAP KNEE TOTAL 3 SIGMA ×3 IMPLANT
CEMENT HV SMART SET (Cement) ×6 IMPLANT
CLOSURE STERI-STRIP 1/2X4 (GAUZE/BANDAGES/DRESSINGS) ×1
CLSR STERI-STRIP ANTIMIC 1/2X4 (GAUZE/BANDAGES/DRESSINGS) ×2 IMPLANT
COVER SURGICAL LIGHT HANDLE (MISCELLANEOUS) ×3 IMPLANT
CUFF TOURNIQUET SINGLE 44IN (TOURNIQUET CUFF) ×3 IMPLANT
DRAPE EXTREMITY T 121X128X90 (DRAPE) ×3 IMPLANT
DRAPE U-SHAPE 47X51 STRL (DRAPES) ×3 IMPLANT
DURAPREP 26ML APPLICATOR (WOUND CARE) ×3 IMPLANT
ELECT CAUTERY BLADE 6.4 (BLADE) ×3 IMPLANT
ELECT REM PT RETURN 9FT ADLT (ELECTROSURGICAL) ×3
ELECTRODE REM PT RTRN 9FT ADLT (ELECTROSURGICAL) ×1 IMPLANT
FACESHIELD STD STERILE (MASK) ×3 IMPLANT
GAUZE SPONGE 4X4 12PLY STRL (GAUZE/BANDAGES/DRESSINGS) ×3 IMPLANT
GLOVE BIO SURGEON STRL SZ7 (GLOVE) ×6 IMPLANT
GLOVE BIOGEL M STER SZ 6 (GLOVE) ×3 IMPLANT
GLOVE BIOGEL PI IND STRL 6 (GLOVE) ×1 IMPLANT
GLOVE BIOGEL PI IND STRL 6.5 (GLOVE) ×2 IMPLANT
GLOVE BIOGEL PI IND STRL 8 (GLOVE) ×1 IMPLANT
GLOVE BIOGEL PI INDICATOR 6 (GLOVE) ×2
GLOVE BIOGEL PI INDICATOR 6.5 (GLOVE) ×4
GLOVE BIOGEL PI INDICATOR 8 (GLOVE) ×2
GLOVE BIOGEL PI ORTHO PRO SZ8 (GLOVE) ×2
GLOVE ORTHO TXT STRL SZ7.5 (GLOVE) ×3 IMPLANT
GLOVE PI ORTHO PRO STRL SZ8 (GLOVE) ×1 IMPLANT
GLOVE SURG ORTHO 8.0 STRL STRW (GLOVE) ×3 IMPLANT
GOWN STRL REUS W/ TWL LRG LVL3 (GOWN DISPOSABLE) ×2 IMPLANT
GOWN STRL REUS W/ TWL XL LVL3 (GOWN DISPOSABLE) ×1 IMPLANT
GOWN STRL REUS W/TWL 2XL LVL3 (GOWN DISPOSABLE) ×3 IMPLANT
GOWN STRL REUS W/TWL LRG LVL3 (GOWN DISPOSABLE) ×4
GOWN STRL REUS W/TWL XL LVL3 (GOWN DISPOSABLE) ×2
HANDPIECE INTERPULSE COAX TIP (DISPOSABLE) ×2
HOOD PEEL AWAY FACE SHEILD DIS (HOOD) ×6 IMPLANT
IMMOBILIZER KNEE 22 (SOFTGOODS) ×3 IMPLANT
KIT BASIN OR (CUSTOM PROCEDURE TRAY) ×3 IMPLANT
KIT ROOM TURNOVER OR (KITS) ×3 IMPLANT
MANIFOLD NEPTUNE II (INSTRUMENTS) ×3 IMPLANT
NEEDLE 18GX1X1/2 (RX/OR ONLY) (NEEDLE) ×3 IMPLANT
NS IRRIG 1000ML POUR BTL (IV SOLUTION) ×3 IMPLANT
PACK TOTAL JOINT (CUSTOM PROCEDURE TRAY) ×3 IMPLANT
PACK UNIVERSAL I (CUSTOM PROCEDURE TRAY) ×3 IMPLANT
PAD ABD 8X10 STRL (GAUZE/BANDAGES/DRESSINGS) ×3 IMPLANT
PAD ARMBOARD 7.5X6 YLW CONV (MISCELLANEOUS) ×6 IMPLANT
PAD CAST 4YDX4 CTTN HI CHSV (CAST SUPPLIES) ×1 IMPLANT
PADDING CAST COTTON 4X4 STRL (CAST SUPPLIES) ×2
PADDING CAST COTTON 6X4 STRL (CAST SUPPLIES) ×3 IMPLANT
SET HNDPC FAN SPRY TIP SCT (DISPOSABLE) ×1 IMPLANT
SUCTION FRAZIER TIP 10 FR DISP (SUCTIONS) ×3 IMPLANT
SUT MNCRL AB 4-0 PS2 18 (SUTURE) IMPLANT
SUT VIC AB 0 CT1 27 (SUTURE) ×2
SUT VIC AB 0 CT1 27XBRD ANBCTR (SUTURE) ×1 IMPLANT
SUT VIC AB 2-0 CT1 27 (SUTURE) ×2
SUT VIC AB 2-0 CT1 TAPERPNT 27 (SUTURE) ×1 IMPLANT
SUT VIC AB 3-0 SH 8-18 (SUTURE) ×6 IMPLANT
SYR 30ML LL (SYRINGE) IMPLANT
SYR 50ML LL SCALE MARK (SYRINGE) ×3 IMPLANT
SYR CONTROL 10ML LL (SYRINGE) ×6 IMPLANT
TOWEL OR 17X24 6PK STRL BLUE (TOWEL DISPOSABLE) ×3 IMPLANT
TOWEL OR 17X26 10 PK STRL BLUE (TOWEL DISPOSABLE) ×3 IMPLANT
WATER STERILE IRR 1000ML POUR (IV SOLUTION) ×6 IMPLANT

## 2015-03-21 NOTE — Op Note (Signed)
DATE OF SURGERY:  03/21/2015 TIME: 9:30 AM  PATIENT NAME:  Tamara Daugherty   AGE: 64 y.o.    PRE-OPERATIVE DIAGNOSIS:  Right knee primary localized osteoarthritis, left knee keloid formation s/p TKA  POST-OPERATIVE DIAGNOSIS:  Same  PROCEDURE:  Procedure(s): Right total knee arthroplasty with intradermal injection of left skin keloid over the left total knee replacement   SURGEON:  Johnny Bridge, MD   ASSISTANT:  Joya Gaskins, OPA-C, present and scrubbed throughout the case, critical for assistance with exposure, retraction, instrumentation, and closure.  Anesthesia: Gen. with a femoral nerve block and Exparel  OPERATIVE IMPLANTS: Depuy PFC Sigma, Posterior Stabilized.  Femur size 3, Tibia size 3, Patella size 35 3-peg oval button, with a 10 mm polyethylene insert.   PREOPERATIVE INDICATIONS:  Tamara Daugherty is a 64 y.o. year old female with end stage bone on bone degenerative arthritis of the knee who failed conservative treatment, including injections, antiinflammatories, activity modification, and assistive devices, and had significant impairment of their activities of daily living, and elected for Total Knee Arthroplasty.   The risks, benefits, and alternatives were discussed at length including but not limited to the risks of infection, bleeding, nerve injury, stiffness, blood clots, the need for revision surgery, cardiopulmonary complications, among others, and they were willing to proceed.  OPERATIVE FINDINGS AND UNIQUE ASPECTS OF THE CASE:  During closure of the right total knee I injected the dermis with a mixture of Kenalog and Marcaine in order to minimize the risk for keloid formation, and I also injected the left keloid scar over the previous total knee. This was also with a mixture of Marcaine and Kenalog. I did have to cut the tibia twice, comparable to the last time. The patella was slightly smaller, and best accommodate a 35 mm insert. My medial parapatellar arthrotomy  had a slightly lateral position on the patella, but the patella tracked nicely after the retinaculum was closed. There was substantial osteophyte formation both posteriorly as well as anteriorly in the midline, the majority of the disease was in the medial compartment but also involve the patellar femoral compartment with osteophyte formation along the lateral femur and lateral tibia.  OPERATIVE DESCRIPTION:  The patient was brought to the operative room and placed in a supine position.  General anesthesia was administered.  IV antibiotics were given.  The lower extremity was prepped and draped in the usual sterile fashion.  Time out was performed.  The leg was elevated and exsanguinated and the tourniquet was inflated.  Anterior quadriceps tendon splitting approach was performed.  The patella was everted and osteophytes were removed.  The anterior horn of the medial and lateral meniscus was removed.   The distal femur was opened with the drill and the intramedullary distal femoral cutting jig was utilized, set at 5 degrees resecting 10 mm off the distal femur.  Care was taken to protect the collateral ligaments.  Then the extramedullary tibial cutting jig was utilized making the appropriate cut using the anterior tibial crest as a reference building in appropriate posterior slope.  Care was taken during the cut to protect the medial and collateral ligaments.  The proximal tibia was removed along with the posterior horns of the menisci.  The PCL was sacrificed.    The extensor gap was measured  and was only 8 mm, and I resected an additional 2 mm off of the tibia, and it was then approximately 31mm.    The distal femoral sizing jig was applied, taking  care to avoid notching.  Then the 4-in-1 cutting jig was applied and the anterior and posterior femur was cut, along with the chamfer cuts.  All posterior osteophytes were removed.  The flexion gap was then measured and was symmetric with the extension  gap.  I completed the distal femoral preparation using the appropriate jig to prepare the box.  The patella was then measured, and cut with the saw.  The thickness before the cut was 23 and after the cut was 15.  The proximal tibia sized and prepared accordingly with the reamer and the punch, and then all components were trialed with the 33mm poly insert.  The knee was found to have excellent balance and full motion.    The above named components were then cemented into place and all excess cement was removed.  The real polyethylene implant was placed.  After the cement had cured I released the tourniquet and confirmed excellent hemostasis with no major posterior vessel injury.    The knee was easily taken through a range of motion and the patella tracked well and the knee irrigated copiously and the parapatellar and subcutaneous tissue closed with vicryl, and monocryl with steri strips for the skin.  The wounds were injected with marcaine, and dressed with sterile gauze and the patient was awakened and returned to the PACU in stable and satisfactory condition.  There were no complications.  Total tourniquet time was 78 minutes.

## 2015-03-21 NOTE — Progress Notes (Signed)
Utilization review completed.  

## 2015-03-21 NOTE — Progress Notes (Signed)
Placed patient on CPAP for the night via auto-mode with minimum pressure 5cm and maximum pressure at 20cm. Oxygen set at 2lpm.

## 2015-03-21 NOTE — Anesthesia Procedure Notes (Addendum)
Anesthesia Regional Block:  Adductor canal block  Pre-Anesthetic Checklist: ,, timeout performed, Correct Patient, Correct Site, Correct Laterality, Correct Procedure, Correct Position, site marked, Risks and benefits discussed,  Surgical consent,  Pre-op evaluation,  At surgeon's request and post-op pain management  Laterality: Right  Prep: chloraprep       Needles:  Injection technique: Single-shot  Needle Type: Echogenic Needle     Needle Length: 9cm 9 cm Needle Gauge: 21 and 21 G    Additional Needles:  Procedures: ultrasound guided (picture in chart) Adductor canal block Narrative:  Start time: 03/21/2015 7:15 AM End time: 03/21/2015 7:22 AM Injection made incrementally with aspirations every 5 mL.  Performed by: Personally  Anesthesiologist: Suzette Battiest  Additional Notes: Risks and benefits discussed. Pt tolerated well with no immediate complications.   Procedure Name: Intubation Date/Time: 03/21/2015 7:43 AM Performed by: Vennie Homans Pre-anesthesia Checklist: Patient identified, Timeout performed, Emergency Drugs available, Suction available and Patient being monitored Patient Re-evaluated:Patient Re-evaluated prior to inductionOxygen Delivery Method: Circle system utilized Preoxygenation: Pre-oxygenation with 100% oxygen Intubation Type: IV induction Ventilation: Mask ventilation without difficulty Laryngoscope Size: Mac and 3 Grade View: Grade II Tube type: Oral Tube size: 7.5 mm Number of attempts: 1 Airway Equipment and Method: Patient positioned with wedge pillow and Stylet Placement Confirmation: ETT inserted through vocal cords under direct vision,  positive ETCO2 and breath sounds checked- equal and bilateral Secured at: 22 cm Tube secured with: Tape Dental Injury: Teeth and Oropharynx as per pre-operative assessment

## 2015-03-21 NOTE — Anesthesia Preprocedure Evaluation (Addendum)
Anesthesia Evaluation  Patient identified by MRN, date of birth, ID band Patient awake    Reviewed: Allergy & Precautions, H&P , NPO status , Patient's Chart, lab work & pertinent test results  Airway Mallampati: III  TM Distance: >3 FB Neck ROM: Full    Dental no notable dental hx. (+) Teeth Intact, Dental Advisory Given   Pulmonary asthma , sleep apnea ,    Pulmonary exam normal breath sounds clear to auscultation       Cardiovascular hypertension, Pt. on medications negative cardio ROS   Rhythm:Regular Rate:Normal     Neuro/Psych  Headaches, Anxiety TIAnegative psych ROS   GI/Hepatic Neg liver ROS, GERD  Medicated,  Endo/Other  Hypothyroidism Morbid obesity  Renal/GU negative Renal ROS  negative genitourinary   Musculoskeletal  (+) Arthritis , Osteoarthritis,    Abdominal   Peds  Hematology negative hematology ROS (+)   Anesthesia Other Findings   Reproductive/Obstetrics negative OB ROS                            Anesthesia Physical Anesthesia Plan  ASA: III  Anesthesia Plan: General   Post-op Pain Management:    Induction: Intravenous  Airway Management Planned: LMA and Oral ETT  Additional Equipment:   Intra-op Plan:   Post-operative Plan: Extubation in OR  Informed Consent: I have reviewed the patients History and Physical, chart, labs and discussed the procedure including the risks, benefits and alternatives for the proposed anesthesia with the patient or authorized representative who has indicated his/her understanding and acceptance.   Dental advisory given  Plan Discussed with: CRNA  Anesthesia Plan Comments: (Pt does not want block)       Anesthesia Quick Evaluation

## 2015-03-21 NOTE — Transfer of Care (Signed)
Immediate Anesthesia Transfer of Care Note  Patient: Tamara Daugherty  Procedure(s) Performed: Procedure(s): TOTAL KNEE ARTHROPLASTY STEROID INECTION BOTH KNEES (Right)  Patient Location: PACU  Anesthesia Type:General  Level of Consciousness: awake, alert , oriented, patient cooperative and responds to stimulation  Airway & Oxygen Therapy: Patient Spontanous Breathing and Patient connected to face mask oxygen  Post-op Assessment: Report given to RN, Post -op Vital signs reviewed and stable, Patient moving all extremities X 4 and Patient able to stick tongue midline  Post vital signs: Reviewed and stable  Last Vitals:  Filed Vitals:   03/21/15 1021  BP:   Pulse:   Temp: 88.3 C    Complications: No apparent anesthesia complications

## 2015-03-21 NOTE — Progress Notes (Signed)
RT note: Pt. seen for CPAP assessment/use, has own tubing with nasal pillows in car, friend/family @ bedside, will be able to retreive for p.m. use, RT to monitor.

## 2015-03-21 NOTE — Evaluation (Signed)
Physical Therapy Evaluation Patient Details Name: Tamara Daugherty MRN: [id number] DOB: 09-Mar-1951 Today's Date: 03/21/2015   History of Present Illness  Patient is a 64 y/o female s/p R TKA and steriod injections in Bil knees. PMH includes TIA, HTN, asthma, anxiety, back surgery, breast surgery, L TKA 05/2014, carpal tunnel surgery and thumb surgery.  Clinical Impression  Patient presents with pain and post surgical deficits RLE s/p R TKA. Tolerated SPT to/from [facility] with Min A for balance/safety due to right knee instability. Pt plans to discharge home with support from spouse - he is taking off a week from work. Instructed pt in exercises. Will follow acutely to maximize independence and mobility prior to return home.   Follow Up Recommendations Home health PT;Supervision/Assistance - 24 hour    Equipment Recommendations  None recommended by PT    Recommendations for Other Services OT consult     Precautions / Restrictions Precautions Precautions: Knee Precaution Booklet Issued: No Precaution Comments: Reviewed no pillow under knee and precautions Required Braces or Orthoses: Knee Immobilizer - Right Knee Immobilizer - Right: On when out of bed or walking Restrictions Weight Bearing Restrictions: Yes RLE Weight Bearing: Weight bearing as tolerated      Mobility  Bed Mobility Overal bed mobility: Needs Assistance Bed Mobility: Supine to Sit;Sit to Supine     Supine to sit: HOB elevated;Min guard Sit to supine: Min assist;HOB elevated   General bed mobility comments: Min A to bring RLE into bed. Use of rails for support to get to EOB.  Transfers Overall transfer level: Needs assistance Equipment used: Rolling walker (2 wheeled) Transfers: Sit to/from [facility] Sit to Stand: Min assist Stand pivot transfers: Min guard       General transfer comment: Multiple attempts to stand with cues for hand placement/technique. Min A to boost from EOB. Min guard to  boost from [facility]. SPT bed to/from [facility].  Ambulation/Gait Ambulation/Gait assistance: Min guard Ambulation Distance (Feet): 5 Feet Assistive device: Rolling walker (2 wheeled) Gait Pattern/deviations: Decreased stance time - right;Decreased step length - left;Trunk flexed;Step-to pattern;Decreased stride length   Gait velocity interpretation: <1.8 ft/sec, indicative of risk for recurrent falls General Gait Details: right knee instability noted during gait.   Stairs            Wheelchair Mobility    Modified Rankin (Stroke Patients Only)       Balance Overall balance assessment: Needs assistance Sitting-balance support: Feet supported;No upper extremity supported Sitting balance-Leahy Scale: Fair     Standing balance support: During functional activity Standing balance-Leahy Scale: Poor Standing balance comment: Relient on RW for support.                              Pertinent Vitals/Pain Pain Assessment: Faces Faces Pain Scale: Hurts little more Pain Location: right knee Pain Descriptors / Indicators: Sore Pain Intervention(s): Monitored during session;Repositioned;Ice applied;Limited activity within patient's tolerance    Home Living Family/patient expects to be discharged to:: Private residence Living Arrangements: Spouse/significant other Available Help at Discharge: Family;Available 24 hours/day (Spouse taking off for the week.) Type of Home: House Home Access: Stairs to enter Entrance Stairs-Rails: None Entrance Stairs-Number of Steps: 1 Home Layout: One level Home Equipment: Cane - single point;Shower seat;Walker - 2 wheels;Bedside commode      Prior Function Level of Independence: Independent with assistive device(s)         Comments: Pt using SPC for ambulation  PTA, drives.      Hand Dominance   Dominant Hand: Right    Extremity/Trunk Assessment   Upper Extremity Assessment: Defer to OT evaluation           Lower Extremity  Assessment: RLE deficits/detail RLE Deficits / Details: Limited AROM/strength secondary to pain and surgery.       Communication   Communication: No difficulties  Cognition Arousal/Alertness: Lethargic;Suspect due to medications Behavior During Therapy: [facility] for tasks assessed/performed Overall Cognitive Status: Within Functional Limits for tasks assessed                      General Comments General comments (skin integrity, edema, etc.): Family present in room during session.    Exercises Total Joint Exercises Ankle Circles/Pumps: Both;10 reps;Supine Quad Sets: Both;10 reps;Supine Gluteal Sets: Both;10 reps;Supine      Assessment/Plan    PT Assessment Patient needs continued PT services  PT Diagnosis Difficulty walking;Acute pain;Generalized weakness   PT Problem List Pain;Decreased strength;Decreased range of motion;Impaired sensation;Decreased activity tolerance;Decreased balance;Decreased mobility  PT Treatment Interventions Balance training;Gait training;Stair training;Functional mobility training;Therapeutic activities;Therapeutic exercise;Patient/family education   PT Goals (Current goals can be found in the Care Plan section) Acute Rehab PT Goals Patient Stated Goal: to return to PLOF PT Goal Formulation: With patient Time For Goal Achievement: 04/04/15 Potential to Achieve Goals: Good    Frequency 7X/week   Barriers to discharge        Co-evaluation               End of Session Equipment Utilized During Treatment: Gait belt;Right knee immobilizer Activity Tolerance: Patient tolerated treatment well Patient left: in bed;with call bell/phone within reach;with family/visitor present;with SCD's reapplied Nurse Communication: Mobility status         Time: 1332-1406 PT Time Calculation (min) (ACUTE ONLY): 34 min   Charges:   PT Evaluation $Initial PT Evaluation Tier I: 1 Procedure PT Treatments $Therapeutic Activity: 8-22 mins   PT G  Codes:        Kassadie Pancake A Terriana Barreras 03/21/2015, 2:42 PM Wray Kearns, [place], DPT [phone]

## 2015-03-21 NOTE — H&P (Signed)
PREOPERATIVE H&P  Chief Complaint: DJD RIGHT KNEE  HPI: Tamara Daugherty is a 64 y.o. female who presents for preoperative history and physical with a diagnosis of DJD RIGHT KNEE. Symptoms are rated as moderate to severe, and have been worsening.  This is significantly impairing activities of daily living.  She has elected for surgical management.   She has failed injections, activity modification, anti-inflammatories, and assistive devices.  Preoperative X-rays demonstrate end stage degenerative changes with osteophyte formation, loss of joint space, subchondral sclerosis.  Past Medical History  Diagnosis Date  . Complication of anesthesia     long time to wake up - referred to pulmonary after carpel tunnel procedure - for asthma and undiagnosed sleep apnea  . TIA (transient ischemic attack)     takes Plavix daily;notices right side is slightly weaker than left  . Heart murmur     [facility] [place]  . Sleep apnea     cpap  . Pneumonia     hx of  . Anxiety   . Headache   . Arthritis   . History of blood transfusion     no abnormal reaction noted  . Primary localized osteoarthritis of left knee 06/21/2014  . Peripheral edema     takes Torsemide daily  . Hyperlipidemia     takes Atorvastatin daily  . GERD (gastroesophageal reflux disease)     takes Protonix daily  . Vertigo     takes Meclizine daily as needed  . Hypothyroidism     takes Synthroid daily  . Multiple allergies     takes Claritin daily as needed as well as using Flonase if needed  . Itching     takes Atarax daily as needed  . Hypertension     takes Benicar and Diltiazem daily  . Asthma     Symbicort daily and Albuterol daily as needed  . History of migraine     couple of wks ago was the last one  . Joint pain   . Joint swelling   . Bruises easily     d/t being on Plavix   . History of colon polyps     beningn  . Anemia     takes Iron daily  . Muscle spasm     takes Baclofen if needed    Past Surgical History  Procedure Laterality Date  . Tonsillectomy    . Abdominal hysterectomy    . Back surgery    . Knee arthroscopy    . Breast surgery      cyst removal  . Cardiac catheterization    . Trigger thumb    . Carpel tunnel Right   . Total knee arthroplasty Left 06/21/2014    Procedure: LEFT TOTAL KNEE ARTHROPLASTY;  Surgeon: Johnny Bridge, MD;  Location: [facility];  Service: Orthopedics;  Laterality: Left;  . Colonoscopy    . Esophagogastroduodenoscopy     Social History   Social History  . Marital Status: Married    Spouse Name: N/A  . Number of Children: N/A  . Years of Education: N/A   Social History Main Topics  . Smoking status: Never Smoker   . Smokeless tobacco: Not on file  . Alcohol Use: Yes     Comment: social  . Drug Use: No  . Sexual Activity: Not on file   Other Topics Concern  . Not on file   Social History Narrative   No family history on file. Allergies  Allergen Reactions  .  Bee Venom Anaphylaxis  . Diphenhydramine Hcl Other (See Comments)    Paradoxical reaction/ becomes hyper for days  . Penicillins Hives    No reaction to Ancef.  OK to give.    Marland Kitchen Percocet [Oxycodone-Acetaminophen] Other (See Comments)    hallucination  . Propoxyphene   . Sulfa Antibiotics Itching   Prior to Admission medications   Medication Sig Start Date End Date Taking? Authorizing Provider  albuterol (PROVENTIL HFA;VENTOLIN HFA) 108 (90 BASE) MCG/ACT inhaler Inhale 2 puffs into the lungs every 6 (six) hours as needed for wheezing or shortness of breath.   Yes Historical Provider, MD  atorvastatin (LIPITOR) 10 MG tablet Take 10 mg by mouth daily.   Yes Historical Provider, MD  budesonide-formoterol (SYMBICORT) 80-4.5 MCG/ACT inhaler Inhale 2 puffs into the lungs 2 (two) times daily.   Yes Historical Provider, MD  CALCIUM-VITAMIN D PO Take 1 tablet by mouth daily.   Yes Historical Provider, MD  clopidogrel (PLAVIX) 75 MG tablet Take 75 mg by mouth daily.    Yes Historical Provider, MD  diclofenac sodium (VOLTAREN) 1 % GEL Apply 2 g topically 2 (two) times daily. Both knees and lower back   Yes Historical Provider, MD  diltiazem (MATZIM LA) 240 MG 24 hr tablet Take 240 mg by mouth daily.   Yes Historical Provider, MD  EPINEPHrine 0.3 mg/0.3 mL IJ SOAJ injection Inject 0.3 mg into the muscle once.   Yes Historical Provider, MD  fluticasone (FLONASE) 50 MCG/ACT nasal spray Place 1 spray into both nostrils 2 (two) times daily as needed for allergies or rhinitis.   Yes Historical Provider, MD  guaiFENesin (MUCINEX) 600 MG 12 hr tablet Take 600 mg by mouth 2 (two) times daily as needed for to loosen phlegm.   Yes Historical Provider, MD  guaiFENesin-codeine (ROBITUSSIN AC) 100-10 MG/5ML syrup Take 10 mLs by mouth 4 (four) times daily as needed for cough.   Yes Historical Provider, MD  Iron-Vitamin C 65-125 MG TABS Take 1 tablet by mouth 2 (two) times daily.   Yes Historical Provider, MD  levothyroxine (SYNTHROID, LEVOTHROID) 50 MCG tablet Take 50 mcg by mouth daily before breakfast.   Yes Historical Provider, MD  loratadine (CLARITIN) 10 MG tablet Take 10 mg by mouth daily as needed for allergies.   Yes Historical Provider, MD  meclizine (ANTIVERT) 25 MG tablet Take 25 mg by mouth 4 (four) times daily as needed for dizziness.   Yes Historical Provider, MD  Multiple Vitamins-Minerals (MULTIVITAMIN WITH MINERALS) tablet Take 1 tablet by mouth daily.   Yes Historical Provider, MD  olmesartan-hydrochlorothiazide (BENICAR HCT) 40-25 MG per tablet Take 1 tablet by mouth daily.   Yes Historical Provider, MD  Omega-3 Fatty Acids (FISH OIL PO) Take 1 capsule by mouth 2 (two) times daily.   Yes Historical Provider, MD  pantoprazole (PROTONIX) 40 MG tablet Take 40 mg by mouth daily.   Yes Historical Provider, MD  potassium chloride (MICRO-K) 10 MEQ CR capsule Take 30 mEq by mouth 2 (two) times daily.   Yes Historical Provider, MD  pyridOXINE (B-6) 50 MG tablet Take 50  mg by mouth daily.   Yes Historical Provider, MD  torsemide (DEMADEX) 20 MG tablet Take 20 mg by mouth daily.   Yes Historical Provider, MD  baclofen (LIORESAL) 10 MG tablet Take 1 tablet (10 mg total) by mouth 3 (three) times daily. As needed for muscle spasm 06/21/14   Marchia Bond, MD  hydrocortisone 2.5 % cream Apply 1 application topically  2 (two) times daily as needed (rash).    Historical Provider, MD  hydrOXYzine (ATARAX/VISTARIL) 25 MG tablet Take 25 mg by mouth every 8 (eight) hours as needed for itching.    Historical Provider, MD  nystatin cream (MYCOSTATIN) Apply 1 application topically 2 (two) times daily as needed (ffected area).    Historical Provider, MD  ondansetron (ZOFRAN) 4 MG tablet Take 1 tablet (4 mg total) by mouth every 8 (eight) hours as needed for nausea or vomiting. 06/21/14   Marchia Bond, MD  oxyCODONE-acetaminophen (PERCOCET) 10-325 MG per tablet Take 1-2 tablets by mouth every 6 (six) hours as needed for pain. MAXIMUM TOTAL ACETAMINOPHEN DOSE IS 4000 MG PER DAY 06/21/14   Marchia Bond, MD  rivaroxaban (XARELTO) 10 MG TABS tablet Take 1 tablet (10 mg total) by mouth daily. 06/21/14   Marchia Bond, MD  sennosides-docusate sodium (SENOKOT-S) 8.6-50 MG tablet Take 2 tablets by mouth daily. 06/21/14   Marchia Bond, MD  simvastatin (ZOCOR) 20 MG tablet Take 20 mg by mouth daily.    Historical Provider, MD     Positive ROS: All other systems have been reviewed and were otherwise negative with the exception of those mentioned in the HPI and as above.  Physical Exam:  Estimated body mass index is 47.54 kg/(m^2) as calculated from the following:   Height as of this encounter: 5\' 5"  (1.651 m).   Weight as of this encounter: 129.593 kg (285 lb 11.2 oz).  General: Alert, no acute distress Cardiovascular: No pedal edema Respiratory: No cyanosis, no use of accessory musculature GI: No organomegaly, abdomen is soft and non-tender Skin: No lesions in the area of chief  complaint Neurologic: Sensation intact distally Psychiatric: Patient is competent for consent with normal mood and affect Lymphatic: No axillary or cervical lymphadenopathy  MUSCULOSKELETAL: right knee rom 10-110 deg, pos crepitance and pain w rom.    Assessment: DJD RIGHT KNEE   Plan: Plan for Procedure(s): TOTAL KNEE ARTHROPLASTY  The risks benefits and alternatives were discussed with the patient including but not limited to the risks of nonoperative treatment, versus surgical intervention including infection, bleeding, nerve injury,  blood clots, cardiopulmonary complications, morbidity, mortality, among others, and they were willing to proceed.   Johnny Bridge, MD Cell [phone]   03/21/2015 6:24 AM

## 2015-03-21 NOTE — Anesthesia Postprocedure Evaluation (Signed)
  Anesthesia Post-op Note  Patient: Tamara Daugherty  Procedure(s) Performed: Procedure(s): TOTAL KNEE ARTHROPLASTY STEROID INECTION BOTH KNEES (Right)  Patient Location: PACU  Anesthesia Type:General and block  Level of Consciousness: awake and alert   Airway and Oxygen Therapy: Patient Spontanous Breathing  Post-op Pain: Controlled  Post-op Assessment: Post-op Vital signs reviewed, Patient's Cardiovascular Status Stable and Respiratory Function Stable  Post-op Vital Signs: Reviewed  Filed Vitals:   03/21/15 1118  BP:   Pulse:   Temp: 37.1 C  Resp:     Complications: No apparent anesthesia complications

## 2015-03-22 ENCOUNTER — Encounter (HOSPITAL_COMMUNITY): Payer: Self-pay | Admitting: Orthopedic Surgery

## 2015-03-22 LAB — BASIC METABOLIC PANEL
Anion gap: 7 (ref 5–15)
BUN: 18 mg/dL (ref 6–20)
CO2: 27 mmol/L (ref 22–32)
Calcium: 8.6 mg/dL — ABNORMAL LOW (ref 8.9–10.3)
Chloride: 95 mmol/L — ABNORMAL LOW (ref 101–111)
Creatinine, Ser: 1.42 mg/dL — ABNORMAL HIGH (ref 0.44–1.00)
GFR calc Af Amer: 45 mL/min — ABNORMAL LOW (ref >60)
GFR calc non Af Amer: 39 mL/min — ABNORMAL LOW (ref >60)
Glucose, Bld: 133 mg/dL — ABNORMAL HIGH (ref 65–99)
Potassium: 4.3 mmol/L (ref 3.5–5.1)
Sodium: 129 mmol/L — ABNORMAL LOW (ref 135–145)

## 2015-03-22 LAB — CBC
HCT: 30.3 % — ABNORMAL LOW (ref 36.0–46.0)
Hemoglobin: 10 g/dL — ABNORMAL LOW (ref 12.0–15.0)
MCH: 29.4 pg (ref 26.0–34.0)
MCHC: 33 g/dL (ref 30.0–36.0)
MCV: 89.1 fL (ref 78.0–100.0)
Platelets: 266 10*3/uL (ref 150–400)
RBC: 3.4 MIL/uL — ABNORMAL LOW (ref 3.87–5.11)
RDW: 15.4 % (ref 11.5–15.5)
WBC: 10.9 10*3/uL — ABNORMAL HIGH (ref 4.0–10.5)

## 2015-03-22 MED ORDER — ALBUTEROL SULFATE (2.5 MG/3ML) 0.083% IN NEBU
2.5000 mg | INHALATION_SOLUTION | Freq: Four times a day (QID) | RESPIRATORY_TRACT | Status: DC | PRN
  Administered 2015-03-22: 2.5 mg via RESPIRATORY_TRACT
  Filled 2015-03-22: qty 3

## 2015-03-22 NOTE — Progress Notes (Signed)
Physical Therapy Treatment Patient Details Name: SARITA HAKANSON MRN: [id number] DOB: 09/27/1950 Today's Date: 03/22/2015    History of Present Illness Patient is a 64 y/o female s/p R TKA and steriod injections in Bil knees. PMH includes TIA, HTN, asthma, anxiety, back surgery, breast surgery, L TKA 05/2014, carpal tunnel surgery and thumb surgery.    PT Comments    Patient progressing well towards PT goals. Improved ambulation distance today with Min guard assist for safety. Instructed pt in exercises. Will provide handout in PM session. Will plan for bed mobility and gait training in PM session as tolerated. Will follow acutely to maximize independence and mobility prior to return home.   Follow Up Recommendations  Home health PT;Supervision/Assistance - 24 hour     Equipment Recommendations  None recommended by PT    Recommendations for Other Services       Precautions / Restrictions Precautions Precautions: Knee Precaution Booklet Issued: No Precaution Comments: Reviewed no pillow under knee and precautions Restrictions Weight Bearing Restrictions: Yes RLE Weight Bearing: Weight bearing as tolerated    Mobility  Bed Mobility               General bed mobility comments: Pt sitting in recliner upon PT arrival.   Transfers Overall transfer level: Needs assistance Equipment used: Rolling walker (2 wheeled) Transfers: Sit to/from Stand Sit to Stand: Min guard         General transfer comment: Min guard for safety. Stood from Youth worker. Cues for controlled descent into chair.  Ambulation/Gait Ambulation/Gait assistance: Min guard Ambulation Distance (Feet): 75 Feet Assistive device: Rolling walker (2 wheeled) Gait Pattern/deviations: Decreased stance time - right;Decreased step length - left;Antalgic;Decreased stride length;Step-to pattern;Step-through pattern   Gait velocity interpretation: <1.8 ft/sec, indicative of risk for recurrent falls General Gait  Details: Cues for knee extension during stance phase of gait to activate quads.   Stairs            Wheelchair Mobility    Modified Rankin (Stroke Patients Only)       Balance Overall balance assessment: Needs assistance Sitting-balance support: Feet supported;No upper extremity supported Sitting balance-Leahy Scale: Good     Standing balance support: During functional activity Standing balance-Leahy Scale: Poor Standing balance comment: RW for support                    Cognition Arousal/Alertness: Awake/alert Behavior During Therapy: WFL for tasks assessed/performed Overall Cognitive Status: Within Functional Limits for tasks assessed                      Exercises Total Joint Exercises Quad Sets: Both;10 reps;Seated Heel Slides: Right;10 reps;Seated Hip ABduction/ADduction: Right;10 reps;Seated Goniometric ROM: 2-80 degrees knee AROM    General Comments General comments (skin integrity, edema, etc.): Husband present during OT eval      Pertinent Vitals/Pain Pain Assessment: 0-10 Pain Score: 7  Pain Location: right knee Pain Descriptors / Indicators: Sore Pain Intervention(s): Monitored during session;Repositioned;Ice applied    Home Living Family/patient expects to be discharged to:: Unsure Living Arrangements: Spouse/significant other Available Help at Discharge: Family;Available PRN/intermittently (Per PT, spouse taking off the week to provide 24/7 S) Type of Home: House Home Access: Stairs to enter Entrance Stairs-Rails: None Home Layout: One level Home Equipment: Cane - single point;Walker - 2 wheels;Bedside commode;Shower seat - built in;Grab bars - tub/shower      Prior Function Level of Independence: Independent with assistive device(s)  Comments: Pt using SPC for ambulation PTA, drives.    PT Goals (current goals can now be found in the care plan section) Acute Rehab PT Goals Patient Stated Goal: to return to  [facility] Progress towards PT goals: Progressing toward goals    Frequency  7X/week    PT Plan Current plan remains appropriate    Co-evaluation             End of Session Equipment Utilized During Treatment: Gait belt Activity Tolerance: Patient tolerated treatment well Patient left: in chair;with call bell/phone within reach     Time: 1047-1107 PT Time Calculation (min) (ACUTE ONLY): 20 min  Charges:  $Gait Training: 8-22 mins                    G Codes:      Rashunda Passon A Jontae Adebayo 03/22/2015, 11:27 AM Wray [place], [place], DPT [phone]

## 2015-03-22 NOTE — Discharge Instructions (Signed)
INSTRUCTIONS AFTER JOINT REPLACEMENT  ° °o Remove items at home which could result in a fall. This includes throw rugs or furniture in walking pathways °o ICE to the affected joint every three hours while awake for 30 minutes at a time, for at least the first 3-5 days, and then as needed for pain and swelling.  Continue to use ice for pain and swelling. You may notice swelling that will progress down to the foot and ankle.  This is normal after surgery.  Elevate your leg when you are not up walking on it.   °o Continue to use the breathing machine you got in the hospital (incentive spirometer) which will help keep your temperature down.  It is common for your temperature to cycle up and down following surgery, especially at night when you are not up moving around and exerting yourself.  The breathing machine keeps your lungs expanded and your temperature down. ° ° °DIET:  As you were doing prior to hospitalization, we recommend a well-balanced diet. ° °DRESSING / WOUND CARE / SHOWERING ° °You may change your dressing 3-5 days after surgery.  Then change the dressing every day with sterile gauze.  Please use good hand washing techniques before changing the dressing.  Do not use any lotions or creams on the incision until instructed by your surgeon. ° °ACTIVITY ° °o Increase activity slowly as tolerated, but follow the weight bearing instructions below.   °o No driving for 6 weeks or until further direction given by your physician.  You cannot drive while taking narcotics.  °o No lifting or carrying greater than 10 lbs. until further directed by your surgeon. °o Avoid periods of inactivity such as sitting longer than an hour when not asleep. This helps prevent blood clots.  °o You may return to work once you are authorized by your doctor.  ° ° ° °WEIGHT BEARING  ° °Weight bearing as tolerated with assist device (walker, cane, etc) as directed, use it as long as suggested by your surgeon or therapist, typically at  least 4-6 weeks. ° ° °EXERCISES ° °Results after joint replacement surgery are often greatly improved when you follow the exercise, range of motion and muscle strengthening exercises prescribed by your doctor. Safety measures are also important to protect the joint from further injury. Any time any of these exercises cause you to have increased pain or swelling, decrease what you are doing until you are comfortable again and then slowly increase them. If you have problems or questions, call your caregiver or physical therapist for advice.  ° °Rehabilitation is important following a joint replacement. After just a few days of immobilization, the muscles of the leg can become weakened and shrink (atrophy).  These exercises are designed to build up the tone and strength of the thigh and leg muscles and to improve motion. Often times heat used for twenty to thirty minutes before working out will loosen up your tissues and help with improving the range of motion but do not use heat for the first two weeks following surgery (sometimes heat can increase post-operative swelling).  ° °These exercises can be done on a training (exercise) mat, on the floor, on a table or on a bed. Use whatever works the best and is most comfortable for you.    Use music or television while you are exercising so that the exercises are a pleasant break in your day. This will make your life better with the exercises acting as a break   in your routine that you can look forward to.   Perform all exercises about fifteen times, three times per day or as directed.  You should exercise both the operative leg and the other leg as well. ° °Exercises include: °  °• Quad Sets - Tighten up the muscle on the front of the thigh (Quad) and hold for 5-10 seconds.   °• Straight Leg Raises - With your knee straight (if you were given a brace, keep it on), lift the leg to 60 degrees, hold for 3 seconds, and slowly lower the leg.  Perform this exercise against  resistance later as your leg gets stronger.  °• Leg Slides: Lying on your back, slowly slide your foot toward your buttocks, bending your knee up off the floor (only go as far as is comfortable). Then slowly slide your foot back down until your leg is flat on the floor again.  °• Angel Wings: Lying on your back spread your legs to the side as far apart as you can without causing discomfort.  °• Hamstring Strength:  Lying on your back, push your heel against the floor with your leg straight by tightening up the muscles of your buttocks.  Repeat, but this time bend your knee to a comfortable angle, and push your heel against the floor.  You may put a pillow under the heel to make it more comfortable if necessary.  ° °A rehabilitation program following joint replacement surgery can speed recovery and prevent re-injury in the future due to weakened muscles. Contact your doctor or a physical therapist for more information on knee rehabilitation.  ° ° °CONSTIPATION ° °Constipation is defined medically as fewer than three stools per week and severe constipation as less than one stool per week.  Even if you have a regular bowel pattern at home, your normal regimen is likely to be disrupted due to multiple reasons following surgery.  Combination of anesthesia, postoperative narcotics, change in appetite and fluid intake all can affect your bowels.  ° °YOU MUST use at least one of the following options; they are listed in order of increasing strength to get the job done.  They are all available over the counter, and you may need to use some, POSSIBLY even all of these options:   ° °Drink plenty of fluids (prune juice may be helpful) and high fiber foods °Colace 100 mg by mouth twice a day  °Senokot for constipation as directed and as needed Dulcolax (bisacodyl), take with full glass of water  °Miralax (polyethylene glycol) once or twice a day as needed. ° °If you have tried all these things and are unable to have a bowel  movement in the first 3-4 days after surgery call either your surgeon or your primary doctor.   ° °If you experience loose stools or diarrhea, hold the medications until you stool forms back up.  If your symptoms do not get better within 1 week or if they get worse, check with your doctor.  If you experience "the worst abdominal pain ever" or develop nausea or vomiting, please contact the office immediately for further recommendations for treatment. ° ° °ITCHING:  If you experience itching with your medications, try taking only a single pain pill, or even half a pain pill at a time.  You can also use Benadryl over the counter for itching or also to help with sleep.  ° °TED HOSE STOCKINGS:  Use stockings on both legs until for at least 2 weeks or as   directed by physician office. They may be removed at night for sleeping. ° °MEDICATIONS:  See your medication summary on the “After Visit Summary” that nursing will review with you.  You may have some home medications which will be placed on hold until you complete the course of blood thinner medication.  It is important for you to complete the blood thinner medication as prescribed. ° °PRECAUTIONS:  If you experience chest pain or shortness of breath - call 911 immediately for transfer to the hospital emergency department.  ° °If you develop a fever greater that 101 F, purulent drainage from wound, increased redness or drainage from wound, foul odor from the wound/dressing, or calf pain - CONTACT YOUR SURGEON.   °                                                °FOLLOW-UP APPOINTMENTS:  If you do not already have a post-op appointment, please call the office for an appointment to be seen by your surgeon.  Guidelines for how soon to be seen are listed in your “After Visit Summary”, but are typically between 1-4 weeks after surgery. ° °OTHER INSTRUCTIONS:  ° °Knee Replacement:  Do not place pillow under knee, focus on keeping the knee straight while resting. CPM  instructions: 0-90 degrees, 2 hours in the morning, 2 hours in the afternoon, and 2 hours in the evening. Place foam block, curve side up under heel at all times except when in CPM or when walking.  DO NOT modify, tear, cut, or change the foam block in any way. ° °MAKE SURE YOU:  °• Understand these instructions.  °• Get help right away if you are not doing well or get worse.  ° ° °Thank you for letting us be a part of your medical care team.  It is a privilege we respect greatly.  We hope these instructions will help you stay on track for a fast and full recovery!  ° °Information on my medicine - XARELTO® (Rivaroxaban) ° °This medication education was reviewed with me or my healthcare representative as part of my discharge preparation.  The pharmacist that spoke with me during my hospital stay was:  Clarkson Rosselli Kay, RPH ° °Why was Xarelto® prescribed for you? °Xarelto® was prescribed for you to reduce the risk of blood clots forming after orthopedic surgery. The medical term for these abnormal blood clots is venous thromboembolism (VTE). ° °What do you need to know about xarelto® ? °Take your Xarelto® ONCE DAILY at the same time every day. °You may take it either with or without food. ° °If you have difficulty swallowing the tablet whole, you may crush it and mix in applesauce just prior to taking your dose. ° °Take Xarelto® exactly as prescribed by your doctor and DO NOT stop taking Xarelto® without talking to the doctor who prescribed the medication.  Stopping without other VTE prevention medication to take the place of Xarelto® may increase your risk of developing a clot. ° °After discharge, you should have regular check-up appointments with your healthcare provider that is prescribing your Xarelto®.   ° °What do you do if you miss a dose? °If you miss a dose, take it as soon as you remember on the same day then continue your regularly scheduled once daily regimen the next day. Do not take two doses of   Xarelto  on the same day.   Important Safety Information A possible side effect of Xarelto is bleeding. You should call your healthcare provider right away if you experience any of the following: ? Bleeding from an injury or your nose that does not stop. ? Unusual colored urine (red or dark brown) or unusual colored stools (red or black). ? Unusual bruising for unknown reasons. ? A serious fall or if you hit your head (even if there is no bleeding).  Some medicines may interact with Xarelto and might increase your risk of bleeding while on Xarelto. To help avoid this, consult your healthcare provider or pharmacist prior to using any new prescription or non-prescription medications, including herbals, vitamins, non-steroidal anti-inflammatory drugs (NSAIDs) and supplements.  This website has more information on Xarelto: [email or web address]

## 2015-03-22 NOTE — Progress Notes (Signed)
Physical Therapy Treatment Patient Details Name: Tamara Daugherty MRN: [id number] DOB: 1951/01/02 Today's Date: 03/22/2015    History of Present Illness Patient is a 64 y/o female s/p R TKA and steriod injections in Bil knees. PMH includes TIA, HTN, asthma, anxiety, back surgery, breast surgery, L TKA 05/2014, carpal tunnel surgery and thumb surgery.    PT Comments    Patient progressing well towards PT goals. Improved ambulation distance with encouragement. Provided handout and instructed pt in exercises. Will plan to practice bed mobility, gait training and stair tomorrow as tolerated. Pt anxious about negotiating steps. Will follow acutely to maximize independence and mobility prior to return home.   Follow Up Recommendations  Home health PT;Supervision/Assistance - 24 hour     Equipment Recommendations  None recommended by PT    Recommendations for Other Services       Precautions / Restrictions Precautions Precautions: Knee Precaution Booklet Issued: Yes (comment) Precaution Comments: Reviewed no pillow under knee and precautions Restrictions Weight Bearing Restrictions: Yes RLE Weight Bearing: Weight bearing as tolerated    Mobility  Bed Mobility               General bed mobility comments: Pt sitting in recliner upon PT arrival.   Transfers Overall transfer level: Needs assistance Equipment used: Rolling walker (2 wheeled) Transfers: Sit to/from Stand Sit to Stand: Supervision         General transfer comment: Supervision for safety. Stood from Youth worker.  Ambulation/Gait Ambulation/Gait assistance: Min guard Ambulation Distance (Feet): 110 Feet Assistive device: Rolling walker (2 wheeled) Gait Pattern/deviations: Decreased stance time - right;Decreased step length - left;Trunk flexed;Antalgic;Step-through pattern;Decreased stride length   Gait velocity interpretation: <1.8 ft/sec, indicative of risk for recurrent falls General Gait Details: Cues for  knee extension during stance phase of gait to activate quads and for heel strike. 1 standing rest break for fatigue.   Stairs            Wheelchair Mobility    Modified Rankin (Stroke Patients Only)       Balance Overall balance assessment: Needs assistance Sitting-balance support: Feet supported;No upper extremity supported Sitting balance-Leahy Scale: Good     Standing balance support: During functional activity Standing balance-Leahy Scale: Poor                      Cognition Arousal/Alertness: Awake/alert Behavior During Therapy: WFL for tasks assessed/performed Overall Cognitive Status: Within Functional Limits for tasks assessed                      Exercises Total Joint Exercises Quad Sets: Both;10 reps;Seated Towel Squeeze: Both;10 reps;Seated Hip ABduction/ADduction: Right;10 reps;Seated Long Arc Quad: Right;10 reps;Seated    General Comments General comments (skin integrity, edema, etc.): Husband and son present during session. Weeping through bandage. Rn reinforced it prior to session. However RN notified that it needed to be reinforced post session as well.      Pertinent Vitals/Pain Pain Assessment: 0-10 Pain Score: 6  Pain Location: right knee Pain Descriptors / Indicators: Sore Pain Intervention(s): Monitored during session;Repositioned    Home Living                      Prior Function            PT Goals (current goals can now be found in the care plan section) Progress towards PT goals: Progressing toward goals    Frequency  7X/week  PT Plan Current plan remains appropriate    Co-evaluation             End of Session Equipment Utilized During Treatment: Gait belt Activity Tolerance: Patient tolerated treatment well Patient left: in chair;with call bell/phone within reach;with family/visitor present     Time: 1540-1603 PT Time Calculation (min) (ACUTE ONLY): 23 min  Charges:  $Gait Training:  8-22 mins $Therapeutic Exercise: 8-22 mins                    G Codes:      Annebelle Bostic A Jiaire Rosebrook 03/22/2015, 4:27 PM Wray Kearns, [place], DPT [phone]

## 2015-03-22 NOTE — Evaluation (Signed)
Occupational Therapy Evaluation Patient Details Name: Tamara Daugherty MRN: [id number] DOB: 28-Aug-1950 Today's Date: 03/22/2015    History of Present Illness Patient is a 64 y/o female s/p R TKA and steriod injections in Bil knees. PMH includes TIA, HTN, asthma, anxiety, back surgery, breast surgery, L TKA 05/2014, carpal tunnel surgery and thumb surgery.   Clinical Impression   Pt reports she was independent with ADLs PTA. Pt worried about d/c directly home from hospital due to husband working part time, per PT eval husband is taking off a week and will be able to provide 24/7 supervision. Educated pt on compensatory strategies for LB ADLs, transfer technique with RW, walk in shower transfer technique; pt verbalized understanding and remembers from last knee replacement. Recommending d/c home with 24/7 supervision/assist. Pt would benefit from continued acute OT services in order to maximize independence and safety with toilet transfers and LB ADLs.     Follow Up Recommendations  No OT follow up;Supervision/Assistance - 24 hour    Equipment Recommendations  None recommended by OT    Recommendations for Other Services       Precautions / Restrictions Precautions Precautions: Knee Restrictions Weight Bearing Restrictions: Yes RLE Weight Bearing: Weight bearing as tolerated      Mobility Bed Mobility               General bed mobility comments: Pt in recliner, returned to recliner at end of session  Transfers Overall transfer level: Needs assistance Equipment used: Rolling walker (2 wheeled) Transfers: Sit to/from Stand Sit to Stand: Min assist         General transfer comment: Min A to boost from chair.     Balance Overall balance assessment: Needs assistance         Standing balance support: Bilateral upper extremity supported Standing balance-Leahy Scale: Poor Standing balance comment: RW for support                            ADL Overall ADL's  : Needs assistance/impaired Eating/Feeding: Set up;Sitting   Grooming: Set up;Sitting   Upper Body Bathing: Supervision/ safety;Sitting   Lower Body Bathing: Moderate assistance;Sit to/from stand   Upper Body Dressing : Supervision/safety;Sitting   Lower Body Dressing: Moderate assistance;Sit to/from stand   Toilet Transfer: Min guard;Ambulation;Comfort height toilet;RW   Toileting- Water quality scientist and Hygiene: Min guard;Sit to/from stand   Tub/ Shower Transfer: Min guard;Ambulation;Shower seat;Grab bars;Rolling walker   Functional mobility during ADLs: Min guard General ADL Comments: Pt reports she had other knee replaced last year. Pts husband assisted with ADLs following last surgery and reports that he can assist this time as well. Pt concerned about returning home, wondering if she should go to rehab then home since her husband is not able to be home 24/7 after next week. Educated pt on compensatory strategies for ADLs, walk in shower transfer technique, transfer technique using RW; pt verbalized understanding.      Vision     Perception     Praxis      Pertinent Vitals/Pain Pain Assessment: 0-10 Pain Score: 5  Pain Location: R knee Pain Descriptors / Indicators: Sore Pain Intervention(s): Monitored during session;Repositioned;Ice applied;Limited activity within patient's tolerance     Hand Dominance Right   Extremity/Trunk Assessment Upper Extremity Assessment Upper Extremity Assessment: Generalized weakness   Lower Extremity Assessment Lower Extremity Assessment: Defer to PT evaluation       Communication Communication Communication: No difficulties  Cognition Arousal/Alertness: Awake/alert Behavior During Therapy: WFL for tasks assessed/performed Overall Cognitive Status: Within Functional Limits for tasks assessed                     General Comments       Exercises       Shoulder Instructions      Home Living Family/patient  expects to be discharged to:: Unsure Living Arrangements: Spouse/significant other Available Help at Discharge: Family;Available PRN/intermittently (Per PT, spouse taking off the week to provide 24/7 S) Type of Home: House Home Access: Stairs to enter [employer] of Steps: 1 Entrance Stairs-Rails: None Home Layout: One level     Bathroom Shower/Tub: Occupational psychologist: Handicapped height     Home Equipment: [employer] - single point;Walker - 2 wheels;Bedside commode;Shower seat - built in;Grab bars - tub/shower          Prior Functioning/Environment Level of Independence: Independent with assistive device(s)        Comments: Pt using SPC for ambulation PTA, drives.     OT Diagnosis: Generalized weakness;Acute pain   OT Problem List: Decreased strength;Decreased activity tolerance;Impaired balance (sitting and/or standing);Decreased safety awareness;Decreased knowledge of use of DME or AE;Decreased knowledge of precautions;Pain   OT Treatment/Interventions: Self-care/ADL training;DME and/or AE instruction;Patient/family education    OT Goals(Current goals can be found in the care plan section) Acute Rehab OT Goals Patient Stated Goal: to return to PLOF OT Goal Formulation: With patient Time For Goal Achievement: 04/05/15 Potential to Achieve Goals: Good ADL Goals Pt Will Perform Grooming: with supervision;standing Pt Will Perform Lower Body Bathing: with supervision;sit to/from stand Pt Will Perform Lower Body Dressing: with supervision;sit to/from stand Pt Will Transfer to Toilet: with supervision;ambulating (handicap height toilet) Pt Will Perform Toileting - Clothing Manipulation and hygiene: with supervision;sit to/from stand Pt Will Perform Tub/Shower Transfer: with supervision;ambulating;shower seat;rolling walker  OT Frequency: Min 2X/week   Barriers to D/C:            Co-evaluation              End of Session Equipment Utilized  During Treatment: Gait belt;Rolling walker  Activity Tolerance: Patient tolerated treatment well Patient left: in chair;with call bell/phone within reach   Time: 6468-0321 OT Time Calculation (min): 25 min Charges:  OT General Charges $OT Visit: 1 Procedure OT Evaluation $Initial OT Evaluation Tier I: 1 Procedure OT Treatments $Self Care/Home Management : 8-22 mins G-Codes:     Binnie Kand M.S., OTR/L Pager: [phone]  03/22/2015, 10:39 AM

## 2015-03-22 NOTE — Progress Notes (Signed)
Patient ID: Tamara Daugherty, female   DOB: [birth year], 64 y.o.   MRN: [id number]     Subjective:  Patient reports pain as mild.  Patient sitting up in bed and is in no acute distress.  She denies any CP or SOB  Objective:   VITALS:   Filed Vitals:   03/21/15 1330 03/21/15 1946 03/22/15 0149 03/22/15 0550  BP: 137/50 114/58 103/55 104/52  Pulse: 71 85 85 78  Temp: 98.6 F (37 C) 98.4 F (36.9 C) 99.4 F (37.4 C) 98.6 F (37 C)  TempSrc:      Resp: 14 16 16 16   Height:      Weight:      SpO2: 96% 96% 94% 96%    ABD soft Sensation intact distally Dorsiflexion/Plantar flexion intact Incision: dressing C/D/I and no drainage Good foot and ankle motion  Lab Results  Component Value Date   WBC 10.9* 03/22/2015   HGB 10.0* 03/22/2015   HCT 30.3* 03/22/2015   MCV 89.1 03/22/2015   PLT 266 03/22/2015   BMET    Component Value Date/Time   NA 138 03/08/2015 1041   K 3.8 03/08/2015 1041   CL 101 03/08/2015 1041   CO2 30 03/08/2015 1041   GLUCOSE 102* 03/08/2015 1041   BUN 18 03/08/2015 1041   CREATININE 1.29* 03/08/2015 1041   CALCIUM 9.5 03/08/2015 1041   GFRNONAA 43* 03/08/2015 1041   GFRAA 50* 03/08/2015 1041     Assessment/Plan: 1 Day Post-Op   Principal Problem:   Primary localized osteoarthritis of right knee Active Problems:   Knee osteoarthritis   Advance diet Up with therapy Patient wanting to go home tomorrow or Friday WBAT Dry dressing PRN    Rande Brunt, BRANDON 03/22/2015, 7:25 AM  Seen and agree.    Marchia Bond, MD Cell [phone]

## 2015-03-22 NOTE — Care Management Note (Signed)
Case Management Note  Patient Details  Name: Tamara Daugherty MRN: [id number] Date of Birth: 06-30-1950  Subjective/Objective:   64 yr old female s/p right total knee arthroplasty.               Action/Plan: Case manager spoke with patient and her husband concerning discharge plan and home health needs. Choice was offered. Patient states she lives in [place] and wants to use [facility]. Case manager contacted [phone], faxed orders and demographics to her at [phone]. Valetta Fuller called to confirm they will provide [facility] therapy for patient. Case manager also informed her that patient requests same therapist she had previously-Shannon. They will attempt to comply with this request.   Expected Discharge Date:   03/23/15                Expected Discharge Plan:  [place]  In-House Referral:  NA  Discharge planning Services  CM Consult  Post Acute Care Choice:  Home Health Choice offered to:  Patient  DME Arranged:  N/A DME Agency:  NA  HH Arranged:  PT HH Agency:  [facility]  Status of Service:  Completed, signed off  Medicare Important Message Given:    Date Medicare IM Given:    Medicare IM give by:    Date Additional Medicare IM Given:    Additional Medicare Important Message give by:     If discussed at [place] of Stay Meetings, dates discussed:    Additional Comments:  Ninfa Meeker, RN 03/22/2015, 3:52 PM

## 2015-03-22 NOTE — Plan of Care (Signed)
Problem: Consults Goal: Diagnosis- Total Joint Replacement Primary Total Knee     

## 2015-03-23 LAB — CBC
HCT: 26.8 % — ABNORMAL LOW (ref 36.0–46.0)
Hemoglobin: 8.8 g/dL — ABNORMAL LOW (ref 12.0–15.0)
MCH: 29.1 pg (ref 26.0–34.0)
MCHC: 32.8 g/dL (ref 30.0–36.0)
MCV: 88.7 fL (ref 78.0–100.0)
Platelets: 246 10*3/uL (ref 150–400)
RBC: 3.02 MIL/uL — ABNORMAL LOW (ref 3.87–5.11)
RDW: 15.6 % — ABNORMAL HIGH (ref 11.5–15.5)
WBC: 11 10*3/uL — ABNORMAL HIGH (ref 4.0–10.5)

## 2015-03-23 MED ORDER — DILTIAZEM HCL ER COATED BEADS 240 MG PO CP24
240.0000 mg | ORAL_CAPSULE | Freq: Every day | ORAL | Status: DC
  Filled 2015-03-23: qty 1

## 2015-03-23 MED ORDER — GUAIFENESIN-CODEINE 100-10 MG/5ML PO SOLN
10.0000 mL | Freq: Four times a day (QID) | ORAL | Status: DC | PRN
  Filled 2015-03-23: qty 10

## 2015-03-23 NOTE — Discharge Summary (Signed)
Physician Discharge Summary  Patient ID: Tamara Daugherty MRN: [id number] DOB/AGE: 62-Jan-1952 62 y.o.  Admit date: 03/21/2015 Discharge date: 03/23/2015  Admission Diagnoses:  Primary localized osteoarthritis of right knee  Discharge Diagnoses:  Principal Problem:   Primary localized osteoarthritis of right knee Active Problems:   Knee osteoarthritis   Past Medical History  Diagnosis Date  . Complication of anesthesia     long time to wake up - referred to pulmonary after carpel tunnel procedure - for asthma and undiagnosed sleep apnea  . TIA (transient ischemic attack)     takes Plavix daily;notices right side is slightly weaker than left  . Heart murmur     [facility] - [place] [facility]  . Sleep apnea     cpap  . Pneumonia     hx of  . Anxiety   . Headache   . Arthritis   . History of blood transfusion     no abnormal reaction noted  . Primary localized osteoarthritis of left knee 06/21/2014  . Peripheral edema     takes Torsemide daily  . Hyperlipidemia     takes Atorvastatin daily  . GERD (gastroesophageal reflux disease)     takes Protonix daily  . Vertigo     takes Meclizine daily as needed  . Hypothyroidism     takes Synthroid daily  . Multiple allergies     takes Claritin daily as needed as well as using Flonase if needed  . Itching     takes Atarax daily as needed  . Hypertension     takes Benicar and Diltiazem daily  . Asthma     Symbicort daily and Albuterol daily as needed  . History of migraine     couple of wks ago was the last one  . Joint pain   . Joint swelling   . Bruises easily     d/t being on Plavix   . History of colon polyps     beningn  . Anemia     takes Iron daily  . Muscle spasm     takes Baclofen if needed  . Primary localized osteoarthritis of right knee 03/21/2015    Surgeries: Procedure(s): TOTAL KNEE ARTHROPLASTY STEROID INECTION BOTH KNEES on 03/21/2015   Consultants (if any):    Discharged Condition: Improved  Hospital  Course: [name] is an 64 y.o. female who was admitted 03/21/2015 with a diagnosis of Primary localized osteoarthritis of right knee and went to the operating room on 03/21/2015 and underwent the above named procedures.    She was given perioperative antibiotics:  Anti-infectives    Start     Dose/Rate Route Frequency Ordered Stop   03/21/15 1400  ceFAZolin (ANCEF) IVPB 2 g/50 mL premix     2 g 100 mL/hr over 30 Minutes Intravenous Every 6 hours 03/21/15 1141 03/21/15 2127   03/21/15 0645  ceFAZolin (ANCEF) 3 g in dextrose 5 % 50 mL IVPB     3 g 160 mL/hr over 30 Minutes Intravenous To ShortStay Surgical 03/20/15 1054 03/21/15 0732   03/20/15 1100  ceFAZolin (ANCEF) 3 g in dextrose 5 % 50 mL IVPB  Status:  Discontinued     3 g 160 mL/hr over 30 Minutes Intravenous To ShortStay Surgical 03/20/15 1049 03/20/15 1054    .  She was given sequential compression devices, early ambulation, and xarelto for DVT prophylaxis.  She had some drainage on POD 2 and her dressings were changed and her wounds were clean.  She benefited maximally from the hospital stay and there were no complications.    Recent vital signs:  Filed Vitals:   03/23/15 0559  BP: 101/45  Pulse: 67  Temp: 98.7 F (37.1 C)  Resp: 18    Recent laboratory studies:  Lab Results  Component Value Date   HGB 8.8* 03/23/2015   HGB 10.0* 03/22/2015   HGB 12.0 03/08/2015   Lab Results  Component Value Date   WBC 11.0* 03/23/2015   PLT 246 03/23/2015   Lab Results  Component Value Date   INR 1.02 06/09/2014   Lab Results  Component Value Date   NA 129* 03/22/2015   K 4.3 03/22/2015   CL 95* 03/22/2015   CO2 27 03/22/2015   BUN 18 03/22/2015   CREATININE 1.42* 03/22/2015   GLUCOSE 133* 03/22/2015    Discharge Medications:     Medication List    STOP taking these medications        clopidogrel 75 MG tablet  Commonly known as:  PLAVIX     oxyCODONE-acetaminophen 10-325 MG tablet  Commonly known as:   PERCOCET      TAKE these medications        albuterol 108 (90 BASE) MCG/ACT inhaler  Commonly known as:  PROVENTIL HFA;VENTOLIN HFA  Inhale 2 puffs into the lungs every 6 (six) hours as needed for wheezing or shortness of breath.     atorvastatin 10 MG tablet  Commonly known as:  LIPITOR  Take 10 mg by mouth daily.     baclofen 10 MG tablet  Commonly known as:  LIORESAL  Take 1 tablet (10 mg total) by mouth 3 (three) times daily. As needed for muscle spasm     budesonide-formoterol 80-4.5 MCG/ACT inhaler  Commonly known as:  SYMBICORT  Inhale 2 puffs into the lungs 2 (two) times daily.     CALCIUM-VITAMIN D PO  Take 1 tablet by mouth daily.     diclofenac sodium 1 % Gel  Commonly known as:  VOLTAREN  Apply 2 g topically 2 (two) times daily. Both knees and lower back     EPINEPHrine 0.3 mg/0.3 mL Soaj injection  Commonly known as:  EPI-PEN  Inject 0.3 mg into the muscle once.     FISH OIL PO  Take 1 capsule by mouth 2 (two) times daily.     fluticasone 50 MCG/ACT nasal spray  Commonly known as:  FLONASE  Place 1 spray into both nostrils 2 (two) times daily as needed for allergies or rhinitis.     guaiFENesin 600 MG 12 hr tablet  Commonly known as:  MUCINEX  Take 600 mg by mouth 2 (two) times daily as needed for to loosen phlegm.     guaiFENesin-codeine 100-10 MG/5ML syrup  Commonly known as:  ROBITUSSIN AC  Take 10 mLs by mouth 4 (four) times daily as needed for cough.     HYDROcodone-acetaminophen 10-325 MG tablet  Commonly known as:  NORCO  Take 1-2 tablets by mouth every 6 (six) hours as needed.     hydrocortisone 2.5 % cream  Apply 1 application topically 2 (two) times daily as needed (rash).     hydrOXYzine 25 MG tablet  Commonly known as:  ATARAX/VISTARIL  Take 25 mg by mouth every 8 (eight) hours as needed for itching.     Iron-Vitamin C 65-125 MG Tabs  Take 1 tablet by mouth 2 (two) times daily.     levothyroxine 50 MCG tablet  Commonly known as:  SYNTHROID, LEVOTHROID  Take 50 mcg by mouth daily before breakfast.     loratadine 10 MG tablet  Commonly known as:  CLARITIN  Take 10 mg by mouth daily as needed for allergies.     MATZIM LA 240 MG 24 hr tablet  Generic drug:  diltiazem  Take 240 mg by mouth daily.     meclizine 25 MG tablet  Commonly known as:  ANTIVERT  Take 25 mg by mouth 4 (four) times daily as needed for dizziness.     multivitamin with minerals tablet  Take 1 tablet by mouth daily.     nystatin cream  Commonly known as:  MYCOSTATIN  Apply 1 application topically 2 (two) times daily as needed (ffected area).     olmesartan-hydrochlorothiazide 40-25 MG tablet  Commonly known as:  BENICAR HCT  Take 1 tablet by mouth daily.     ondansetron 4 MG tablet  Commonly known as:  ZOFRAN  Take 1 tablet (4 mg total) by mouth every 8 (eight) hours as needed for nausea or vomiting.     pantoprazole 40 MG tablet  Commonly known as:  PROTONIX  Take 40 mg by mouth daily.     potassium chloride 10 MEQ CR capsule  Commonly known as:  MICRO-K  Take 30 mEq by mouth 2 (two) times daily.     pyridOXINE 50 MG tablet  Commonly known as:  B-6  Take 50 mg by mouth daily.     rivaroxaban 10 MG Tabs tablet  Commonly known as:  XARELTO  Take 1 tablet (10 mg total) by mouth daily.     sennosides-docusate sodium 8.6-50 MG tablet  Commonly known as:  SENOKOT-S  Take 2 tablets by mouth daily.     simvastatin 20 MG tablet  Commonly known as:  ZOCOR  Take 20 mg by mouth daily.     torsemide 20 MG tablet  Commonly known as:  DEMADEX  Take 20 mg by mouth daily.        Diagnostic Studies: Dg Knee Right Port  03/21/2015   CLINICAL DATA:  Right knee replacement.  EXAM: PORTABLE RIGHT KNEE - 1-2 VIEW  COMPARISON:  None.  FINDINGS: Patient has undergone total right knee arthroplasty with prosthetic components intact and normally located. Minimal air in the soft tissues compatible recent surgery. No complicating features  identified.  IMPRESSION: Post right total knee arthroplasty without complicating features.   Electronically Signed   By: Marin Olp M.D.   On: 03/21/2015 11:43    Disposition: 06-Home-Health Care Svc        Follow-up Information    Follow up with Johnny Bridge, MD. Schedule an appointment as soon as possible for a visit in 2 weeks.   Specialty:  Orthopedic Surgery   Contact information:   [place] [id number] [phone]       Follow up with [facility] .   Why:  Someone from [facility] will contact you concerning start date and time for therapy.   Contact information:   [phone]       Signed: Johnny Bridge 03/23/2015, 10:24 AM

## 2015-03-23 NOTE — Progress Notes (Signed)
Patient is discharged form room 5N12 at this time. Alert and in stable condition. IV site d/c'd. Instructions read to patient and understanding verbalized. Left unit via wheelchair with all belongings and husband at side.

## 2015-03-23 NOTE — Progress Notes (Signed)
Physical Therapy Treatment Patient Details Name: Tamara Daugherty MRN: [id number] DOB: 11/13/1950 Today's Date: 03/23/2015    History of Present Illness Patient is a 64 y/o female s/p R TKA and steriod injections in Bil knees. PMH includes TIA, HTN, asthma, anxiety, back surgery, breast surgery, L TKA 05/2014, carpal tunnel surgery and thumb surgery.    PT Comments    Patient is making good progress with PT.  From a mobility standpoint anticipate patient will be ready for DC home with family assistance. Patient denies any questions or concerns follow session.    .     Follow Up Recommendations  Home health PT;Supervision/Assistance - 24 hour     Equipment Recommendations  None recommended by PT    Recommendations for Other Services       Precautions / Restrictions Precautions Precautions: Knee Precaution Booklet Issued: Yes (comment) Required Braces or Orthoses: Knee Immobilizer - Right Knee Immobilizer - Right: On when out of bed or walking Restrictions Weight Bearing Restrictions: Yes RLE Weight Bearing: Weight bearing as tolerated    Mobility  Bed Mobility               General bed mobility comments: found and returned to sitting, denied concerns with getting out of bed.   Transfers Overall transfer level: Needs assistance Equipment used: Rolling walker (2 wheeled) Transfers: Sit to/from Stand Sit to Stand: Supervision         General transfer comment: consistent technique, no cues needed.   Ambulation/Gait Ambulation/Gait assistance: Supervision Ambulation Distance (Feet): 125 Feet Assistive device: Rolling walker (2 wheeled) Gait Pattern/deviations: Step-through pattern;Decreased weight shift to right;Decreased stance time - right Gait velocity: decreased   General Gait Details: cues for knee flexion with swing phase on Rt.    Stairs Stairs: Yes Stairs assistance: Min guard Stair Management: No rails;Backwards;Forwards;With walker (up backwards,  down forwards) Number of Stairs: 1 General stair comments: Patient states that she feels confident with stairs.   Wheelchair Mobility    Modified Rankin (Stroke Patients Only)       Balance Overall balance assessment: Needs assistance Sitting-balance support: No upper extremity supported Sitting balance-Leahy Scale: Good     Standing balance support: During functional activity Standing balance-Leahy Scale: Fair Standing balance comment: using rw                    Cognition Arousal/Alertness: Awake/alert Behavior During Therapy: WFL for tasks assessed/performed Overall Cognitive Status: Within Functional Limits for tasks assessed                      Exercises Total Joint Exercises Ankle Circles/Pumps: Both;10 reps;Supine Quad Sets: Both;10 reps;Seated Heel Slides: Right;10 reps;Seated Long Arc Quad: Right;10 reps;Seated Goniometric ROM: 46 degrees flexion, limited by reports of pain.     General Comments        Pertinent Vitals/Pain Pain Assessment: 0-10 Pain Score: 5  Pain Location: Rt knee Pain Descriptors / Indicators: Sore Pain Intervention(s): Limited activity within patient's tolerance;Monitored during session    Home Living                      Prior Function            PT Goals (current goals can now be found in the care plan section) Acute Rehab PT Goals Patient Stated Goal: go home today PT Goal Formulation: With patient Time For Goal Achievement: 04/04/15 Potential to Achieve Goals: Good Progress towards PT goals:  Progressing toward goals    Frequency  7X/week    PT Plan Current plan remains appropriate    Co-evaluation             End of Session Equipment Utilized During Treatment: Gait belt;Right knee immobilizer Activity Tolerance: Patient tolerated treatment well Patient left: in chair;with call bell/phone within reach;with family/visitor present     Time: 2778-2423 PT Time Calculation (min)  (ACUTE ONLY): 24 min  Charges:  $Gait Training: 8-22 mins $Therapeutic Exercise: 8-22 mins                    G Codes:      Cassell Clement, PT, CSCS Pager [phone] Office [phone]  03/23/2015, 3:16 PM

## 2016-08-30 ENCOUNTER — Ambulatory Visit (INDEPENDENT_AMBULATORY_CARE_PROVIDER_SITE_OTHER): Payer: BLUE CROSS/BLUE SHIELD | Admitting: Pulmonary Disease

## 2016-08-30 ENCOUNTER — Ambulatory Visit (INDEPENDENT_AMBULATORY_CARE_PROVIDER_SITE_OTHER)
Admission: RE | Admit: 2016-08-30 | Discharge: 2016-08-30 | Disposition: A | Discharge status: Home / Self Care | Payer: BLUE CROSS/BLUE SHIELD | Source: Ambulatory Visit | Attending: Pulmonary Disease | Admitting: Pulmonary Disease

## 2016-08-30 ENCOUNTER — Other Ambulatory Visit (INDEPENDENT_AMBULATORY_CARE_PROVIDER_SITE_OTHER): Payer: BLUE CROSS/BLUE SHIELD

## 2016-08-30 ENCOUNTER — Encounter: Payer: Self-pay | Admitting: Pulmonary Disease

## 2016-08-30 DIAGNOSIS — J4550 Severe persistent asthma, uncomplicated: Secondary | ICD-10-CM | POA: Insufficient documentation

## 2016-08-30 DIAGNOSIS — J4540 Moderate persistent asthma, uncomplicated: Secondary | ICD-10-CM

## 2016-08-30 DIAGNOSIS — Z9989 Dependence on other enabling machines and devices: Secondary | ICD-10-CM | POA: Diagnosis not present

## 2016-08-30 DIAGNOSIS — J302 Other seasonal allergic rhinitis: Secondary | ICD-10-CM

## 2016-08-30 DIAGNOSIS — G4733 Obstructive sleep apnea (adult) (pediatric): Secondary | ICD-10-CM | POA: Diagnosis not present

## 2016-08-30 DIAGNOSIS — K219 Gastro-esophageal reflux disease without esophagitis: Secondary | ICD-10-CM | POA: Diagnosis not present

## 2016-08-30 DIAGNOSIS — Z8673 Personal history of transient ischemic attack (TIA), and cerebral infarction without residual deficits: Secondary | ICD-10-CM | POA: Insufficient documentation

## 2016-08-30 LAB — CBC WITH DIFFERENTIAL/PLATELET
Basophils Absolute: 0.1 10*3/uL (ref 0.0–0.1)
Basophils Relative: 1 % (ref 0.0–3.0)
Eosinophils Absolute: 0.3 10*3/uL (ref 0.0–0.7)
Eosinophils Relative: 3.5 % (ref 0.0–5.0)
HCT: 36.3 % (ref 36.0–46.0)
Hemoglobin: 12.1 g/dL (ref 12.0–15.0)
Lymphocytes Relative: 37 % (ref 12.0–46.0)
Lymphs Abs: 3 10*3/uL (ref 0.7–4.0)
MCHC: 33.2 g/dL (ref 30.0–36.0)
MCV: 89 fl (ref 78.0–100.0)
Monocytes Absolute: 0.8 10*3/uL (ref 0.1–1.0)
Monocytes Relative: 9.9 % (ref 3.0–12.0)
Neutro Abs: 4 10*3/uL (ref 1.4–7.7)
Neutrophils Relative %: 48.6 % (ref 43.0–77.0)
Platelets: 360 10*3/uL (ref 150.0–400.0)
RBC: 4.08 Mil/uL (ref 3.87–5.11)
RDW: 16.8 % — ABNORMAL HIGH (ref 11.5–15.5)
WBC: 8.2 10*3/uL (ref 4.0–10.5)

## 2016-08-30 MED ORDER — AEROCHAMBER MV MISC: 0 refills | Status: AC

## 2016-08-30 MED ORDER — MONTELUKAST SODIUM 10 MG PO TABS: 10.0000 mg | ORAL_TABLET | Freq: Every day | ORAL | 3 refills | Status: DC

## 2016-08-30 NOTE — Progress Notes (Signed)
Subjective:    Patient ID: Tamara Daugherty, female    DOB: 1950/09/22, 66 y.o.   MRN: [id number]  HPI She had no breathing problems as a child or young adulthood. She reports she has since had bronchitis that usually happens at least once a year. She reports she has never had any breathing tests in the past. She reports since November she has had intermittent coughing and wheezing along with shortness of breath. Prior to November 2017 she had intermittent wheezing but this was less frequent or coughing. She rarely used her rescue inhaler prior to November 2017. She reports her dyspnea is on exertion. She reports she has awoken at night with dyspnea. This has occurred 3-4 times monthly. She has been diagnosed with sleep apnea and uses her CPAP with reasonable regularity. She reports she does have some sinus congestion in the Spring but otherwise no pressure or drainage. She does feel her breathing is worse in the Spring as well. She reports dyspnea with exposure to smoke or bleach fumes. She does have chest tightness, pressure and discomfort but only with her dyspnea. She reports very rare reflux on medication. Minimal morning brash water taste. She reports her Symbicort dose was increased from 80/4.5 to 160/4.5 in January without significant help. No dysphagia or odynophagia. No Raynaud's. She does have swelling in her ankles if she stands for long periods.   Review of Systems No rashes or abnormal bruising. No fever, chills, or sweats. A pertinent 14 point review of systems is negative except as per the history of presenting illness.  Allergies  Allergen Reactions  . Bee Venom Anaphylaxis  . Diphenhydramine Hcl Other (See Comments)    Paradoxical reaction/ becomes hyper for days  . Penicillins Hives    No reaction to Ancef.  OK to give.    Marland Kitchen Percocet [Oxycodone-Acetaminophen] Other (See Comments)    hallucination  . Propoxyphene   . Sulfa Antibiotics Itching    Current Outpatient Prescriptions  on File Prior to Visit  Medication Sig Dispense Refill  . albuterol (PROVENTIL HFA;VENTOLIN HFA) 108 (90 BASE) MCG/ACT inhaler Inhale 2 puffs into the lungs every 6 (six) hours as needed for wheezing or shortness of breath.    Marland Kitchen atorvastatin (LIPITOR) 10 MG tablet Take 10 mg by mouth daily.    . baclofen (LIORESAL) 10 MG tablet Take 1 tablet (10 mg total) by mouth 3 (three) times daily. As needed for muscle spasm 50 tablet 0  . budesonide-formoterol (SYMBICORT) 80-4.5 MCG/ACT inhaler Inhale 2 puffs into the lungs 2 (two) times daily.    Marland Kitchen CALCIUM-VITAMIN D PO Take 1 tablet by mouth daily.    Marland Kitchen diltiazem (MATZIM LA) 240 MG 24 hr tablet Take 240 mg by mouth daily.    Marland Kitchen EPINEPHrine 0.3 mg/0.3 mL IJ SOAJ injection Inject 0.3 mg into the muscle once.    . fluticasone (FLONASE) 50 MCG/ACT nasal spray Place 1 spray into both nostrils 2 (two) times daily as needed for allergies or rhinitis.    Marland Kitchen guaiFENesin (MUCINEX) 600 MG 12 hr tablet Take 600 mg by mouth 2 (two) times daily as needed for to loosen phlegm.    Marland Kitchen guaiFENesin-codeine (ROBITUSSIN AC) 100-10 MG/5ML syrup Take 10 mLs by mouth 4 (four) times daily as needed for cough.    . levothyroxine (SYNTHROID, LEVOTHROID) 50 MCG tablet Take 50 mcg by mouth daily before breakfast.    . Multiple Vitamins-Minerals (MULTIVITAMIN WITH MINERALS) tablet Take 1 tablet by mouth daily.    Marland Kitchen  olmesartan-hydrochlorothiazide (BENICAR HCT) 40-25 MG per tablet Take 1 tablet by mouth daily.    . Omega-3 Fatty Acids (FISH OIL PO) Take 1 capsule by mouth 2 (two) times daily.    . pantoprazole (PROTONIX) 40 MG tablet Take 40 mg by mouth daily.    . potassium chloride (MICRO-K) 10 MEQ CR capsule Take 30 mEq by mouth 2 (two) times daily.    Marland Kitchen pyridOXINE (B-6) 50 MG tablet Take 50 mg by mouth daily.    Marland Kitchen torsemide (DEMADEX) 20 MG tablet Take 20 mg by mouth daily.    . diclofenac sodium (VOLTAREN) 1 % GEL Apply 2 g topically 2 (two) times daily. Both knees and lower back    .  HYDROcodone-acetaminophen (NORCO) 10-325 MG per tablet Take 1-2 tablets by mouth every 6 (six) hours as needed. (Patient not taking: Reported on 08/30/2016) 50 tablet 0  . hydrocortisone 2.5 % cream Apply 1 application topically 2 (two) times daily as needed (rash).    . hydrOXYzine (ATARAX/VISTARIL) 25 MG tablet Take 25 mg by mouth every 8 (eight) hours as needed for itching.    . Iron-Vitamin C 65-125 MG TABS Take 1 tablet by mouth 2 (two) times daily.    Marland Kitchen loratadine (CLARITIN) 10 MG tablet Take 10 mg by mouth daily as needed for allergies.    Marland Kitchen meclizine (ANTIVERT) 25 MG tablet Take 25 mg by mouth 4 (four) times daily as needed for dizziness.    . nystatin cream (MYCOSTATIN) Apply 1 application topically 2 (two) times daily as needed (ffected area).    . ondansetron (ZOFRAN) 4 MG tablet Take 1 tablet (4 mg total) by mouth every 8 (eight) hours as needed for nausea or vomiting. (Patient not taking: Reported on 08/30/2016) 30 tablet 0  . rivaroxaban (XARELTO) 10 MG TABS tablet Take 1 tablet (10 mg total) by mouth daily. (Patient not taking: Reported on 08/30/2016) 21 tablet 0  . sennosides-docusate sodium (SENOKOT-S) 8.6-50 MG tablet Take 2 tablets by mouth daily. (Patient not taking: Reported on 08/30/2016) 30 tablet 1  . simvastatin (ZOCOR) 20 MG tablet Take 20 mg by mouth daily.     No current facility-administered medications on file prior to visit.     Past Medical History:  Diagnosis Date  . Anemia    takes Iron daily  . Anxiety   . Arthritis   . Asthma    Symbicort daily and Albuterol daily as needed  . Bruises easily    d/t being on Plavix   . Complication of anesthesia    long time to wake up - referred to pulmonary after carpel tunnel procedure - for asthma and undiagnosed sleep apnea  . GERD (gastroesophageal reflux disease)    takes Protonix daily  . Headache   . Heart murmur    [facility] [place]  . History of blood transfusion    no abnormal reaction noted  .  History of colon polyps    beningn  . History of migraine    couple of wks ago was the last one  . Hyperlipidemia    takes Atorvastatin daily  . Hypertension    takes Benicar and Diltiazem daily  . Hypothyroidism    takes Synthroid daily  . Itching    takes Atarax daily as needed  . Joint pain   . Joint swelling   . Multiple allergies    takes Claritin daily as needed as well as using Flonase if needed  . Muscle spasm  takes Baclofen if needed  . Peripheral edema    takes Torsemide daily  . Pneumonia    hx of  . Primary localized osteoarthritis of left knee 06/21/2014  . Primary localized osteoarthritis of right knee 03/21/2015  . Sleep apnea    cpap  . TIA (transient ischemic attack)    takes Plavix daily;notices right side is slightly weaker than left  . Vertigo    takes Meclizine daily as needed    Past Surgical History:  Procedure Laterality Date  . ABDOMINAL HYSTERECTOMY    . BACK SURGERY    . BREAST SURGERY     cyst removal  . CARDIAC CATHETERIZATION    . carpel tunnel Right   . COLONOSCOPY    . ESOPHAGOGASTRODUODENOSCOPY    . KNEE ARTHROSCOPY    . TONSILLECTOMY    . TOTAL KNEE ARTHROPLASTY Left 06/21/2014   Procedure: LEFT TOTAL KNEE ARTHROPLASTY;  Surgeon: Johnny Bridge, MD;  Location: [facility];  Service: Orthopedics;  Laterality: Left;  . TOTAL KNEE ARTHROPLASTY Right 03/21/2015   Procedure: TOTAL KNEE ARTHROPLASTY STEROID INECTION BOTH KNEES;  Surgeon: Marchia Bond, MD;  Location: [facility];  Service: Orthopedics;  Laterality: Right;  . trigger thumb      Family History  Problem Relation Age of Onset  . Scleroderma Mother   . Rheum arthritis Mother   . Lung cancer Father   . Stroke Brother   . Cancer Maternal Aunt   . Heart disease Paternal Aunt   . Lung cancer Paternal Uncle   . Cancer Cousin   . Lung disease Neg Hx     Social History   Social History  . Marital status: Married    Spouse name: N/A  . Number of children: N/A  . Years of  education: N/A   Social History Main Topics  . Smoking status: Passive Smoke Exposure - Never Smoker    Types: Cigarettes  . Smokeless tobacco: Never Used     Comment: Father smoked briefly.   . Alcohol use Yes     Comment: social  . Drug use: No  . Sexual activity: Not Asked   Other Topics Concern  . None   Social History Narrative   [facility] (08/30/16):   Originally from [place]. 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 No pets currently. No bird, mold, or hot tub exposure. Does have indoor plants. No draperies. Does have carpet in the bedroom. No down bedding that she is aware of.       Objective:   Physical Exam BP 122/74 (BP Location: Left Arm, Patient Position: Sitting, Cuff Size: Large)   Pulse 72   Ht 5\' 5"  (1.651 m)   Wt 296 lb 9.6 oz (134.5 kg)   SpO2 92%   BMI 49.36 kg/m  General:  Awake. Alert. No acute distress. Obese.  Integument:  Warm & dry. No rash on exposed skin. No bruising. Extremities:  No cyanosis or clubbing.  Lymphatics:  No appreciated cervical or supraclavicular lymphadenoapthy. HEENT:  Moist mucus membranes. No oral ulcers. No scleral injection or icterus. Moderate bilateral nasal turbinate swelling. Cardiovascular:  Regular rate and rhythm. No edema. No appreciable JVD.  Pulmonary:  Good aeration & clear to auscultation bilaterally. Symmetric chest wall expansion. No accessory muscle use. Abdomen: Soft. Normal bowel sounds. Nondistended. Grossly nontender. Musculoskeletal:  Normal bulk and tone. Hand grip strength 5/5 bilaterally. No  joint deformity or effusion appreciated. Neurological:  CN 2-12 grossly in tact. No meningismus. Moving all 4 extremities equally. Symmetric brachioradialis deep tendon reflexes. Psychiatric:  Mood and affect congruent. Speech normal rhythm, rate & tone.     Assessment & Plan:  66 y.o. female previously diagnosed  with asthma. Patient has symptoms of chronic seasonal allergic rhinitis that seems to be reasonably well controlled as well as underlying reflux. I do question whether or not her technique with her inhaler is sufficient to allow for adequate drug delivery. With her family history of scleroderma and autoimmune diseases I feel that at least pulmonary function testing and chest x-ray imaging is reasonable. She has no signs of volume overload that would suggest cor pulmonale or symptoms that would suggest pulmonary hypertension. I instructed the patient contact my office if she had any new problems with her medication or questions before her next appointment.  1. Plan: Moderate, persistent asthma: Checking full pulmonary function testing as well as 6 minute walk test on room air before next appointment. Checking chest x-ray PA/LAT given cough. Patient given spacer to use with her Symbicort inhaler.  2. Chronic seasonal allergic rhinitis: Checking serum CBC with differential & RAST panel. Starting Singulair 10 mg by mouth daily at bedtime.  3. GERD: Continuing Protonix. No changes. 4. OSA: Continuing CPAP therapy indefinitely. Excellent adherence. 5. Follow-up: Patient to return to clinic in 4 weeks or sooner if needed.  Sonia Baller Ashok Cordia, M.D. [facility] Pager:  [phone] After 3pm or if no response, call [phone] 1:55 PM 08/30/16

## 2016-08-30 NOTE — Patient Instructions (Addendum)
   Continue using your Symbicort inhaler.  Remember to use the spacer with your Symbicort.   Call me if you have any new problems with your Singulair medication.  TESTS ORDERED: 1. CXR PA/LAT TODAY 2. SERUM CBC W/ Differential & RAST Panel 3. Full PFTs before next appointment 4. 6MWT before next appointment

## 2016-08-30 NOTE — Progress Notes (Signed)
Patient seen in the office today and instructed on use of spacer. Patient expressed understanding and demonstrated technique. 

## 2016-09-02 LAB — RESPIRATORY ALLERGY PROFILE REGION II ~~LOC~~
Allergen, A. alternata, m6: <0.1 kU/L
Allergen, C. Herbarum, M2: <0.1 kU/L
Allergen, Cedar tree, t12: <0.1 kU/L
Allergen, Comm Silver Birch, t9: <0.1 kU/L
Allergen, Cottonwood, t14: <0.1 kU/L
Allergen, D pternoyssinus,d7: 0.18 kU/L — ABNORMAL HIGH
Allergen, Mouse Urine Protein, e78: <0.1 kU/L
Allergen, Mulberry, t76: <0.1 kU/L
Allergen, Oak,t7: <0.1 kU/L
Allergen, P. notatum, m1: 0.14 kU/L — ABNORMAL HIGH
Aspergillus fumigatus, m3: <0.1 kU/L
Bermuda Grass: <0.1 kU/L
Box Elder IgE: <0.1 kU/L
Cat Dander: <0.1 kU/L
Cockroach: <0.1 kU/L
Common Ragweed: <0.1 kU/L
D. farinae: 0.16 kU/L — ABNORMAL HIGH
Dog Dander: <0.1 kU/L
Elm IgE: <0.1 kU/L
IgE (Immunoglobulin E), Serum: 121 kU/L — ABNORMAL HIGH (ref <115)
Johnson Grass: <0.1 kU/L
Pecan/Hickory Tree IgE: <0.1 kU/L
Rough Pigweed  IgE: <0.1 kU/L
Sheep Sorrel IgE: <0.1 kU/L
Timothy Grass: <0.1 kU/L

## 2016-09-06 NOTE — Progress Notes (Signed)
Called patient and left voicemail for patient to contact office for medical results.

## 2016-09-09 ENCOUNTER — Telehealth: Payer: Self-pay | Admitting: Pulmonary Disease

## 2016-09-09 NOTE — Telephone Encounter (Signed)
Notes recorded by Javier Glazier, MD on 09/05/2016 at 5:05 PM EDT Please let the patient know I reviewed her chest x-ray and there is nothing abnormal that I can see. Thanks. ----------------------------------------- Spoke with pt. She is aware of results. Nothing further was needed.

## 2016-09-27 ENCOUNTER — Ambulatory Visit (INDEPENDENT_AMBULATORY_CARE_PROVIDER_SITE_OTHER): Payer: BLUE CROSS/BLUE SHIELD | Admitting: Pulmonary Disease

## 2016-09-27 DIAGNOSIS — R0602 Shortness of breath: Secondary | ICD-10-CM

## 2016-09-27 DIAGNOSIS — J4540 Moderate persistent asthma, uncomplicated: Secondary | ICD-10-CM

## 2016-09-27 LAB — PULMONARY FUNCTION TEST
DL/VA % pred: 93 %
DL/VA: 4.72 ml/min/mmHg/L
DLCO cor % pred: 66 %
DLCO cor: 17.98 ml/min/mmHg
DLCO unc % pred: 69 %
DLCO unc: 18.76 ml/min/mmHg
FEF 25-75 Post: 1.37 L/sec
FEF 25-75 Pre: 1.37 L/sec
FEF2575-%Change-Post: 0 %
FEF2575-%Pred-Post: 66 %
FEF2575-%Pred-Pre: 66 %
FEV1-%Change-Post: 3 %
FEV1-%Pred-Post: 71 %
FEV1-%Pred-Pre: 69 %
FEV1-Post: 1.56 L
FEV1-Pre: 1.51 L
FEV1FVC-%Change-Post: 3 %
FEV1FVC-%Pred-Pre: 99 %
FEV6-%Change-Post: 0 %
FEV6-%Pred-Post: 71 %
FEV6-%Pred-Pre: 71 %
FEV6-Post: 1.92 L
FEV6-Pre: 1.92 L
FEV6FVC-%Pred-Post: 103 %
FEV6FVC-%Pred-Pre: 103 %
FVC-%Change-Post: 0 %
FVC-%Pred-Post: 69 %
FVC-%Pred-Pre: 69 %
FVC-Post: 1.92 L
FVC-Pre: 1.92 L
Post FEV1/FVC ratio: 81 %
Post FEV6/FVC ratio: 100 %
Pre FEV1/FVC ratio: 79 %
Pre FEV6/FVC Ratio: 100 %

## 2016-09-27 NOTE — Progress Notes (Signed)
PFT done today. Shakeerah Gradel,CMA  

## 2016-10-02 ENCOUNTER — Ambulatory Visit (INDEPENDENT_AMBULATORY_CARE_PROVIDER_SITE_OTHER): Payer: BLUE CROSS/BLUE SHIELD | Admitting: Pulmonary Disease

## 2016-10-02 ENCOUNTER — Encounter: Payer: Self-pay | Admitting: Pulmonary Disease

## 2016-10-02 VITALS — BP 120/68 | HR 63 | Ht 65.0 in | Wt 293.2 lb

## 2016-10-02 DIAGNOSIS — G4733 Obstructive sleep apnea (adult) (pediatric): Secondary | ICD-10-CM

## 2016-10-02 DIAGNOSIS — J4540 Moderate persistent asthma, uncomplicated: Secondary | ICD-10-CM

## 2016-10-02 DIAGNOSIS — J302 Other seasonal allergic rhinitis: Secondary | ICD-10-CM | POA: Diagnosis not present

## 2016-10-02 DIAGNOSIS — J984 Other disorders of lung: Secondary | ICD-10-CM | POA: Diagnosis not present

## 2016-10-02 DIAGNOSIS — K219 Gastro-esophageal reflux disease without esophagitis: Secondary | ICD-10-CM

## 2016-10-02 DIAGNOSIS — Z9989 Dependence on other enabling machines and devices: Secondary | ICD-10-CM

## 2016-10-02 NOTE — Progress Notes (Signed)
Subjective:    Patient ID: Tamara Daugherty, female    DOB: 1951-01-27, 66 y.o.   MRN: [id number]  C.C.:  Follow-up for Moderate, Persistent Asthma, Chronic Seasonal Allergic Rhinitis, GERD, & OSA.   HPI Moderate, Persistent Asthma: Started on Singulair last appointment. Given spacer to use with Symbicort inhaler last appointment. She reports her coughing has improved recently. No nocturnal awakenings with any coughing or wheezing. Wheezing has significantly improved. Coughing more first thing in the morning. She reports infrequent need for her rescue inhaler.   Chronic Seasonal Allergic Rhinitis: Started on Singulair last appointment. She reports minimal sinus congestion & drainage.   GERD: Currently on Protonix. No reflux or dyspepsia. No morning brash water taste.   OSA: Currently on CPAP therapy with excellent adherence. No complaints with her machine or quality of sleep.   Review of Systems No chest pain, tightness, or pressure. No fever or chills. No rashes or bruising.   Allergies  Allergen Reactions  . Bee Venom Anaphylaxis  . Diphenhydramine Hcl Other (See Comments)    Paradoxical reaction/ becomes hyper for days  . Penicillins Hives    No reaction to Ancef.  OK to give.    Marland Kitchen Percocet [Oxycodone-Acetaminophen] Other (See Comments)    hallucination  . Propoxyphene   . Sulfa Antibiotics Itching    Current Outpatient Prescriptions on File Prior to Visit  Medication Sig Dispense Refill  . albuterol (PROVENTIL HFA;VENTOLIN HFA) 108 (90 BASE) MCG/ACT inhaler Inhale 2 puffs into the lungs every 6 (six) hours as needed for wheezing or shortness of breath.    Marland Kitchen atorvastatin (LIPITOR) 10 MG tablet Take 10 mg by mouth daily.    . baclofen (LIORESAL) 10 MG tablet Take 1 tablet (10 mg total) by mouth 3 (three) times daily. As needed for muscle spasm 50 tablet 0  . budesonide-formoterol (SYMBICORT) 80-4.5 MCG/ACT inhaler Inhale 2 puffs into the lungs 2 (two) times daily.    Marland Kitchen  CALCIUM-VITAMIN D PO Take 1 tablet by mouth daily.    . clopidogrel (PLAVIX) 75 MG tablet Take 1 tablet by mouth daily.    Marland Kitchen diltiazem (MATZIM LA) 240 MG 24 hr tablet Take 240 mg by mouth daily.    Marland Kitchen EPINEPHrine 0.3 mg/0.3 mL IJ SOAJ injection Inject 0.3 mg into the muscle once.    . fluticasone (FLONASE) 50 MCG/ACT nasal spray Place 1 spray into both nostrils 2 (two) times daily as needed for allergies or rhinitis.    Marland Kitchen guaiFENesin (MUCINEX) 600 MG 12 hr tablet Take 600 mg by mouth 2 (two) times daily as needed for to loosen phlegm.    Marland Kitchen guaiFENesin-codeine (ROBITUSSIN AC) 100-10 MG/5ML syrup Take 10 mLs by mouth 4 (four) times daily as needed for cough.    . hydrOXYzine (ATARAX/VISTARIL) 25 MG tablet Take 25 mg by mouth every 8 (eight) hours as needed for itching.    . Iron-Vitamin C 65-125 MG TABS Take 1 tablet by mouth 2 (two) times daily.    Marland Kitchen levothyroxine (SYNTHROID, LEVOTHROID) 50 MCG tablet Take 50 mcg by mouth daily before breakfast.    . loratadine (CLARITIN) 10 MG tablet Take 10 mg by mouth daily as needed for allergies.    . montelukast (SINGULAIR) 10 MG tablet Take 1 tablet (10 mg total) by mouth at bedtime. 30 tablet 3  . Multiple Vitamins-Minerals (MULTIVITAMIN WITH MINERALS) tablet Take 1 tablet by mouth daily.    Marland Kitchen olmesartan-hydrochlorothiazide (BENICAR HCT) 40-25 MG per tablet Take 1 tablet by  mouth daily.    . Omega-3 Fatty Acids (FISH OIL PO) Take 1 capsule by mouth 2 (two) times daily.    . pantoprazole (PROTONIX) 40 MG tablet Take 40 mg by mouth daily.    . potassium chloride (MICRO-K) 10 MEQ CR capsule Take 30 mEq by mouth 2 (two) times daily.    Marland Kitchen pyridOXINE (B-6) 50 MG tablet Take 50 mg by mouth daily.    Marland Kitchen Spacer/Aero-Holding Chambers (AEROCHAMBER MV) inhaler Use as instructed 1 each 0  . torsemide (DEMADEX) 20 MG tablet Take 20 mg by mouth daily.    . diclofenac sodium (VOLTAREN) 1 % GEL Apply 2 g topically 2 (two) times daily. Both knees and lower back    .  HYDROcodone-acetaminophen (NORCO) 10-325 MG per tablet Take 1-2 tablets by mouth every 6 (six) hours as needed. (Patient not taking: Reported on 10/02/2016) 50 tablet 0  . hydrocortisone 2.5 % cream Apply 1 application topically 2 (two) times daily as needed (rash).    . meclizine (ANTIVERT) 25 MG tablet Take 25 mg by mouth 4 (four) times daily as needed for dizziness.    . nystatin cream (MYCOSTATIN) Apply 1 application topically 2 (two) times daily as needed (ffected area).    . ondansetron (ZOFRAN) 4 MG tablet Take 1 tablet (4 mg total) by mouth every 8 (eight) hours as needed for nausea or vomiting. (Patient not taking: Reported on 10/02/2016) 30 tablet 0  . rivaroxaban (XARELTO) 10 MG TABS tablet Take 1 tablet (10 mg total) by mouth daily. (Patient not taking: Reported on 10/02/2016) 21 tablet 0  . sennosides-docusate sodium (SENOKOT-S) 8.6-50 MG tablet Take 2 tablets by mouth daily. (Patient not taking: Reported on 10/02/2016) 30 tablet 1  . simvastatin (ZOCOR) 20 MG tablet Take 20 mg by mouth daily.     No current facility-administered medications on file prior to visit.     Past Medical History:  Diagnosis Date  . Anemia    takes Iron daily  . Anxiety   . Arthritis   . Asthma    Symbicort daily and Albuterol daily as needed  . Bruises easily    d/t being on Plavix   . Complication of anesthesia    long time to wake up - referred to pulmonary after carpel tunnel procedure - for asthma and undiagnosed sleep apnea  . GERD (gastroesophageal reflux disease)    takes Protonix daily  . Headache   . Heart murmur    [facility] [place] [facility]  . History of blood transfusion    no abnormal reaction noted  . History of colon polyps    [facility]  . History of migraine    couple of wks ago was the last one  . Hyperlipidemia    takes Atorvastatin daily  . Hypertension    takes Benicar and Diltiazem daily  . Hypothyroidism    takes Synthroid daily  . Itching    takes Atarax daily as  needed  . Joint pain   . Joint swelling   . Multiple allergies    takes Claritin daily as needed as well as using Flonase if needed  . Muscle spasm    takes Baclofen if needed  . Peripheral edema    takes Torsemide daily  . Pneumonia    hx of  . Primary localized osteoarthritis of left knee 06/21/2014  . Primary localized osteoarthritis of right knee 03/21/2015  . Sleep apnea    cpap  . TIA (transient ischemic attack)  takes Plavix daily;notices right side is slightly weaker than left  . Vertigo    takes Meclizine daily as needed    Past Surgical History:  Procedure Laterality Date  . ABDOMINAL HYSTERECTOMY    . BACK SURGERY    . BREAST SURGERY     cyst removal  . CARDIAC CATHETERIZATION    . carpel tunnel Right   . COLONOSCOPY    . ESOPHAGOGASTRODUODENOSCOPY    . KNEE ARTHROSCOPY    . TONSILLECTOMY    . TOTAL KNEE ARTHROPLASTY Left 06/21/2014   Procedure: LEFT TOTAL KNEE ARTHROPLASTY;  Surgeon: Johnny Bridge, MD;  Location: [facility];  Service: Orthopedics;  Laterality: Left;  . TOTAL KNEE ARTHROPLASTY Right 03/21/2015   Procedure: TOTAL KNEE ARTHROPLASTY STEROID INECTION BOTH KNEES;  Surgeon: Marchia Bond, MD;  Location: [facility];  Service: Orthopedics;  Laterality: Right;  . trigger thumb      Family History  Problem Relation Age of Onset  . Scleroderma Mother   . Rheum arthritis Mother   . Lung cancer Father   . Stroke Brother   . Cancer Maternal Aunt   . Heart disease Paternal Aunt   . Lung cancer Paternal Uncle   . Cancer Cousin   . Lung disease Neg Hx     Social History   Social History  . Marital status: Married    Spouse name: N/A  . Number of children: N/A  . Years of education: N/A   Social History Main Topics  . Smoking status: Passive Smoke Exposure - Never Smoker    Types: Cigarettes  . Smokeless tobacco: Never Used     Comment: Father smoked briefly.   . Alcohol use Yes     Comment: social  . Drug use: No  . Sexual activity: Not Asked    Other Topics Concern  . None   Social History Narrative   [facility] (08/30/16):   Originally from [place]. 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 No pets currently. No bird, mold, or hot tub exposure. Does have indoor plants. No draperies. Does have carpet in the bedroom. No down bedding that she is aware of.       Objective:   Physical Exam BP 120/68 (BP Location: Right Arm, Patient Position: Sitting, Cuff Size: Large)   Pulse 63   Ht 5\' 5"  (1.651 m)   Wt 293 lb 3.2 oz (133 kg)   SpO2 95%   BMI 48.79 kg/m   Gen.: Obese. Accompanied by husband today. No distress. Integument: No rash or bruising on exposed skin. Warm and dry. HEENT: Mild bilateral nasal turbinate swelling. No oral ulcers. Moist mucous membranes. Cardiovascular: Regular rate. Regular rhythm. No edema or JVD appreciated. Pulmonary: There with auscultation bilaterally. Good aeration bilaterally. No accessory muscle use on room air. Abdomen: Soft. Protuberant. Normal bowel sounds.   PFT 09/27/16: FVC 1.92 L (69%) FEV1 1.51 L (69%) FEV1/FVC 0.79 FEF 25-75 1.37 L (66%) negative bronchodilator response TLC 3.46 L (64%) RV 71% ERV 22% DLCO corrected 66%  6MWT 09/27/16:  Walked 318 meters / Baseline Sat 100% on RA / Nadir Sat 93% on RA (rested for 1 min)  IMAGING CXR PA/LAT 08/30/16 (personally reviewed by me):  Borderline mild cardiomegaly. Low lung volumes. No focal opacity or effusion appreciated. Mediastinum normal in contour.  LABS  08/30/16 CBC: 8.2/12.1/36.3/360 Eosinophils: 0.3 IgE: 121 RAST panel: Marginal/week positives  Assessment & Plan:  66 y.o. female with moderate, persistent asthma, chronic seasonal allergic rhinitis, GERD, & OSA.Patient's pulmonary function testing does not indicate airway obstruction but does suggest it based on the FEV1 as noted above. The ratio is not decreased with the  restriction seen as moderate on her lung volumes. Her carbon monoxide diffusion capacity is mildly decreased which is of unclear significance but certainly of no given her significant desaturation on her 6 minute walk test. Even so, the patient did not reach the level where she would require oxygen therapy I again reviewed her chest x-ray and there is no obvious parenchymal cause for these findings of restriction and significant desaturation. Certainly some element of atelectasis from the patient's weight could be contributing. She does indicate that she has had a hiatal hernia in the past which could certainly be contributing but is not evident on x-ray imaging. Overall her reflux seems to be well-controlled. I instructed the patient to contact my office if she had any new breathing problems or questions before her next appointment.  1. Moderate, persistent asthma:  Improved control on Symbicort 160/4.5 and Singulair. Repeat spirometry with bronchodilator challenge at next appointment and consider de-escalating inhaled corticosteroid therapy. 2. Restrictive lung disease: Holding off on CT imaging. Repeat spirometry with bronchodilator challenge & DLCO next appointment. Also repeating 6 minute walk test on room air at next appointment.  3. Chronic seasonal allergic rhinitis:  Controlled with current regimen. Continuing Singulair. 4. GERD: Asymptomatic on Protonix. No changes at this time. 5. OSA: Continuing CPAP therapy indefinitely. 6. Follow-up: Return to clinic in 3 months or sooner if needed.  Sonia Baller Ashok Cordia, M.D. [facility] Pager:  [phone] After 3pm or if no response, call [phone] 4:51 PM 10/02/16

## 2016-10-02 NOTE — Progress Notes (Signed)
6MWT 09/27/16:  Walked 318 meters / Baseline Sat 100% on RA / Nadir Sat 93% on RA (rested for 1 min)

## 2016-10-02 NOTE — Patient Instructions (Signed)
   Call me if you have any new breathing problems or questions before your next appointment.  We are going to leave all your medications the same.  We will repeat a breathing test at your next appointment.  TESTS ORDERED: 1. Spirometry with bronchodilator challenge & DLCO at next appointment 2. 6MWT on room air at next appointment

## 2016-10-03 MED ORDER — MONTELUKAST SODIUM 10 MG PO TABS: 10.0000 mg | ORAL_TABLET | Freq: Every day | ORAL | 3 refills | Status: DC

## 2016-10-03 NOTE — Addendum Note (Signed)
Addended by: Tyson Dense on: 10/03/2016 09:35 AM   Modules accepted: Orders

## 2017-01-03 ENCOUNTER — Ambulatory Visit (INDEPENDENT_AMBULATORY_CARE_PROVIDER_SITE_OTHER): Payer: BLUE CROSS/BLUE SHIELD | Admitting: *Deleted

## 2017-01-03 DIAGNOSIS — J4540 Moderate persistent asthma, uncomplicated: Secondary | ICD-10-CM | POA: Diagnosis not present

## 2017-01-03 NOTE — Progress Notes (Signed)
SIX MIN WALK 01/03/2017 01/03/2017 09/27/2016  Medications - Lipitor 10mg , symbicort 160,plavix 75, vit c 65-125,synthroid 50,singulair 10, benicar 40-25,protonix 40,B-6 50 around 10-1030am Symbicort 80 and Synthroid 34mcg @ 630am //  Calcium-VitD, Plavix 75mg , Iron, MVit, Benicar HCT, Omega 3, Protonix 40mg , VitB6 50mg , and Zocor 20mg  --ALL at 10am  Supplimental Oxygen during Test? (L/min) Yes No No  O2 Flow Rate 2 - -  Type Continuous Continuous -  Laps 2 6 6   Partial Lap (in Meters) 39 23 30  Baseline BP (sitting) - 130/80 104/58  Baseline Heartrate - 81 70  Baseline Dyspnea (Borg Scale) - 0.5 1  Baseline Fatigue (Borg Scale) - 0 1  Baseline SPO2 - 98 99  BP (sitting) 136/88 - 162/90  Heartrate 118 - 121  Dyspnea (Borg Scale) 3 - 3  Fatigue (Borg Scale) 3 - 3  SPO2 95 - 94  BP (sitting) 128/70 - 144/82  Heartrate 82 - 87  SPO2 100 - 100  Stopped or Paused before Six Minutes No Yes Yes  Other Symptoms at end of Exercise - - patient paused from 2:08 - 1:10, sat down to rest. Sats checked while resting and were WNL - 124HR and 93% O2  Interpretation - - Dizziness  Distance Completed 135 311 318  Tech Comments: Pt was on 2L after destating to 87%.  TOTAL METERS: 311 + 135=446  Pt stopped with 10 sec.s left due to oxygen desating at 87% HR 149. Pt walked a moderate pace, she tolderated the walk well no desat during walk test, pt did stop and rest for about 1 minute, sats were checked during this time and were good, 124HR and 93% O2, no desat at end of test. --amg

## 2017-01-08 ENCOUNTER — Other Ambulatory Visit: Payer: Self-pay | Admitting: Pulmonary Disease

## 2017-01-08 DIAGNOSIS — R0600 Dyspnea, unspecified: Secondary | ICD-10-CM

## 2017-01-09 ENCOUNTER — Ambulatory Visit (INDEPENDENT_AMBULATORY_CARE_PROVIDER_SITE_OTHER): Payer: BLUE CROSS/BLUE SHIELD | Admitting: Pulmonary Disease

## 2017-01-09 ENCOUNTER — Encounter: Payer: Self-pay | Admitting: Pulmonary Disease

## 2017-01-09 ENCOUNTER — Ambulatory Visit: Payer: BLUE CROSS/BLUE SHIELD

## 2017-01-09 VITALS — BP 122/76 | HR 76 | Ht 66.0 in | Wt 292.0 lb

## 2017-01-09 DIAGNOSIS — R011 Cardiac murmur, unspecified: Secondary | ICD-10-CM | POA: Diagnosis not present

## 2017-01-09 DIAGNOSIS — J4540 Moderate persistent asthma, uncomplicated: Secondary | ICD-10-CM | POA: Diagnosis not present

## 2017-01-09 DIAGNOSIS — J984 Other disorders of lung: Secondary | ICD-10-CM

## 2017-01-09 DIAGNOSIS — J309 Allergic rhinitis, unspecified: Secondary | ICD-10-CM

## 2017-01-09 DIAGNOSIS — J9611 Chronic respiratory failure with hypoxia: Secondary | ICD-10-CM | POA: Insufficient documentation

## 2017-01-09 DIAGNOSIS — R0600 Dyspnea, unspecified: Secondary | ICD-10-CM | POA: Diagnosis not present

## 2017-01-09 LAB — PULMONARY FUNCTION TEST
DL/VA % pred: 96 %
DL/VA: 4.85 ml/min/mmHg/L
DLCO cor % pred: 62 %
DLCO cor: 16.83 ml/min/mmHg
DLCO unc % pred: 65 %
DLCO unc: 17.65 ml/min/mmHg
FEF 25-75 Post: 0.87 L/sec
FEF 25-75 Pre: 1.25 L/sec
FEF2575-%Change-Post: -30 %
FEF2575-%Pred-Post: 42 %
FEF2575-%Pred-Pre: 60 %
FEV1-%Change-Post: -7 %
FEV1-%Pred-Post: 71 %
FEV1-%Pred-Pre: 76 %
FEV1-Post: 1.54 L
FEV1-Pre: 1.67 L
FEV1FVC-%Change-Post: 5 %
FEV1FVC-%Pred-Pre: 94 %
FEV6-%Change-Post: -12 %
FEV6-%Pred-Post: 72 %
FEV6-%Pred-Pre: 83 %
FEV6-Post: 1.95 L
FEV6-Pre: 2.24 L
FEV6FVC-%Change-Post: 0 %
FEV6FVC-%Pred-Post: 103 %
FEV6FVC-%Pred-Pre: 104 %
FVC-%Change-Post: -12 %
FVC-%Pred-Post: 70 %
FVC-%Pred-Pre: 80 %
FVC-Post: 1.96 L
FVC-Pre: 2.24 L
Post FEV1/FVC ratio: 79 %
Post FEV6/FVC ratio: 99 %
Pre FEV1/FVC ratio: 75 %
Pre FEV6/FVC Ratio: 100 %

## 2017-01-09 MED ORDER — MONTELUKAST SODIUM 10 MG PO TABS: 10.0000 mg | ORAL_TABLET | Freq: Every day | ORAL | 3 refills | Status: DC

## 2017-01-09 NOTE — Progress Notes (Signed)
PFT done today. 

## 2017-01-09 NOTE — Patient Instructions (Addendum)
   Continue using your inhalers and medications as prescribed.  Try to remember to use your Flonase daily to help with your sinus allergies.  We will review your testing at your next appointment.  Call me if you have any new breathing problems or questions before your next appointment.   TESTS ORDERED: 1. HRCT Chest Prone & Supine imaging 2. 6MWT on room air at next appointment  3. Complete Echocardiogram

## 2017-01-09 NOTE — Progress Notes (Signed)
Subjective:    Patient ID: Tamara Daugherty, female    DOB: [birth year], 66 y.o.   MRN: [id number]  C.C.:  Follow-up for Moderate, Persistent Asthma, Chronic Seasonal Allergic Rhinitis, GERD, & OSA.   HPI Moderate, persistent asthma: Patient prescribed Symbicort 160/4.5 & Singulair. She feels like her dyspnea is improved. She does notice with increased heat & humidity she has more problems. She does cough intermittently, mostly in the morning. No wheezing. She reports she has used her rescue inhaler 3-4 times in the last 4 weeks. No exacerbations since last appointment.   Chronic seasonal allergic rhinitis: Prescribed Singulair. Improved sinus congestion & drainage. She does have sinus drainage in the mornings still. She is using her Flonase intermittently.   GERD: Prescribed Protonix. No reflux or dyspepsia. No morning brash water taste. No dysphagia or odynophagia.  OSA: On CPAP therapy. Consistent CPAP use. Good sleep quality.   Review of Systems No chest pain or pressure. No fever or chills. No abdominal pain or nausea. No Raynaud's. Patient does report some transient joint stiffness in her hands in the morning but no swelling and the stiffness resolves after minutes with application of moist heat.  Allergies  Allergen Reactions  . Bee Venom Anaphylaxis  . Diphenhydramine Hcl Other (See Comments)    Paradoxical reaction/ becomes hyper for days  . Penicillins Hives    No reaction to Ancef.  OK to give.    Marland Kitchen Percocet [Oxycodone-Acetaminophen] Other (See Comments)    hallucination  . Propoxyphene   . Sulfa Antibiotics Itching    Current Outpatient Prescriptions on File Prior to Visit  Medication Sig Dispense Refill  . albuterol (PROVENTIL HFA;VENTOLIN HFA) 108 (90 BASE) MCG/ACT inhaler Inhale 2 puffs into the lungs every 6 (six) hours as needed for wheezing or shortness of breath.    Marland Kitchen atorvastatin (LIPITOR) 10 MG tablet Take 10 mg by mouth daily.    . budesonide-formoterol  (SYMBICORT) 160-4.5 MCG/ACT inhaler Inhale 2 puffs into the lungs 2 (two) times daily.    Marland Kitchen CALCIUM-VITAMIN D PO Take 1 tablet by mouth daily.    . clopidogrel (PLAVIX) 75 MG tablet Take 1 tablet by mouth daily.    Marland Kitchen diltiazem (MATZIM LA) 240 MG 24 hr tablet Take 240 mg by mouth daily.    Marland Kitchen EPINEPHrine 0.3 mg/0.3 mL IJ SOAJ injection Inject 0.3 mg into the muscle once.    . fluticasone (FLONASE) 50 MCG/ACT nasal spray Place 1 spray into both nostrils 2 (two) times daily as needed for allergies or rhinitis.    Marland Kitchen guaiFENesin-codeine (ROBITUSSIN AC) 100-10 MG/5ML syrup Take 10 mLs by mouth 4 (four) times daily as needed for cough.    . hydrocortisone 2.5 % cream Apply 1 application topically 2 (two) times daily as needed (rash).    Marland Kitchen levothyroxine (SYNTHROID, LEVOTHROID) 50 MCG tablet Take 50 mcg by mouth daily before breakfast.    . loratadine (CLARITIN) 10 MG tablet Take 10 mg by mouth daily as needed for allergies.    Marland Kitchen meclizine (ANTIVERT) 25 MG tablet Take 25 mg by mouth 4 (four) times daily as needed for dizziness.    . montelukast (SINGULAIR) 10 MG tablet Take 1 tablet (10 mg total) by mouth at bedtime. 90 tablet 3  . Multiple Vitamins-Minerals (MULTIVITAMIN WITH MINERALS) tablet Take 1 tablet by mouth daily.    Marland Kitchen olmesartan-hydrochlorothiazide (BENICAR HCT) 40-25 MG per tablet Take 1 tablet by mouth daily.    . Omega-3 Fatty Acids (FISH OIL PO)  Take 1 capsule by mouth 2 (two) times daily.    . pantoprazole (PROTONIX) 40 MG tablet Take 40 mg by mouth daily.    . potassium chloride (MICRO-K) 10 MEQ CR capsule Take 30 mEq by mouth 2 (two) times daily.    Marland Kitchen pyridOXINE (B-6) 50 MG tablet Take 50 mg by mouth daily.    . sennosides-docusate sodium (SENOKOT-S) 8.6-50 MG tablet Take 2 tablets by mouth daily. 30 tablet 1  . simvastatin (ZOCOR) 20 MG tablet Take 20 mg by mouth daily.    Marland Kitchen Spacer/Aero-Holding Chambers (AEROCHAMBER MV) inhaler Use as instructed 1 each 0  . torsemide (DEMADEX) 20 MG  tablet Take 20 mg by mouth daily.    . baclofen (LIORESAL) 10 MG tablet Take 1 tablet (10 mg total) by mouth 3 (three) times daily. As needed for muscle spasm (Patient not taking: Reported on 01/09/2017) 50 tablet 0  . diclofenac sodium (VOLTAREN) 1 % GEL Apply 2 g topically 2 (two) times daily. Both knees and lower back    . guaiFENesin (MUCINEX) 600 MG 12 hr tablet Take 600 mg by mouth 2 (two) times daily as needed for to loosen phlegm.    Marland Kitchen HYDROcodone-acetaminophen (NORCO) 10-325 MG per tablet Take 1-2 tablets by mouth every 6 (six) hours as needed. (Patient not taking: Reported on 01/09/2017) 50 tablet 0  . hydrOXYzine (ATARAX/VISTARIL) 25 MG tablet Take 25 mg by mouth every 8 (eight) hours as needed for itching.    . Iron-Vitamin C 65-125 MG TABS Take 1 tablet by mouth 2 (two) times daily.    Marland Kitchen nystatin cream (MYCOSTATIN) Apply 1 application topically 2 (two) times daily as needed (ffected area).    . ondansetron (ZOFRAN) 4 MG tablet Take 1 tablet (4 mg total) by mouth every 8 (eight) hours as needed for nausea or vomiting. (Patient not taking: Reported on 01/09/2017) 30 tablet 0  . rivaroxaban (XARELTO) 10 MG TABS tablet Take 1 tablet (10 mg total) by mouth daily. (Patient not taking: Reported on 01/09/2017) 21 tablet 0   No current facility-administered medications on file prior to visit.     Past Medical History:  Diagnosis Date  . Anemia    takes Iron daily  . Anxiety   . Arthritis   . Asthma    Symbicort daily and Albuterol daily as needed  . Bruises easily    d/t being on Plavix   . Complication of anesthesia    long time to wake up - referred to pulmonary after carpel tunnel procedure - for asthma and undiagnosed sleep apnea  . GERD (gastroesophageal reflux disease)    takes Protonix daily  . Headache   . Heart murmur    [facility] [place]  . History of blood transfusion    no abnormal reaction noted  . History of colon polyps    [facility]  . History of migraine     couple of wks ago was the last one  . Hyperlipidemia    takes Atorvastatin daily  . Hypertension    takes Benicar and Diltiazem daily  . Hypothyroidism    takes Synthroid daily  . Itching    takes Atarax daily as needed  . Joint pain   . Joint swelling   . Multiple allergies    takes Claritin daily as needed as well as using Flonase if needed  . Muscle spasm    takes Baclofen if needed  . Peripheral edema    takes Torsemide daily  .  Pneumonia    hx of  . Primary localized osteoarthritis of left knee 06/21/2014  . Primary localized osteoarthritis of right knee 03/21/2015  . Sleep apnea    cpap  . TIA (transient ischemic attack)    takes Plavix daily;notices right side is slightly weaker than left  . Vertigo    takes Meclizine daily as needed    Past Surgical History:  Procedure Laterality Date  . ABDOMINAL HYSTERECTOMY    . BACK SURGERY    . BREAST SURGERY     cyst removal  . CARDIAC CATHETERIZATION    . carpel tunnel Right   . COLONOSCOPY    . ESOPHAGOGASTRODUODENOSCOPY    . KNEE ARTHROSCOPY    . TONSILLECTOMY    . TOTAL KNEE ARTHROPLASTY Left 06/21/2014   Procedure: LEFT TOTAL KNEE ARTHROPLASTY;  Surgeon: Johnny Bridge, MD;  Location: [facility];  Service: Orthopedics;  Laterality: Left;  . TOTAL KNEE ARTHROPLASTY Right 03/21/2015   Procedure: TOTAL KNEE ARTHROPLASTY STEROID INECTION BOTH KNEES;  Surgeon: Marchia Bond, MD;  Location: [facility];  Service: Orthopedics;  Laterality: Right;  . trigger thumb      Family History  Problem Relation Age of Onset  . Scleroderma Mother   . Rheum arthritis Mother   . Lung cancer Father   . Stroke Brother   . Cancer Maternal Aunt   . Heart disease Paternal Aunt   . Lung cancer Paternal Uncle   . Cancer Cousin   . Lung disease Neg Hx     Social History   Social History  . Marital status: Married    Spouse name: N/A  . Number of children: N/A  . Years of education: N/A   Social History Main Topics  . Smoking status:  Passive Smoke Exposure - Never Smoker    Types: Cigarettes  . Smokeless tobacco: Never Used     Comment: Father smoked briefly.   . Alcohol use Yes     Comment: social  . Drug use: No  . Sexual activity: Not Asked   Other Topics Concern  . None   Social History Narrative   [facility] (08/30/16):   Originally from [place]. 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 No pets currently. No bird, mold, or hot tub exposure. Does have indoor plants. No draperies. Does have carpet in the bedroom. No down bedding that she is aware of.       Objective:   Physical Exam BP 122/76 (BP Location: Right Arm, Patient Position: Sitting, Cuff Size: Large)   Pulse 76   Ht 5\' 6"  (1.676 m)   Wt 292 lb (132.5 kg)   SpO2 93%   BMI 47.13 kg/m   General:  Awake. Alert. No acute distress. Obese. Integument:  Warm & dry. No rash on exposed skin.  Extremities:  No cyanosis or clubbing.  HEENT:  Moist mucus membranes. Moderate bilateral nasal turbinate swelling with pale mucosa. No scleral icterus. Cardiovascular:  Regular rate. No edema. 3/6 systolic ejection murmur appreciated.  Pulmonary:  Clear to auscultation. Normal work of breathing on room air. Abdomen: Soft. Normal bowel sounds. Protuberant. Musculoskeletal:  Normal bulk and tone. Hand grip strength 5/5 bilaterally. No joint deformity or effusion appreciated. Cyst noted at right first MCP joint. Minimal swelling of left MCP joints.  PFT 01/09/17: FVC 2.24 L (80%) FEV1 1.67 L (76%) FEV1/FVC 0.75 FEF 25-75 1.25 L (60%)  negative bronchodilator response                                                                DLCO corrected 62% 09/27/16: FVC 1.92 L (69%) FEV1 1.51 L (69%) FEV1/FVC 0.79 FEF 25-75 1.37 L (66%) negative bronchodilator response TLC 3.46 L (64%) RV 71% ERV 22% DLCO corrected 66%  6MWT 01/03/17:  Walked 446 meters / Baseline Sat 98% on RA  / Nadir Sat 87% on RA @ 5:50 (required 2 L/m with exertion to maintain) 09/27/16:  Walked 318 meters / Baseline Sat 100% on RA / Nadir Sat 93% on RA (rested for 1 min)  IMAGING CXR PA/LAT 08/30/16 (previously reviewed by me):  Borderline mild cardiomegaly. Low lung volumes. No focal opacity or effusion appreciated. Mediastinum normal in contour.  LABS  08/30/16 CBC: 8.2/12.1/36.3/360 Eosinophils: 0.3 IgE: 121 RAST panel: Marginal/week positives    Assessment & Plan:  66 y.o. female with moderate, persistent asthma that seems to be clinically improving on her current inhaler and medication regimen. Her spirometry is continuing to improve by review and comparison today. Despite this, she did have a desaturation at the end of her walk test today. I'm concerned that with her family history of scleroderma in her mother and restrictive lung disease on lung volumes evaluation with high-resolution CT imaging is necessary to rule out any underlying interstitial lung disease. She also has a heart murmur on physical exam today that warrants evaluation with an echocardiogram. I instructed the patient contact me if she had any new breathing problems or questions before her next appointment.   1. Moderate, persistent asthma:  Continuing Singulair and Symbicort. Consider de-escalation of inhaled corticosteroid at next appointment if some control persists. 2. Hypoxia: Present with exertion.  Mild and toward the end of her walk. Repeat 6 minute walk test on room air at next appointment. 3. Restrictive lung disease:  Checking high-resolution CT chest without contrast with both prone and supine imaging. 4. Chronic seasonal allergic rhinitis: Continuing Singulair. Recommended daily use of Flonase. 5. GERD: Controlled with Protonix. No changes at this time. 6. OSA: Continuing CPAP therapy indefinitely. 7. Heart murmur: Checking complete echocardiogram. 8. Maintenance: Plan to address at next  appointment. 9. Follow-up: Return to clinic in 3 months or sooner if needed.  Sonia Baller Ashok Cordia, M.D. [facility] Pager:  [phone] After 3pm or if no response, call [phone] 10:23 AM 01/09/17

## 2017-01-09 NOTE — Addendum Note (Signed)
Addended by: Tyson Dense on: 01/09/2017 11:05 AM   Modules accepted: Orders

## 2017-01-16 ENCOUNTER — Ambulatory Visit (INDEPENDENT_AMBULATORY_CARE_PROVIDER_SITE_OTHER)
Admission: RE | Admit: 2017-01-16 | Discharge: 2017-01-16 | Disposition: A | Discharge status: Home / Self Care | Payer: BLUE CROSS/BLUE SHIELD | Source: Ambulatory Visit | Attending: Pulmonary Disease | Admitting: Pulmonary Disease

## 2017-01-16 ENCOUNTER — Other Ambulatory Visit: Payer: Self-pay

## 2017-01-16 ENCOUNTER — Ambulatory Visit (HOSPITAL_COMMUNITY): Payer: BLUE CROSS/BLUE SHIELD | Attending: Cardiology

## 2017-01-16 DIAGNOSIS — R011 Cardiac murmur, unspecified: Secondary | ICD-10-CM | POA: Diagnosis not present

## 2017-01-16 DIAGNOSIS — J984 Other disorders of lung: Secondary | ICD-10-CM | POA: Diagnosis not present

## 2017-01-16 DIAGNOSIS — E785 Hyperlipidemia, unspecified: Secondary | ICD-10-CM | POA: Diagnosis not present

## 2017-01-16 DIAGNOSIS — I081 Rheumatic disorders of both mitral and tricuspid valves: Secondary | ICD-10-CM | POA: Insufficient documentation

## 2017-01-16 DIAGNOSIS — I10 Essential (primary) hypertension: Secondary | ICD-10-CM | POA: Diagnosis not present

## 2017-01-17 ENCOUNTER — Other Ambulatory Visit: Payer: Self-pay | Admitting: Pulmonary Disease

## 2017-01-17 DIAGNOSIS — I517 Cardiomegaly: Secondary | ICD-10-CM

## 2017-01-17 NOTE — Progress Notes (Signed)
Spoke with patient and informed her of results and cardio referral. She was made aware someone will contact her to get referral scheduled. She verbalized understanding and did not have any questions. Order placed for referral. Nothing further is needed.

## 2017-01-30 ENCOUNTER — Encounter: Payer: Self-pay | Admitting: Cardiovascular Disease

## 2017-01-30 ENCOUNTER — Ambulatory Visit (INDEPENDENT_AMBULATORY_CARE_PROVIDER_SITE_OTHER): Payer: BLUE CROSS/BLUE SHIELD | Admitting: Cardiovascular Disease

## 2017-01-30 VITALS — BP 107/70 | HR 66 | Ht 65.0 in | Wt 292.0 lb

## 2017-01-30 DIAGNOSIS — I10 Essential (primary) hypertension: Secondary | ICD-10-CM | POA: Diagnosis not present

## 2017-01-30 DIAGNOSIS — I517 Cardiomegaly: Secondary | ICD-10-CM

## 2017-01-30 DIAGNOSIS — Z8673 Personal history of transient ischemic attack (TIA), and cerebral infarction without residual deficits: Secondary | ICD-10-CM

## 2017-01-30 NOTE — Patient Instructions (Signed)
Medication Instructions:  Continue all current medications.  Labwork: none  Testing/Procedures: Your physician has requested that you have an echocardiogram. Echocardiography is a painless test that uses sound waves to create images of your heart. It provides your doctor with information about the size and shape of your heart and how well your heart's chambers and valves are working. This procedure takes approximately one hour. There are no restrictions for this procedure. (DUE IN 1 YEAR)  Follow-Up: Your physician wants you to follow up in:  1 year.  You will receive a reminder letter in the mail one-two months in advance.  If you don't receive a letter, please call our office to schedule the follow up appointment   Any Other Special Instructions Will Be Listed Below (If Applicable).  If you need a refill on your cardiac medications before your next appointment, please call your pharmacy.

## 2017-01-30 NOTE — Progress Notes (Signed)
CARDIOLOGY CONSULT NOTE  Patient ID: Tamara Daugherty MRN: [id number] DOB/AGE: June 11, 1951 66 y.o.  Admit date: (Not on file) Primary Physician: Eber Hong, MD Referring Physician: Tera Partridge  Reason for Consultation: LV dilatation  HPI: Tamara Daugherty is a 66 y.o. female who is being seen today for the evaluation of left ventricular dilation at the request of Tera Partridge MD.  She has a history of asthma, GERD, TIA, hypothyroidism, hypertension, sleep apnea, and obesity.  Echocardiogram 01/16/17 demonstrated moderate left ventricular dilatation with normal left ventricular systolic function and regional wall motion, LVEF 55-60%. There was mild mitral regurgitation. LV ID diameter was 55 mm (43-52 normal range).  High-resolution chest CT 01/16/17 showed pulmonic trunk dilatation, 4.1 cm suggestive of pulmonary arterial hypertension. There was mild cardiomegaly.  She denies chest pain. She has chronic stable exertional dyspnea. Shortness of breath is provoked when she is outside when it is very hot and humid.  She does not smoke. She occasionally has palpitations when drinking coffee.  She said she had a cardiac catheterization 20 years ago which was normal.  Family history: Brother died of MI at age 62. He had untreated hypertension, was a smoker, and had a stroke. Mother died of scleroderma. Father died of lung cancer at age 40.   Allergies  Allergen Reactions  . Bee Venom Anaphylaxis  . Diphenhydramine Hcl Other (See Comments)    Paradoxical reaction/ becomes hyper for days  . Penicillins Hives    No reaction to Ancef.  OK to give.    Marland Kitchen Percocet [Oxycodone-Acetaminophen] Other (See Comments)    hallucination  . Propoxyphene   . Sulfa Antibiotics Itching    Current Outpatient Prescriptions  Medication Sig Dispense Refill  . albuterol (PROVENTIL HFA;VENTOLIN HFA) 108 (90 BASE) MCG/ACT inhaler Inhale 2 puffs into the lungs every 6 (six) hours as needed for  wheezing or shortness of breath.    . budesonide-formoterol (SYMBICORT) 160-4.5 MCG/ACT inhaler Inhale 2 puffs into the lungs 2 (two) times daily.    Marland Kitchen CALCIUM-VITAMIN D PO Take 1 tablet by mouth daily.    . clopidogrel (PLAVIX) 75 MG tablet Take 1 tablet by mouth daily.    Marland Kitchen diltiazem (MATZIM LA) 240 MG 24 hr tablet Take 240 mg by mouth daily.    Marland Kitchen EPINEPHrine 0.3 mg/0.3 mL IJ SOAJ injection Inject 0.3 mg into the muscle once.    . fluticasone (FLONASE) 50 MCG/ACT nasal spray Place 1 spray into both nostrils 2 (two) times daily as needed for allergies or rhinitis.    Marland Kitchen guaiFENesin (MUCINEX) 600 MG 12 hr tablet Take 600 mg by mouth 2 (two) times daily as needed for to loosen phlegm.    Marland Kitchen guaiFENesin-codeine (ROBITUSSIN AC) 100-10 MG/5ML syrup Take 10 mLs by mouth 4 (four) times daily as needed for cough.    . hydrOXYzine (ATARAX/VISTARIL) 25 MG tablet Take 25 mg by mouth every 8 (eight) hours as needed for itching.    . levothyroxine (SYNTHROID, LEVOTHROID) 50 MCG tablet Take 50 mcg by mouth daily before breakfast.    . loratadine (CLARITIN) 10 MG tablet Take 10 mg by mouth daily as needed for allergies.    Marland Kitchen meclizine (ANTIVERT) 25 MG tablet Take 25 mg by mouth 4 (four) times daily as needed for dizziness.    . montelukast (SINGULAIR) 10 MG tablet Take 1 tablet (10 mg total) by mouth at bedtime. 90 tablet 3  . Multiple Vitamins-Minerals (MULTIVITAMIN WITH MINERALS) tablet Take  1 tablet by mouth daily.    Marland Kitchen olmesartan-hydrochlorothiazide (BENICAR HCT) 40-25 MG per tablet Take 1 tablet by mouth daily.    . Omega-3 Fatty Acids (FISH OIL PO) Take 1 capsule by mouth 2 (two) times daily.    . pantoprazole (PROTONIX) 40 MG tablet Take 40 mg by mouth daily.    . potassium chloride (MICRO-K) 10 MEQ CR capsule Take 30 mEq by mouth 2 (two) times daily.    Marland Kitchen pyridOXINE (B-6) 50 MG tablet Take 50 mg by mouth daily.    . simvastatin (ZOCOR) 20 MG tablet Take 20 mg by mouth daily.    Marland Kitchen Spacer/Aero-Holding  Chambers (AEROCHAMBER MV) inhaler Use as instructed 1 each 0  . torsemide (DEMADEX) 20 MG tablet Take 20 mg by mouth as needed.      No current facility-administered medications for this visit.     Past Medical History:  Diagnosis Date  . Anemia    takes Iron daily  . Anxiety   . Arthritis   . Asthma    Symbicort daily and Albuterol daily as needed  . Bruises easily    d/t being on Plavix   . Complication of anesthesia    long time to wake up - referred to pulmonary after carpel tunnel procedure - for asthma and undiagnosed sleep apnea  . GERD (gastroesophageal reflux disease)    takes Protonix daily  . Headache   . Heart murmur    [facility] [place]  . History of blood transfusion    no abnormal reaction noted  . History of colon polyps    beningn  . History of migraine    couple of wks ago was the last one  . Hyperlipidemia    takes Atorvastatin daily  . Hypertension    takes Benicar and Diltiazem daily  . Hypothyroidism    takes Synthroid daily  . Itching    takes Atarax daily as needed  . Joint pain   . Joint swelling   . Multiple allergies    takes Claritin daily as needed as well as using Flonase if needed  . Muscle spasm    takes Baclofen if needed  . Peripheral edema    takes Torsemide daily  . Pneumonia    hx of  . Primary localized osteoarthritis of left knee 06/21/2014  . Primary localized osteoarthritis of right knee 03/21/2015  . Sleep apnea    cpap  . TIA (transient ischemic attack)    takes Plavix daily;notices right side is slightly weaker than left  . Vertigo    takes Meclizine daily as needed    Past Surgical History:  Procedure Laterality Date  . ABDOMINAL HYSTERECTOMY    . BACK SURGERY    . BREAST SURGERY     cyst removal  . CARDIAC CATHETERIZATION    . carpel tunnel Right   . COLONOSCOPY    . ESOPHAGOGASTRODUODENOSCOPY    . KNEE ARTHROSCOPY    . TONSILLECTOMY    . TOTAL KNEE ARTHROPLASTY Left 06/21/2014   Procedure:  LEFT TOTAL KNEE ARTHROPLASTY;  Surgeon: Johnny Bridge, MD;  Location: [facility];  Service: Orthopedics;  Laterality: Left;  . TOTAL KNEE ARTHROPLASTY Right 03/21/2015   Procedure: TOTAL KNEE ARTHROPLASTY STEROID INECTION BOTH KNEES;  Surgeon: Marchia Bond, MD;  Location: [facility];  Service: Orthopedics;  Laterality: Right;  . trigger thumb      Social History   Social History  . Marital status: Married    Spouse  name: N/A  . Number of children: N/A  . Years of education: N/A   Occupational History  . Not on file.   Social History Main Topics  . Smoking status: Passive Smoke Exposure - Never Smoker    Types: Cigarettes  . Smokeless tobacco: Never Used     Comment: Father smoked briefly.   . Alcohol use Yes     Comment: social  . Drug use: No  . Sexual activity: Not on file   Other Topics Concern  . Not on file   Social History Narrative   [facility] (08/30/16):   Originally from [place]. 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 No pets currently. No bird, mold, or hot tub exposure. Does have indoor plants. No draperies. Does have carpet in the bedroom. No down bedding that she is aware of.      No family history of premature CAD in 1st degree relatives.  Current Meds  Medication Sig  . albuterol (PROVENTIL HFA;VENTOLIN HFA) 108 (90 BASE) MCG/ACT inhaler Inhale 2 puffs into the lungs every 6 (six) hours as needed for wheezing or shortness of breath.  . budesonide-formoterol (SYMBICORT) 160-4.5 MCG/ACT inhaler Inhale 2 puffs into the lungs 2 (two) times daily.  Marland Kitchen CALCIUM-VITAMIN D PO Take 1 tablet by mouth daily.  . clopidogrel (PLAVIX) 75 MG tablet Take 1 tablet by mouth daily.  Marland Kitchen diltiazem (MATZIM LA) 240 MG 24 hr tablet Take 240 mg by mouth daily.  Marland Kitchen EPINEPHrine 0.3 mg/0.3 mL IJ SOAJ injection Inject 0.3 mg into the muscle once.  . fluticasone (FLONASE) 50 MCG/ACT nasal spray  Place 1 spray into both nostrils 2 (two) times daily as needed for allergies or rhinitis.  Marland Kitchen guaiFENesin (MUCINEX) 600 MG 12 hr tablet Take 600 mg by mouth 2 (two) times daily as needed for to loosen phlegm.  Marland Kitchen guaiFENesin-codeine (ROBITUSSIN AC) 100-10 MG/5ML syrup Take 10 mLs by mouth 4 (four) times daily as needed for cough.  . hydrOXYzine (ATARAX/VISTARIL) 25 MG tablet Take 25 mg by mouth every 8 (eight) hours as needed for itching.  . levothyroxine (SYNTHROID, LEVOTHROID) 50 MCG tablet Take 50 mcg by mouth daily before breakfast.  . loratadine (CLARITIN) 10 MG tablet Take 10 mg by mouth daily as needed for allergies.  Marland Kitchen meclizine (ANTIVERT) 25 MG tablet Take 25 mg by mouth 4 (four) times daily as needed for dizziness.  . montelukast (SINGULAIR) 10 MG tablet Take 1 tablet (10 mg total) by mouth at bedtime.  . Multiple Vitamins-Minerals (MULTIVITAMIN WITH MINERALS) tablet Take 1 tablet by mouth daily.  Marland Kitchen olmesartan-hydrochlorothiazide (BENICAR HCT) 40-25 MG per tablet Take 1 tablet by mouth daily.  . Omega-3 Fatty Acids (FISH OIL PO) Take 1 capsule by mouth 2 (two) times daily.  . pantoprazole (PROTONIX) 40 MG tablet Take 40 mg by mouth daily.  . potassium chloride (MICRO-K) 10 MEQ CR capsule Take 30 mEq by mouth 2 (two) times daily.  Marland Kitchen pyridOXINE (B-6) 50 MG tablet Take 50 mg by mouth daily.  . simvastatin (ZOCOR) 20 MG tablet Take 20 mg by mouth daily.  Marland Kitchen Spacer/Aero-Holding Chambers (AEROCHAMBER MV) inhaler Use as instructed  . torsemide (DEMADEX) 20 MG tablet Take 20 mg by mouth as needed.       Review of systems complete and found to be negative unless listed above in HPI    Physical exam Blood pressure 107/70, pulse  66, height 5\' 5"  (1.651 m), weight 292 lb (132.5 kg), SpO2 100 %. General: NAD Neck: No JVD, no thyromegaly or thyroid nodule.  Lungs: Clear to auscultation bilaterally with normal respiratory effort. CV: Nondisplaced PMI. Regular rate and rhythm, normal S1/S2, no  S3/S4, no murmur.  No peripheral edema.  No carotid bruit.    Abdomen: Protuberant.  Skin: Intact without lesions or rashes.  Neurologic: Alert and oriented x 3.  Psych: Normal affect. Extremities: No clubbing or cyanosis.  HEENT: Normal.   ECG: Most recent ECG reviewed.   Labs: Lab Results  Component Value Date/Time   K 4.3 03/22/2015 06:25 AM   BUN 18 03/22/2015 06:25 AM   CREATININE 1.42 (H) 03/22/2015 06:25 AM   HGB 12.1 08/30/2016 01:31 PM     Lipids: No results found for: LDLCALC, LDLDIRECT, CHOL, TRIG, HDL      ASSESSMENT AND PLAN:  1. Left ventricular dilatation: Enlargement is probably mild rather than moderate in severity. It is likely related to sleep apnea, pulmonary disease, and obesity. I will repeat an echocardiogram in 1 year with contrast to assess for interval changes. Blood pressure is controlled.  2. Hypertension: Blood pressure is normal. No changes to therapy.  3. History of TIA: Currently on Plavix and statin.    Disposition: Follow up in 1 year   Signed: Kate Sable, M.D., F.A.C.C.  01/30/2017, 1:56 PM

## 2017-04-14 ENCOUNTER — Ambulatory Visit: Payer: BLUE CROSS/BLUE SHIELD | Admitting: Pulmonary Disease

## 2017-04-14 ENCOUNTER — Ambulatory Visit: Payer: BLUE CROSS/BLUE SHIELD

## 2017-04-21 ENCOUNTER — Ambulatory Visit: Payer: BLUE CROSS/BLUE SHIELD | Admitting: Pulmonary Disease

## 2017-04-21 ENCOUNTER — Ambulatory Visit (INDEPENDENT_AMBULATORY_CARE_PROVIDER_SITE_OTHER): Payer: BLUE CROSS/BLUE SHIELD | Admitting: *Deleted

## 2017-04-21 ENCOUNTER — Ambulatory Visit (INDEPENDENT_AMBULATORY_CARE_PROVIDER_SITE_OTHER): Payer: BLUE CROSS/BLUE SHIELD | Admitting: Pulmonary Disease

## 2017-04-21 VITALS — BP 136/76 | HR 77 | Ht 66.0 in | Wt 284.0 lb

## 2017-04-21 DIAGNOSIS — K219 Gastro-esophageal reflux disease without esophagitis: Secondary | ICD-10-CM

## 2017-04-21 DIAGNOSIS — J4540 Moderate persistent asthma, uncomplicated: Secondary | ICD-10-CM | POA: Diagnosis not present

## 2017-04-21 DIAGNOSIS — J302 Other seasonal allergic rhinitis: Secondary | ICD-10-CM | POA: Diagnosis not present

## 2017-04-21 DIAGNOSIS — J9611 Chronic respiratory failure with hypoxia: Secondary | ICD-10-CM | POA: Diagnosis not present

## 2017-04-21 NOTE — Progress Notes (Signed)
Subjective:    Patient ID: Tamara Daugherty, female    DOB: [birth year], 66 y.o.   MRN: [id number]  C.C.:  Follow-up for Moderate, Persistent Asthma, Restrictive Lung Disease, Chronic Seasonal Allergic Rhinitis, GERD, & OSA.   HPI Moderate, persistent asthma: Prescribed Symbicort 160/4.5 and Singulair. She is adherent to her Symbicort. She rarely uses her rescue inhaler. Dyspnea is at baseline. No recent coughing or wheezing. No exacerbations since last appointment.   Restrictive lung disease: Seen on lung volumes performed in April. Likely secondary to patient's dilated left ventricle and weight. No evidence of interstitial lung disease on high-resolution CT imaging.  Chronic seasonal allergic rhinitis: Prescribed Singulair. Patient previously using Flonase intermittently. She did have some mild sinus congestion & drainage last week that resolved. She was using Robitussin and Vicks Vaporub. She is using her nasal spray in the Flonase.   GERD: Prescribed Protonix. No reflux or dyspepsia. No morning brash water taste.  OSA: Prescribed CPAP therapy. Previously endorsed good sleep quality.  Review of Systems No chest pain or pressure. No fever or chills. No rashes or bruising.   Allergies  Allergen Reactions  . Bee Venom Anaphylaxis  . Diphenhydramine Hcl Other (See Comments)    Paradoxical reaction/ becomes hyper for days  . Penicillins Hives    No reaction to Ancef.  OK to give.    Marland Kitchen Percocet [Oxycodone-Acetaminophen] Other (See Comments)    hallucination  . Propoxyphene   . Sulfa Antibiotics Itching    Current Outpatient Prescriptions on File Prior to Visit  Medication Sig Dispense Refill  . albuterol (PROVENTIL HFA;VENTOLIN HFA) 108 (90 BASE) MCG/ACT inhaler Inhale 2 puffs into the lungs every 6 (six) hours as needed for wheezing or shortness of breath.    . budesonide-formoterol (SYMBICORT) 160-4.5 MCG/ACT inhaler Inhale 2 puffs into the lungs 2 (two) times daily.    Marland Kitchen  CALCIUM-VITAMIN D PO Take 1 tablet by mouth daily.    . clopidogrel (PLAVIX) 75 MG tablet Take 1 tablet by mouth daily.    Marland Kitchen diltiazem (MATZIM LA) 240 MG 24 hr tablet Take 240 mg by mouth daily.    Marland Kitchen EPINEPHrine 0.3 mg/0.3 mL IJ SOAJ injection Inject 0.3 mg into the muscle once.    . fluticasone (FLONASE) 50 MCG/ACT nasal spray Place 1 spray into both nostrils 2 (two) times daily as needed for allergies or rhinitis.    Marland Kitchen guaiFENesin (MUCINEX) 600 MG 12 hr tablet Take 600 mg by mouth 2 (two) times daily as needed for to loosen phlegm.    Marland Kitchen guaiFENesin-codeine (ROBITUSSIN AC) 100-10 MG/5ML syrup Take 10 mLs by mouth 4 (four) times daily as needed for cough.    . hydrOXYzine (ATARAX/VISTARIL) 25 MG tablet Take 25 mg by mouth every 8 (eight) hours as needed for itching.    . levothyroxine (SYNTHROID, LEVOTHROID) 50 MCG tablet Take 50 mcg by mouth daily before breakfast.    . loratadine (CLARITIN) 10 MG tablet Take 10 mg by mouth daily as needed for allergies.    Marland Kitchen meclizine (ANTIVERT) 25 MG tablet Take 25 mg by mouth 4 (four) times daily as needed for dizziness.    . montelukast (SINGULAIR) 10 MG tablet Take 1 tablet (10 mg total) by mouth at bedtime. 90 tablet 3  . Multiple Vitamins-Minerals (MULTIVITAMIN WITH MINERALS) tablet Take 1 tablet by mouth daily.    Marland Kitchen olmesartan-hydrochlorothiazide (BENICAR HCT) 40-25 MG per tablet Take 1 tablet by mouth daily.    . Omega-3 Fatty Acids (FISH  OIL PO) Take 1 capsule by mouth 2 (two) times daily.    . pantoprazole (PROTONIX) 40 MG tablet Take 40 mg by mouth daily.    . potassium chloride (MICRO-K) 10 MEQ CR capsule Take 30 mEq by mouth 2 (two) times daily.    Marland Kitchen pyridOXINE (B-6) 50 MG tablet Take 50 mg by mouth daily.    . simvastatin (ZOCOR) 20 MG tablet Take 20 mg by mouth daily.    Marland Kitchen Spacer/Aero-Holding Chambers (AEROCHAMBER MV) inhaler Use as instructed 1 each 0  . torsemide (DEMADEX) 20 MG tablet Take 20 mg by mouth as needed.      No current  facility-administered medications on file prior to visit.     Past Medical History:  Diagnosis Date  . Anemia    takes Iron daily  . Anxiety   . Arthritis   . Asthma    Symbicort daily and Albuterol daily as needed  . Bruises easily    d/t being on Plavix   . Complication of anesthesia    long time to wake up - referred to pulmonary after carpel tunnel procedure - for asthma and undiagnosed sleep apnea  . GERD (gastroesophageal reflux disease)    takes Protonix daily  . Headache   . Heart murmur    [facility] [place] [facility]  . History of blood transfusion    no abnormal reaction noted  . History of colon polyps    beningn  . History of migraine    couple of wks ago was the last one  . Hyperlipidemia    takes Atorvastatin daily  . Hypertension    takes Benicar and Diltiazem daily  . Hypothyroidism    takes Synthroid daily  . Itching    takes Atarax daily as needed  . Joint pain   . Joint swelling   . Multiple allergies    takes Claritin daily as needed as well as using Flonase if needed  . Muscle spasm    takes Baclofen if needed  . Peripheral edema    takes Torsemide daily  . Pneumonia    hx of  . Primary localized osteoarthritis of left knee 06/21/2014  . Primary localized osteoarthritis of right knee 03/21/2015  . Sleep apnea    cpap  . TIA (transient ischemic attack)    takes Plavix daily;notices right side is slightly weaker than left  . Vertigo    takes Meclizine daily as needed    Past Surgical History:  Procedure Laterality Date  . ABDOMINAL HYSTERECTOMY    . BACK SURGERY    . BREAST SURGERY     cyst removal  . CARDIAC CATHETERIZATION    . carpel tunnel Right   . COLONOSCOPY    . ESOPHAGOGASTRODUODENOSCOPY    . KNEE ARTHROSCOPY    . TONSILLECTOMY    . TOTAL KNEE ARTHROPLASTY Left 06/21/2014   Procedure: LEFT TOTAL KNEE ARTHROPLASTY;  Surgeon: Johnny Bridge, MD;  Location: [facility];  Service: Orthopedics;  Laterality: Left;  . TOTAL KNEE  ARTHROPLASTY Right 03/21/2015   Procedure: TOTAL KNEE ARTHROPLASTY STEROID INECTION BOTH KNEES;  Surgeon: Marchia Bond, MD;  Location: [facility];  Service: Orthopedics;  Laterality: Right;  . trigger thumb      Family History  Problem Relation Age of Onset  . Scleroderma Mother   . Rheum arthritis Mother   . Lung cancer Father   . Stroke Brother   . Cancer Maternal Aunt   . Heart disease Paternal Aunt   .  Lung cancer Paternal Uncle   . Cancer Cousin   . Lung disease Neg Hx     Social History   Social History  . Marital status: Married    Spouse name: N/A  . Number of children: N/A  . Years of education: N/A   Social History Main Topics  . Smoking status: Passive Smoke Exposure - Never Smoker    Types: Cigarettes  . Smokeless tobacco: Never Used     Comment: Father smoked briefly.   . Alcohol use Yes     Comment: social  . Drug use: No  . Sexual activity: Not on file   Other Topics Concern  . Not on file   Social History Narrative   [facility] (08/30/16):   Originally from [place]. 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 No pets currently. No bird, mold, or hot tub exposure. Does have indoor plants. No draperies. Does have carpet in the bedroom. No down bedding that she is aware of.       Objective:   Physical Exam BP 136/76 (BP Location: Left Arm, Cuff Size: Normal)   Pulse 77   Ht 5\' 6"  (1.676 m)   Wt 284 lb (128.8 kg)   SpO2 96%   BMI 45.84 kg/m   General:  Awake. Obese female. No distress. Integument:  Warm. Dry. No rash. Extremities:  No cyanosis or clubbing.  HEENT: Mild bilateral nasal turbinate swelling. No scleral icterus. Moist mucous membranes.  Cardiovascular: Regular rate. Unable to appreciate JVD. Normal S1 & S2.  Pulmonary:  Normal work of breathing on room air. Clear with auscultation bilaterally. Abdomen: Soft. Normal bowel sounds.  Protuberant. Musculoskeletal:  Normal bulk and tone. No joint deformity or effusion appreciated. Neurological:  Cranial nerves 2-12 grossly in tact. No meningismus. Moving all 4 extremities equally.   PFT 01/09/17: FVC 2.24 L (80%) FEV1 1.67 L (76%) FEV1/FVC 0.75 FEF 25-75 1.25 L (60%) negative bronchodilator response                                                                DLCO corrected 62% 09/27/16: FVC 1.92 L (69%) FEV1 1.51 L (69%) FEV1/FVC 0.79 FEF 25-75 1.37 L (66%) negative bronchodilator response TLC 3.46 L (64%) RV 71% ERV 22% DLCO corrected 66%  6MWT 04/21/17:  Walked 318 meters / Baseline Sat 100% on RA / Nadir Sat 95% on RA 01/03/17:  Walked 446 meters / Baseline Sat 98% on RA / Nadir Sat 87% on RA @ 5:50 (required 2 L/m with exertion to maintain) 09/27/16:  Walked 318 meters / Baseline Sat 100% on RA / Nadir Sat 93% on RA (rested for 1 min)  IMAGING HRCT CHEST W/O 01/16/17 (personally reviewed by me):  Mild dilation of the pulmonic trunk measuring 4.1 cm. Mild tracheobronchomalacia. Cardiomegaly noted. No bronchiectasis. No reticulation, intralobular septal thickening, or medical changes to suggest interstitial lung disease. No parenchymal mass or opacification. No pleural effusion or thickening. No pericardial effusion. No pathologic mediastinal adenopathy.   CXR PA/LAT 08/30/16 (previously reviewed by me):  Borderline mild cardiomegaly. Low lung volumes. No focal opacity or effusion appreciated. Mediastinum normal in contour.  CARDIAC TTE (01/16/17):  LV  moderately dilated with EF 55-60%. No regional wall motion abnormalities. LA & RA normal in size. RV normal in size and function. No aortic stenosis or regurgitation. Aortic root normal in size. Mild mitral regurgitation without stenosis. Trivial pulmonic regurgitation without stenosis. Mild tricuspid regurgitation. No pericardial effusion.  LABS  08/30/16 CBC: 8.2/12.1/36.3/360 Eosinophils: 0.3 IgE: 121 RAST panel:  Marginal/week positives    Assessment & Plan:  66 y.o. female with moderate, persistent asthma. Symptomatically this seems to be well-controlled on Singulair and Symbicort 160/4.5. Previously was on 80/4.5 which was then increased in December 2017 prior to her appointment with me in March. Overall her allergic rhinitis and reflux are well controlled at this time. I reviewed her walk test today which showed no significant hypoxia but does show a borderline significant desaturation. This is of unclear clinical significance. We reviewed her echocardiogram which does show some LV dilatation. Patient reports she was evaluated by cardiology and this was thought to be clinically insignificant. I instructed the patient to contact my office if she had any new breathing problems or questions before her next appointment.   1. Moderate, persistent asthma:  Attempting to de-escalate inhaled corticosteroid therapy to Symbicort 80/4.5. Continuing Singulair. She will contact me for a prescription for new Symbicort inhaler if this is as effective. 2. Chronic seasonal allergic rhinitis:  Continuing Flonase and singular. No changes. 3. GERD: Controlled with Protonix. No changes. 4. Health maintenance:  Reports Pneumonia Vaccine some time ago. Refuses Flu Vaccine. Plan on Prevnar Vaccine at next appointment.  5. Follow-up: Return to clinic in 6 months or sooner if needed.  Sonia Baller Ashok Cordia, M.D. [facility] Pager:  [phone] After 3pm or if no response, call [phone] 4:05 PM 04/21/17

## 2017-04-21 NOTE — Patient Instructions (Addendum)
   Continue using your Albuterol inhaler as needed.  Try using the Symbicort 80/4.5 in place of your 160/4.5 inhaler. Do 2 puffs twice daily. If you feel this is as effective and you don't have any worsening in your breathing or new symptoms call and we will send in a prescription.  You can go and get the Prevnar 13 Vaccine before your next appointment if it is more convenient.   We will see you back in 6 months or sooner if needed.

## 2017-04-21 NOTE — Progress Notes (Signed)
SIX MIN WALK 04/21/2017 01/03/2017 01/03/2017 09/27/2016  Medications cardizem, Flonase, multivitamin, protonix 9:30am - Lipitor 10mg , symbicort 160,plavix 75, vit c 65-125,synthroid 50,singulair 10, benicar 40-25,protonix 40,B-6 50 around 10-1030am Symbicort 80 and Synthroid 60mcg @ 630am //  Calcium-VitD, Plavix 75mg , Iron, MVit, Benicar HCT, Omega 3, Protonix 40mg , VitB6 50mg , and Zocor 20mg  --ALL at 10am  Supplimental Oxygen during Test? (L/min) No Yes No No  O2 Flow Rate - 2 - -  Type - Continuous Continuous -  Laps 6 2 6 6   Partial Lap (in Meters) 30 39 23 30  Baseline BP (sitting) 144/70 - 130/80 104/58  Baseline Heartrate 69 - 81 70  Baseline Dyspnea (Borg Scale) 3 - 0.5 1  Baseline Fatigue (Borg Scale) 3 - 0 1  Baseline SPO2 100 - 98 99  BP (sitting) 168/62 136/88 - 162/90  Heartrate 138 118 - 121  Dyspnea (Borg Scale) 4 3 - 3  Fatigue (Borg Scale) 3 3 - 3  SPO2 95 95 - 94  BP (sitting) 150/62 128/70 - 144/82  Heartrate 92 82 - 87  SPO2 100 100 - 100  Stopped or Paused before Six Minutes No No Yes Yes  Other Symptoms at end of Exercise - - - patient paused from 2:08 - 1:10, sat down to rest. Sats checked while resting and were WNL - 124HR and 93% O2  Interpretation Hip pain - - Dizziness  Distance Completed 318 135 311 318  Tech Comments: Pt reported hip pain, steady walk TA/CMA Pt was on 2L after destating to 87%.  TOTAL METERS: 311 + 135=446  Pt stopped with 10 sec.s left due to oxygen desating at 87% HR 149. Pt walked a moderate pace, she tolderated the walk well no desat during walk test, pt did stop and rest for about 1 minute, sats were checked during this time and were good, 124HR and 93% O2, no desat at end of test. --amg

## 2017-08-29 ENCOUNTER — Telehealth: Payer: Self-pay | Admitting: Adult Health

## 2017-08-29 MED ORDER — MONTELUKAST SODIUM 10 MG PO TABS: 10.0000 mg | ORAL_TABLET | Freq: Every day | ORAL | 3 refills | Status: DC

## 2017-08-29 NOTE — Telephone Encounter (Signed)
Attempted to call pt but unable to reach her.  Left detailed message on pt's phone stating that I was sending a refill of singulair to her preferred pharmacy for her.  Nothing further needed at this current time.

## 2017-10-29 ENCOUNTER — Encounter: Payer: Self-pay | Admitting: Pulmonary Disease

## 2017-10-29 ENCOUNTER — Ambulatory Visit: Payer: BLUE CROSS/BLUE SHIELD | Admitting: Pulmonary Disease

## 2017-10-29 VITALS — BP 130/80 | HR 94 | Ht 66.0 in | Wt 303.0 lb

## 2017-10-29 DIAGNOSIS — Z9989 Dependence on other enabling machines and devices: Secondary | ICD-10-CM | POA: Diagnosis not present

## 2017-10-29 DIAGNOSIS — J302 Other seasonal allergic rhinitis: Secondary | ICD-10-CM | POA: Diagnosis not present

## 2017-10-29 DIAGNOSIS — J4540 Moderate persistent asthma, uncomplicated: Secondary | ICD-10-CM

## 2017-10-29 DIAGNOSIS — K219 Gastro-esophageal reflux disease without esophagitis: Secondary | ICD-10-CM

## 2017-10-29 DIAGNOSIS — G4733 Obstructive sleep apnea (adult) (pediatric): Secondary | ICD-10-CM

## 2017-10-29 NOTE — Patient Instructions (Signed)
Obstructive sleep apnea: Continue using CPAP nightly  Allergic rhinitis: Try taking generic Claritin daily, the other name for this is loratadine Continue montelukast Continue Flonase nose spray  Moderate persistent asthma: Continue Symbicort 2 puffs twice a day no matter how you feel Continue montelukast Ask for your insurance medication formulary and then call us once you have received this at the we can help you navigate an alternative to Symbicort Continue to use albuterol on an as-needed basis for chest tightness wheezing or shortness of breath  We will see you back in 6 months or sooner if needed

## 2017-10-29 NOTE — Progress Notes (Signed)
Synopsis: Former patient of Dr. Ashok Cordia who has obstructive sleep apnea, moderate persistent asthma, allergic rhinitis, and gastroesophageal reflux disease.  Subjective:   PATIENT ID: Tamara Daugherty GENDER: female DOB: 1951/05/26, MRN: [id number]   HPI  Chief Complaint  Patient presents with  . Follow-up    former [facility] patient, ROV     Asthma and allergic rhinitis: [name] says that she has been so-so lately.  The pollen has been bothering her a lot this year.  It is worse in the morning and whenever she is outside.  She has problems with her chest and with her sinuses.  Last week with the warm weather she had more trouble breathing.  She has been feeling more sinus congestion lately as well.  She is still taking singulair and flonase.  She isn't taking mucinex because it really didn't help.  She tried Human resources officer but she couldn't tell a difference.  She will sometimes do allegra but that does'nt help.  She is still taking Symbicort 2 puffs twice a day.  She is going on Medicare this summer as she turns 27.  She has been told that Symbicort is a tier 3.    She had to take some doxycyline recently for a sinus infection about 6 weeks ago.    OSA She is still using and benefitting from CPAP.    Past Medical History:  Diagnosis Date  . Anemia    takes Iron daily  . Anxiety   . Arthritis   . Asthma    Symbicort daily and Albuterol daily as needed  . Bruises easily    d/t being on Plavix   . Complication of anesthesia    long time to wake up - referred to pulmonary after carpel tunnel procedure - for asthma and undiagnosed sleep apnea  . GERD (gastroesophageal reflux disease)    takes Protonix daily  . Headache   . Heart murmur    [facility] [place] [facility]  . History of blood transfusion    no abnormal reaction noted  . History of colon polyps    beningn  . History of migraine    couple of wks ago was the last one  . Hyperlipidemia    takes Atorvastatin daily  . Hypertension    takes Benicar and Diltiazem daily  . Hypothyroidism    takes Synthroid daily  . Itching    takes Atarax daily as needed  . Joint pain   . Joint swelling   . Multiple allergies    takes Claritin daily as needed as well as using Flonase if needed  . Muscle spasm    takes Baclofen if needed  . Peripheral edema    takes Torsemide daily  . Pneumonia    hx of  . Primary localized osteoarthritis of left knee 06/21/2014  . Primary localized osteoarthritis of right knee 03/21/2015  . Sleep apnea    cpap  . TIA (transient ischemic attack)    takes Plavix daily;notices right side is slightly weaker than left  . Vertigo    takes Meclizine daily as needed     Family History  Problem Relation Age of Onset  . Scleroderma Mother   . Rheum arthritis Mother   . Lung cancer Father   . Stroke Brother   . Cancer Maternal Aunt   . Heart disease Paternal Aunt   . Lung cancer Paternal Uncle   . Cancer Cousin   . Lung disease Neg Hx  Social History   Socioeconomic History  . Marital status: Married    Spouse name: Not on file  . Number of children: Not on file  . Years of education: Not on file  . Highest education level: Not on file  Occupational History  . Not on file  Social Needs  . Financial resource strain: Not on file  . Food insecurity:    Worry: Not on file    Inability: Not on file  . Transportation needs:    Medical: Not on file    Non-medical: Not on file  Tobacco Use  . Smoking status: Passive Smoke Exposure - Never Smoker  . Smokeless tobacco: Never Used  . Tobacco comment: Father smoked briefly.   Substance and Sexual Activity  . Alcohol use: Yes    Comment: social  . Drug use: No  . Sexual activity: Not on file  Lifestyle  . Physical activity:    Days per week: Not on file    Minutes per session: Not on file  . Stress: Not on file  Relationships  . Social connections:    Talks on phone: Not on file    Gets together: Not on file    Attends religious  service: Not on file    Active member of club or organization: Not on file    Attends meetings of clubs or organizations: Not on file    Relationship status: Not on file  . Intimate partner violence:    Fear of current or ex partner: Not on file    Emotionally abused: Not on file    Physically abused: Not on file    Forced sexual activity: Not on file  Other Topics Concern  . Not on file  Social History Narrative   [facility] (08/30/16):   Originally from [place]. 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 No pets currently. No bird, mold, or hot tub exposure. Does have indoor plants. No draperies. Does have carpet in the bedroom. No down bedding that she is aware of.      Allergies  Allergen Reactions  . Bee Venom Anaphylaxis  . Diphenhydramine Hcl Other (See Comments)    Paradoxical reaction/ becomes hyper for days  . Penicillins Hives    No reaction to Ancef.  OK to give.    Marland Kitchen Percocet [Oxycodone-Acetaminophen] Other (See Comments)    hallucination  . Propoxyphene   . Sulfa Antibiotics Itching     Outpatient Medications Prior to Visit  Medication Sig Dispense Refill  . albuterol (PROVENTIL HFA;VENTOLIN HFA) 108 (90 BASE) MCG/ACT inhaler Inhale 2 puffs into the lungs every 6 (six) hours as needed for wheezing or shortness of breath.    . budesonide-formoterol (SYMBICORT) 160-4.5 MCG/ACT inhaler Inhale 2 puffs into the lungs 2 (two) times daily.    Marland Kitchen CALCIUM-VITAMIN D PO Take 1 tablet by mouth daily.    . clopidogrel (PLAVIX) 75 MG tablet Take 1 tablet by mouth daily.    Marland Kitchen diltiazem (MATZIM LA) 240 MG 24 hr tablet Take 240 mg by mouth daily.    Marland Kitchen EPINEPHrine 0.3 mg/0.3 mL IJ SOAJ injection Inject 0.3 mg into the muscle once.    . fluticasone (FLONASE) 50 MCG/ACT nasal spray Place 1 spray into both nostrils 2 (two) times daily as needed for allergies or rhinitis.    Marland Kitchen guaiFENesin  (MUCINEX) 600 MG 12 hr tablet Take 600 mg  by mouth 2 (two) times daily as needed for to loosen phlegm.    Marland Kitchen guaiFENesin-codeine (ROBITUSSIN AC) 100-10 MG/5ML syrup Take 10 mLs by mouth 4 (four) times daily as needed for cough.    . hydrOXYzine (ATARAX/VISTARIL) 25 MG tablet Take 25 mg by mouth every 8 (eight) hours as needed for itching.    . levothyroxine (SYNTHROID, LEVOTHROID) 50 MCG tablet Take 50 mcg by mouth daily before breakfast.    . loratadine (CLARITIN) 10 MG tablet Take 10 mg by mouth daily as needed for allergies.    Marland Kitchen meclizine (ANTIVERT) 25 MG tablet Take 25 mg by mouth 4 (four) times daily as needed for dizziness.    . montelukast (SINGULAIR) 10 MG tablet Take 1 tablet (10 mg total) by mouth at bedtime. 90 tablet 3  . Multiple Vitamins-Minerals (MULTIVITAMIN WITH MINERALS) tablet Take 1 tablet by mouth daily.    Marland Kitchen olmesartan-hydrochlorothiazide (BENICAR HCT) 40-25 MG per tablet Take 1 tablet by mouth daily.    . Omega-3 Fatty Acids (FISH OIL PO) Take 1 capsule by mouth 2 (two) times daily.    . pantoprazole (PROTONIX) 40 MG tablet Take 40 mg by mouth daily.    . potassium chloride (MICRO-K) 10 MEQ CR capsule Take 30 mEq by mouth 2 (two) times daily.    Marland Kitchen pyridOXINE (B-6) 50 MG tablet Take 50 mg by mouth daily.    . simvastatin (ZOCOR) 20 MG tablet Take 20 mg by mouth daily.    Marland Kitchen Spacer/Aero-Holding Chambers (AEROCHAMBER MV) inhaler Use as instructed 1 each 0  . torsemide (DEMADEX) 20 MG tablet Take 20 mg by mouth as needed.      No facility-administered medications prior to visit.     Review of Systems  Constitutional: Positive for malaise/fatigue. Negative for chills, diaphoresis and fever.  HENT: Positive for congestion. Negative for ear discharge and nosebleeds.   Respiratory: Positive for cough, shortness of breath and wheezing. Negative for sputum production.       Objective:  Physical Exam   Vitals:   10/29/17 0917  BP: 130/80  Pulse: 94  SpO2: 95%  Weight:  (!) 303 lb (137.4 kg)  Height: 5\' 6"  (1.676 m)   Gen: well appearing HENT: OP clear, TM's clear, neck supple PULM: CTA B, normal percussion CV: RRR, no mgr, trace edema GI: BS+, soft, nontender Derm: no cyanosis or rash Psyche: normal mood and affect   CBC    Component Value Date/Time   WBC 8.2 08/30/2016 1331   RBC 4.08 08/30/2016 1331   HGB 12.1 08/30/2016 1331   HCT 36.3 08/30/2016 1331   PLT 360.0 08/30/2016 1331   MCV 89.0 08/30/2016 1331   MCH 29.1 03/23/2015 0322   MCHC 33.2 08/30/2016 1331   RDW 16.8 (H) 08/30/2016 1331   LYMPHSABS 3.0 08/30/2016 1331   MONOABS 0.8 08/30/2016 1331   EOSABS 0.3 08/30/2016 1331   BASOSABS 0.1 08/30/2016 1331     PFT 01/09/17: FVC 2.24 L (80%) FEV1 1.67 L (76%) FEV1/FVC 0.75 FEF 25-75 1.25 L (60%) negative bronchodilator response                                                                DLCO corrected 62% 09/27/16: FVC 1.92 L (69%) FEV1 1.51 L (  69%) FEV1/FVC 0.79 FEF 25-75 1.37 L (66%) negative bronchodilator response TLC 3.46 L (64%) RV 71% ERV 22% DLCO corrected 66%  6MWT 04/21/17:  Walked 318 meters / Baseline Sat 100% on RA / Nadir Sat 95% on RA 01/03/17:  Walked 446 meters / Baseline Sat 98% on RA / Nadir Sat 87% on RA @ 5:50 (required 2 L/m with exertion to maintain) 09/27/16:  Walked 318 meters / Baseline Sat 100% on RA / Nadir Sat 93% on RA (rested for 1 min)  IMAGING HRCT CHEST W/O 01/16/17:  Mild dilation of the pulmonic trunk measuring 4.1 cm. Mild tracheobronchomalacia. Cardiomegaly noted. No bronchiectasis. No reticulation, intralobular septal thickening, or medical changes to suggest interstitial lung disease. No parenchymal mass or opacification. No pleural effusion or thickening. No pericardial effusion. No pathologic mediastinal adenopathy.   CXR PA/LAT 08/30/16 :  Borderline mild cardiomegaly. Low lung volumes. No focal opacity or effusion appreciated. Mediastinum normal in contour.  CARDIAC TTE (01/16/17):  LV  moderately dilated with EF 55-60%. No regional wall motion abnormalities. LA & RA normal in size. RV normal in size and function. No aortic stenosis or regurgitation. Aortic root normal in size. Mild mitral regurgitation without stenosis. Trivial pulmonic regurgitation without stenosis. Mild tricuspid regurgitation. No pericardial effusion.  LABS  08/30/16 CBC: 8.2/12.1/36.3/360 Eosinophils: 0.3 IgE: 121 RAST panel: Marginal/week positives       Assessment & Plan:   Moderate persistent asthma without complication  Chronic seasonal allergic rhinitis  Gastroesophageal reflux disease, esophagitis presence not specified  OSA on CPAP  Discussion: This has been a stable interval for [facility] but she is been having a bit more allergic rhinitis symptoms recently.  No recent exacerbations of asthma which is a good thing.  She remains compliant with Symbicort and is concerned about coverage for this when she changes to Medicare.  I explained to her today that she needs to go over her insurance medication formulary and call us so that we can help navigate this with her.    Her allergic rhinitis has been poorly controlled lately, likely due to the fact that she is not taking an antihistamine.  She is compliant with her generic montelukast as well as Flonase.  Plan: Obstructive sleep apnea: Continue using CPAP nightly  Allergic rhinitis: Try taking generic Claritin daily, the other name for this is loratadine Continue montelukast Continue Flonase nose spray  Moderate persistent asthma: Continue Symbicort 2 puffs twice a day no matter how you feel Continue montelukast Ask for your insurance medication formulary and then call us once you have received this at the we can help you navigate an alternative to Symbicort Continue to use albuterol on an as-needed basis for chest tightness wheezing or shortness of breath  We will see you back in 6 months or sooner if needed  > 50% of this 27 minute  visit spent face to face    Current Outpatient Medications:  .  albuterol (PROVENTIL HFA;VENTOLIN HFA) 108 (90 BASE) MCG/ACT inhaler, Inhale 2 puffs into the lungs every 6 (six) hours as needed for wheezing or shortness of breath., Disp: , Rfl:  .  budesonide-formoterol (SYMBICORT) 160-4.5 MCG/ACT inhaler, Inhale 2 puffs into the lungs 2 (two) times daily., Disp: , Rfl:  .  CALCIUM-VITAMIN D PO, Take 1 tablet by mouth daily., Disp: , Rfl:  .  clopidogrel (PLAVIX) 75 MG tablet, Take 1 tablet by mouth daily., Disp: , Rfl:  .  diltiazem (MATZIM LA) 240 MG 24 hr tablet, Take 240  mg by mouth daily., Disp: , Rfl:  .  EPINEPHrine 0.3 mg/0.3 mL IJ SOAJ injection, Inject 0.3 mg into the muscle once., Disp: , Rfl:  .  fluticasone (FLONASE) 50 MCG/ACT nasal spray, Place 1 spray into both nostrils 2 (two) times daily as needed for allergies or rhinitis., Disp: , Rfl:  .  guaiFENesin (MUCINEX) 600 MG 12 hr tablet, Take 600 mg by mouth 2 (two) times daily as needed for to loosen phlegm., Disp: , Rfl:  .  guaiFENesin-codeine (ROBITUSSIN AC) 100-10 MG/5ML syrup, Take 10 mLs by mouth 4 (four) times daily as needed for cough., Disp: , Rfl:  .  hydrOXYzine (ATARAX/VISTARIL) 25 MG tablet, Take 25 mg by mouth every 8 (eight) hours as needed for itching., Disp: , Rfl:  .  levothyroxine (SYNTHROID, LEVOTHROID) 50 MCG tablet, Take 50 mcg by mouth daily before breakfast., Disp: , Rfl:  .  loratadine (CLARITIN) 10 MG tablet, Take 10 mg by mouth daily as needed for allergies., Disp: , Rfl:  .  meclizine (ANTIVERT) 25 MG tablet, Take 25 mg by mouth 4 (four) times daily as needed for dizziness., Disp: , Rfl:  .  montelukast (SINGULAIR) 10 MG tablet, Take 1 tablet (10 mg total) by mouth at bedtime., Disp: 90 tablet, Rfl: 3 .  Multiple Vitamins-Minerals (MULTIVITAMIN WITH MINERALS) tablet, Take 1 tablet by mouth daily., Disp: , Rfl:  .  olmesartan-hydrochlorothiazide (BENICAR HCT) 40-25 MG per tablet, Take 1 tablet by mouth  daily., Disp: , Rfl:  .  Omega-3 Fatty Acids (FISH OIL PO), Take 1 capsule by mouth 2 (two) times daily., Disp: , Rfl:  .  pantoprazole (PROTONIX) 40 MG tablet, Take 40 mg by mouth daily., Disp: , Rfl:  .  potassium chloride (MICRO-K) 10 MEQ CR capsule, Take 30 mEq by mouth 2 (two) times daily., Disp: , Rfl:  .  pyridOXINE (B-6) 50 MG tablet, Take 50 mg by mouth daily., Disp: , Rfl:  .  simvastatin (ZOCOR) 20 MG tablet, Take 20 mg by mouth daily., Disp: , Rfl:  .  Spacer/Aero-Holding Chambers (AEROCHAMBER MV) inhaler, Use as instructed, Disp: 1 each, Rfl: 0 .  torsemide (DEMADEX) 20 MG tablet, Take 20 mg by mouth as needed. , Disp: , Rfl:

## 2017-10-30 ENCOUNTER — Telehealth: Payer: Self-pay | Admitting: Pulmonary Disease

## 2017-10-30 MED ORDER — BUDESONIDE-FORMOTEROL FUMARATE 160-4.5 MCG/ACT IN AERO: 2.0000 | INHALATION_SPRAY | Freq: Two times a day (BID) | RESPIRATORY_TRACT | 0 refills | Status: DC

## 2017-10-30 NOTE — Telephone Encounter (Signed)
Spoke with pt, she states she received her formulary.   [email or web address]  Spoke with pt, she states that Dr. Lake Bells asked for her formulary and she states there is nothing much on there that is lower than a Tier 3. Her Rx's are going to be the same price or higher. She found a coupon for Symbicort online to receive a 90 day supply of Symbicort. She stated she would try the coupon and let us know what happens because the formulary doesn't show any inhalers that would be cheaper. Nothing further is needed at this time.

## 2017-12-15 ENCOUNTER — Telehealth: Payer: Self-pay | Admitting: Pulmonary Disease

## 2017-12-15 MED ORDER — ALBUTEROL SULFATE HFA 108 (90 BASE) MCG/ACT IN AERS: 2.0000 | INHALATION_SPRAY | Freq: Four times a day (QID) | RESPIRATORY_TRACT | 0 refills | Status: DC | PRN

## 2017-12-15 NOTE — Telephone Encounter (Signed)
Patient called and refills sent to preferred pharmacy.

## 2018-02-16 ENCOUNTER — Telehealth: Payer: Self-pay | Admitting: Pulmonary Disease

## 2018-02-16 NOTE — Telephone Encounter (Signed)
Called and spoke with patient regarding symb 160 is over $800, and ProAir HFA is over $76 since now on Medicare Printed pt asst forms for both medications today Mailed them to pt's home address today  Routing message to Burman Nieves to f/u later next week on the forms from pt.

## 2018-02-19 ENCOUNTER — Other Ambulatory Visit: Payer: Self-pay | Admitting: Pulmonary Disease

## 2018-02-19 MED ORDER — MONTELUKAST SODIUM 10 MG PO TABS: 10.0000 mg | ORAL_TABLET | Freq: Every day | ORAL | 3 refills | Status: DC

## 2018-02-19 NOTE — Telephone Encounter (Signed)
Attempted to call pt. I did not receive an answer. I have left a message for pt to return our call to see if the pt has received these forms.

## 2018-02-20 NOTE — Telephone Encounter (Signed)
Spoke with the pt  She states that she did receive the forms, but does not plan to fill them out b/c after reviewing them, she makes too much income Will go ahead and close encounter

## 2018-02-20 NOTE — Telephone Encounter (Signed)
Attempted to call pt. I did not receive an answer. I have left a message for pt to return our call to see if the pt has received these forms.

## 2018-02-25 ENCOUNTER — Encounter: Payer: Self-pay | Admitting: Nurse Practitioner

## 2018-02-25 ENCOUNTER — Telehealth: Payer: Self-pay | Admitting: Pulmonary Disease

## 2018-02-25 ENCOUNTER — Ambulatory Visit: Payer: BLUE CROSS/BLUE SHIELD | Admitting: Nurse Practitioner

## 2018-02-25 DIAGNOSIS — J302 Other seasonal allergic rhinitis: Secondary | ICD-10-CM

## 2018-02-25 DIAGNOSIS — Z9989 Dependence on other enabling machines and devices: Secondary | ICD-10-CM

## 2018-02-25 DIAGNOSIS — G4733 Obstructive sleep apnea (adult) (pediatric): Secondary | ICD-10-CM

## 2018-02-25 MED ORDER — ALBUTEROL SULFATE HFA 108 (90 BASE) MCG/ACT IN AERS: 2.0000 | INHALATION_SPRAY | Freq: Four times a day (QID) | RESPIRATORY_TRACT | 0 refills | Status: DC | PRN

## 2018-02-25 MED ORDER — MONTELUKAST SODIUM 10 MG PO TABS: 10.0000 mg | ORAL_TABLET | Freq: Every day | ORAL | 3 refills | Status: DC

## 2018-02-25 MED ORDER — MONTELUKAST SODIUM 10 MG PO TABS: 10.0000 mg | ORAL_TABLET | Freq: Every day | ORAL | 3 refills | Status: AC

## 2018-02-25 MED ORDER — BUDESONIDE-FORMOTEROL FUMARATE 160-4.5 MCG/ACT IN AERO: 2.0000 | INHALATION_SPRAY | Freq: Two times a day (BID) | RESPIRATORY_TRACT | 0 refills | Status: DC

## 2018-02-25 MED ORDER — BUDESONIDE-FORMOTEROL FUMARATE 160-4.5 MCG/ACT IN AERO
2.0000 | INHALATION_SPRAY | Freq: Two times a day (BID) | RESPIRATORY_TRACT | 0 refills | Status: DC
Start: 2018-02-25 — End: 2018-02-25

## 2018-02-25 NOTE — Assessment & Plan Note (Signed)
Continue claritin. °

## 2018-02-25 NOTE — Telephone Encounter (Signed)
Spoke with [employer], confirmed address is in [place], scripts sent. Nothing further needed at this time.

## 2018-02-25 NOTE — Assessment & Plan Note (Addendum)
Patient Instructions  Continue CPAP at night at current settings - nasal pillows Patient continues to benefit from CPAP with good compliance and control documented.  Do not drive if drowsy Wear for at least 4 hours each night Refilled medications Follow up with Dr. Pennie Banter in 6 months or sooner if needed

## 2018-02-25 NOTE — Progress Notes (Signed)
@Patient  ID: Tamara Daugherty, female    DOB: [birth year], 67 y.o.   MRN: [id number]  Chief Complaint  Patient presents with  . Follow-up    CPAP - visit required due to change in insurance.    Referring provider: Eber Hong, MD  HPI  67 year old female never smoker with moderate persistent asthma, allergic rhinitis followed by Dr. Lake Bells. Health history includes OSA on CPAP, GERD.  Tests: PFT 01/09/17: FVC 2.24 L (80%) FEV1 1.67 L (76%) FEV1/FVC 0.75 FEF 25-75 1.25 L (60%) negative bronchodilator response                                                                DLCO corrected 62% 09/27/16: FVC 1.92 L (69%) FEV1 1.51 L (69%) FEV1/FVC 0.79 FEF 25-75 1.37 L (66%) negative bronchodilator response TLC 3.46 L (64%) RV 71% ERV 22% DLCO corrected 66%  6MWT 04/21/17:  Walked 318 meters / Baseline Sat 100% on RA / Nadir Sat 95% on RA 01/03/17:  Walked 446 meters / Baseline Sat 98% on RA / Nadir Sat 87% on RA @ 5:50 (required 2 L/m with exertion to maintain) 09/27/16: Walked 318 meters / Baseline Sat 100% on RA / Nadir Sat 93% on RA (rested for 1 min)  IMAGING HRCT CHEST W/O 01/16/17:  Mild dilation of the pulmonic trunk measuring 4.1 cm. Mild tracheobronchomalacia. Cardiomegaly noted. No bronchiectasis. No reticulation, intralobular septal thickening, or medical changes to suggest interstitial lung disease. No parenchymal mass or opacification. No pleural effusion or thickening. No pericardial effusion. No pathologic mediastinal adenopathy.   CXR PA/LAT 08/30/16 :  Borderline mild cardiomegaly. Low lung volumes. No focal opacity or effusion appreciated. Mediastinum normal in contour.  CARDIAC TTE (01/16/17):  LV moderately dilated with EF 55-60%. No regional wall motion abnormalities. LA & RA normal in size. RV normal in size and function. No aortic stenosis or regurgitation. Aortic root normal in size. Mild mitral regurgitation without stenosis. Trivial pulmonic regurgitation without  stenosis. Mild tricuspid regurgitation. No pericardial effusion.  LABS  08/30/16 CBC: 8.2/12.1/36.3/360 Eosinophils: 0.3 IgE: 121 RAST panel: Marginal/week positives  OV 02/25/18 - CPAP follow up   Patient presents for CPAP follow up today. She has been compliant with CPAP. Uses it daily. Has switched from face mask to nasal pillows and feels that this works much better for her. She feels that she continues to benefit from the CPAP and that she has more energy throughout the day. Denies any issues with CPAP.   CPAP compliance report 01/25/18 - 02/23/18 Usage days - 30/30 (100%), average usage 7 hours 48 minutes, CPAP set pressure at 8 cm H2O. AHI 2.1  Allergies  Allergen Reactions  . Bee Venom Anaphylaxis  . Diphenhydramine Hcl Other (See Comments)    Paradoxical reaction/ becomes hyper for days  . Penicillins Hives    No reaction to Ancef.  OK to give.    Marland Kitchen Percocet [Oxycodone-Acetaminophen] Other (See Comments)    hallucination  . Propoxyphene   . Sulfa Antibiotics Itching    There is no immunization history for the selected administration types on file for this patient.  Past Medical History:  Diagnosis Date  . Anemia    takes Iron daily  . Anxiety   . Arthritis   .  Asthma    Symbicort daily and Albuterol daily as needed  . Bruises easily    d/t being on Plavix   . Complication of anesthesia    long time to wake up - referred to pulmonary after carpel tunnel procedure - for asthma and undiagnosed sleep apnea  . GERD (gastroesophageal reflux disease)    takes Protonix daily  . Headache   . Heart murmur    [facility] [place]  . History of blood transfusion    no abnormal reaction noted  . History of colon polyps    beningn  . History of migraine    couple of wks ago was the last one  . Hyperlipidemia    takes Atorvastatin daily  . Hypertension    takes Benicar and Diltiazem daily  . Hypothyroidism    takes Synthroid daily  . Itching    takes Atarax  daily as needed  . Joint pain   . Joint swelling   . Multiple allergies    takes Claritin daily as needed as well as using Flonase if needed  . Muscle spasm    takes Baclofen if needed  . Peripheral edema    takes Torsemide daily  . Pneumonia    hx of  . Primary localized osteoarthritis of left knee 06/21/2014  . Primary localized osteoarthritis of right knee 03/21/2015  . Sleep apnea    cpap  . TIA (transient ischemic attack)    takes Plavix daily;notices right side is slightly weaker than left  . Vertigo    takes Meclizine daily as needed    Tobacco History: Social History   Tobacco Use  Smoking Status Passive Smoke Exposure - Never Smoker  Smokeless Tobacco Never Used  Tobacco Comment   Father smoked briefly.    Counseling given: Yes Comment: Father smoked briefly.    Outpatient Encounter Medications as of 02/25/2018  Medication Sig  . albuterol (PROVENTIL HFA;VENTOLIN HFA) 108 (90 Base) MCG/ACT inhaler Inhale 2 puffs into the lungs every 6 (six) hours as needed for wheezing or shortness of breath.  . budesonide-formoterol (SYMBICORT) 160-4.5 MCG/ACT inhaler Inhale 2 puffs into the lungs 2 (two) times daily.  Marland Kitchen CALCIUM-VITAMIN D PO Take 1 tablet by mouth daily.  . clopidogrel (PLAVIX) 75 MG tablet Take 1 tablet by mouth daily.  Marland Kitchen diltiazem (MATZIM LA) 240 MG 24 hr tablet Take 240 mg by mouth daily.  Marland Kitchen EPINEPHrine 0.3 mg/0.3 mL IJ SOAJ injection Inject 0.3 mg into the muscle once.  . fluticasone (FLONASE) 50 MCG/ACT nasal spray Place 1 spray into both nostrils 2 (two) times daily as needed for allergies or rhinitis.  Marland Kitchen guaiFENesin (MUCINEX) 600 MG 12 hr tablet Take 600 mg by mouth 2 (two) times daily as needed for to loosen phlegm.  Marland Kitchen guaiFENesin-codeine (ROBITUSSIN AC) 100-10 MG/5ML syrup Take 10 mLs by mouth 4 (four) times daily as needed for cough.  . hydrOXYzine (ATARAX/VISTARIL) 25 MG tablet Take 25 mg by mouth every 8 (eight) hours as needed for itching.  .  levothyroxine (SYNTHROID, LEVOTHROID) 50 MCG tablet Take 50 mcg by mouth daily before breakfast.  . loratadine (CLARITIN) 10 MG tablet Take 10 mg by mouth daily as needed for allergies.  Marland Kitchen meclizine (ANTIVERT) 25 MG tablet Take 25 mg by mouth 4 (four) times daily as needed for dizziness.  . montelukast (SINGULAIR) 10 MG tablet Take 1 tablet (10 mg total) by mouth at bedtime.  . Multiple Vitamins-Minerals (MULTIVITAMIN WITH MINERALS) tablet Take 1 tablet  by mouth daily.  Marland Kitchen olmesartan-hydrochlorothiazide (BENICAR HCT) 40-25 MG per tablet Take 1 tablet by mouth daily.  . Omega-3 Fatty Acids (FISH OIL PO) Take 1 capsule by mouth 2 (two) times daily.  . pantoprazole (PROTONIX) 40 MG tablet Take 40 mg by mouth daily.  . potassium chloride (MICRO-K) 10 MEQ CR capsule Take 30 mEq by mouth 2 (two) times daily.  Marland Kitchen pyridOXINE (B-6) 50 MG tablet Take 50 mg by mouth daily.  . simvastatin (ZOCOR) 20 MG tablet Take 20 mg by mouth daily.  Marland Kitchen Spacer/Aero-Holding Chambers (AEROCHAMBER MV) inhaler Use as instructed  . torsemide (DEMADEX) 20 MG tablet Take 20 mg by mouth as needed.   . [DISCONTINUED] albuterol (PROVENTIL HFA;VENTOLIN HFA) 108 (90 Base) MCG/ACT inhaler Inhale 2 puffs into the lungs every 6 (six) hours as needed for wheezing or shortness of breath.  . [DISCONTINUED] budesonide-formoterol (SYMBICORT) 160-4.5 MCG/ACT inhaler Inhale 2 puffs into the lungs 2 (two) times daily.  . [DISCONTINUED] montelukast (SINGULAIR) 10 MG tablet Take 1 tablet (10 mg total) by mouth at bedtime.  . budesonide-formoterol (SYMBICORT) 160-4.5 MCG/ACT inhaler Inhale 2 puffs into the lungs 2 (two) times daily.  . [DISCONTINUED] albuterol (PROVENTIL HFA;VENTOLIN HFA) 108 (90 Base) MCG/ACT inhaler Inhale 2 puffs into the lungs every 6 (six) hours as needed for wheezing or shortness of breath.  . [DISCONTINUED] budesonide-formoterol (SYMBICORT) 160-4.5 MCG/ACT inhaler Inhale 2 puffs into the lungs 2 (two) times daily.  .  [DISCONTINUED] montelukast (SINGULAIR) 10 MG tablet Take 1 tablet (10 mg total) by mouth at bedtime.   No facility-administered encounter medications on file as of 02/25/2018.      Review of Systems  Review of Systems  Constitutional: Negative.  Negative for fever.  HENT: Negative.   Respiratory: Negative for cough and shortness of breath.   Cardiovascular: Negative.   Gastrointestinal: Negative.   Allergic/Immunologic: Negative.   Neurological: Negative.   Psychiatric/Behavioral: Negative.        Physical Exam  BP 130/74 (BP Location: Right Arm, Patient Position: Sitting, Cuff Size: Large)   Pulse 60   Ht 5\' 6"  (1.676 m)   Wt (!) 305 lb 6.4 oz (138.5 kg)   SpO2 97%   BMI 49.29 kg/m   Wt Readings from Last 5 Encounters:  02/25/18 (!) 305 lb 6.4 oz (138.5 kg)  10/29/17 (!) 303 lb (137.4 kg)  04/21/17 284 lb (128.8 kg)  01/30/17 292 lb (132.5 kg)  01/09/17 292 lb (132.5 kg)     Physical Exam  Constitutional: She is oriented to person, place, and time. She appears well-developed and well-nourished. No distress.  Cardiovascular: Normal rate and regular rhythm.  Pulmonary/Chest: Effort normal and breath sounds normal.  Neurological: She is alert and oriented to person, place, and time.  Psychiatric: She has a normal mood and affect.  Nursing note and vitals reviewed.     Assessment & Plan:   OSA on CPAP Patient Instructions  Continue CPAP at night at current settings - nasal pillows Patient continues to benefit from CPAP with good compliance and control documented.  Do not drive if drowsy [place] for at least 4 hours each night Refilled medications Follow up with Dr. Pennie Banter in 6 months or sooner if needed    Chronic seasonal allergic rhinitis Continue claritin     Fenton Foy, NP 02/25/2018

## 2018-02-25 NOTE — Patient Instructions (Addendum)
Continue CPAP at night at current settings - nasal pillows Patient continues to benefit from CPAP with good compliance and control documented.  Do not drive if drowsy Wear for at least 4 hours each night Refilled medications Follow up with Dr. Pennie Banter in 6 months or sooner if needed

## 2018-03-02 NOTE — Progress Notes (Signed)
Reviewed, agree 

## 2018-04-20 ENCOUNTER — Other Ambulatory Visit: Payer: Self-pay | Admitting: Pulmonary Disease

## 2018-04-22 ENCOUNTER — Encounter: Payer: Self-pay | Admitting: Pulmonary Disease

## 2018-04-22 ENCOUNTER — Ambulatory Visit: Payer: Medicare PPO | Admitting: Pulmonary Disease

## 2018-04-22 ENCOUNTER — Ambulatory Visit (INDEPENDENT_AMBULATORY_CARE_PROVIDER_SITE_OTHER)
Admission: RE | Admit: 2018-04-22 | Discharge: 2018-04-22 | Disposition: A | Discharge status: Home / Self Care | Payer: Medicare PPO | Source: Ambulatory Visit | Attending: Pulmonary Disease | Admitting: Pulmonary Disease

## 2018-04-22 VITALS — BP 134/72 | HR 80 | Ht 66.0 in | Wt 311.0 lb

## 2018-04-22 DIAGNOSIS — R0600 Dyspnea, unspecified: Secondary | ICD-10-CM

## 2018-04-22 DIAGNOSIS — Z9989 Dependence on other enabling machines and devices: Secondary | ICD-10-CM | POA: Diagnosis not present

## 2018-04-22 DIAGNOSIS — J302 Other seasonal allergic rhinitis: Secondary | ICD-10-CM

## 2018-04-22 DIAGNOSIS — G4733 Obstructive sleep apnea (adult) (pediatric): Secondary | ICD-10-CM

## 2018-04-22 DIAGNOSIS — K219 Gastro-esophageal reflux disease without esophagitis: Secondary | ICD-10-CM | POA: Diagnosis not present

## 2018-04-22 DIAGNOSIS — J4540 Moderate persistent asthma, uncomplicated: Secondary | ICD-10-CM | POA: Diagnosis not present

## 2018-04-22 LAB — NITRIC OXIDE: Nitric Oxide: 20

## 2018-04-22 MED ORDER — BUDESONIDE-FORMOTEROL FUMARATE 160-4.5 MCG/ACT IN AERO: 2.0000 | INHALATION_SPRAY | Freq: Two times a day (BID) | RESPIRATORY_TRACT | 0 refills | Status: DC

## 2018-04-22 NOTE — Progress Notes (Signed)
Synopsis: Former patient of Dr. Ashok Daugherty who has obstructive sleep apnea, moderate persistent asthma, allergic rhinitis, and gastroesophageal reflux disease.  Subjective:   PATIENT ID: Tamara Daugherty GENDER: female DOB: [birth year], MRN: [id number]   HPI  Chief Complaint  Patient presents with  . Follow-up    pt hospitalized for asthma attack, bronchitis.  Pt hospitalized in [place].  Pt states she is improved since hospitalization, does note some congestion.  Also wants to discuss Symbicort alternatives.     Tamara Daugherty says that she was hospitlized for bronchitis and asthma recently.  She was adminstered IV steroids and antibiotics.  She says that her sodium level was quite low at the time.  She was seen by a pulmonary doctor named Dr. Chancy Daugherty.  She saw a lot of doctors at the time.    She says that she feel OK now.  However last week she felt like she was getting sick all over again.  Her PCP called in prednisone again for 20mg  daily.  She has been taking that for 7 days, and she is now taking 10mg  daily.  She is not aware of any change in her house.    She says that when she is sick she cannot breathe well.  She says that this was her major problem. She says taht the breathing treabments in the hospital really helped.  She was briefly treated with oxygen while in the hospital.  She was in the hospital for about 6 days.  She has been amintained on Symbicort.    She continues to use CPAP daliy and she says she is cleaning it daily.    She doesn't feel that her GERD is worse.    Past Medical History:  Diagnosis Date  . Anemia    takes Iron daily  . Anxiety   . Arthritis   . Asthma    Symbicort daily and Albuterol daily as needed  . Bruises easily    d/t being on Plavix   . Complication of anesthesia    long time to wake up - referred to pulmonary after carpel tunnel procedure - for asthma and undiagnosed sleep apnea  . GERD (gastroesophageal reflux disease)    takes Protonix daily    . Headache   . Heart murmur    [facility] [place] [facility]  . History of blood transfusion    no abnormal reaction noted  . History of colon polyps    beningn  . History of migraine    couple of wks ago was the last one  . Hyperlipidemia    takes Atorvastatin daily  . Hypertension    takes Benicar and Diltiazem daily  . Hypothyroidism    takes Synthroid daily  . Itching    takes Atarax daily as needed  . Joint pain   . Joint swelling   . Multiple allergies    takes Claritin daily as needed as well as using Flonase if needed  . Muscle spasm    takes Baclofen if needed  . Peripheral edema    takes Torsemide daily  . Pneumonia    hx of  . Primary localized osteoarthritis of left knee 06/21/2014  . Primary localized osteoarthritis of right knee 03/21/2015  . Sleep apnea    cpap  . TIA (transient ischemic attack)    takes Plavix daily;notices right side is slightly weaker than left  . Vertigo    takes Meclizine daily as needed       Review of  Systems  Constitutional: Positive for malaise/fatigue. Negative for chills, diaphoresis and fever.  HENT: Positive for congestion. Negative for ear discharge and nosebleeds.   Respiratory: Positive for cough, shortness of breath and wheezing. Negative for sputum production.       Objective:  Physical Exam   Vitals:   04/22/18 1528  BP: 134/72  Pulse: 80  SpO2: 98%  Weight: (!) 311 lb (141.1 kg)  Height: 5\' 6"  (1.676 m)    Gen: obese but well appearing HENT: OP clear, TM's clear, neck supple PULM: Crackles in bases, no wheezing, normal percussion CV: RRR, no mgr, trace edema GI: BS+, soft, nontender Derm: no cyanosis or rash Psyche: normal mood and affect  CBC    Component Value Date/Time   WBC 8.2 08/30/2016 1331   RBC 4.08 08/30/2016 1331   HGB 12.1 08/30/2016 1331   HCT 36.3 08/30/2016 1331   PLT 360.0 08/30/2016 1331   MCV 89.0 08/30/2016 1331   MCH 29.1 03/23/2015 0322   MCHC 33.2 08/30/2016 1331    RDW 16.8 (H) 08/30/2016 1331   LYMPHSABS 3.0 08/30/2016 1331   MONOABS 0.8 08/30/2016 1331   EOSABS 0.3 08/30/2016 1331   BASOSABS 0.1 08/30/2016 1331     PFT 01/09/17: FVC 2.24 L (80%) FEV1 1.67 L (76%) FEV1/FVC 0.75 FEF 25-75 1.25 L (60%) negative bronchodilator response                                                                DLCO corrected 62% 09/27/16: FVC 1.92 L (69%) FEV1 1.51 L (69%) FEV1/FVC 0.79 FEF 25-75 1.37 L (66%) negative bronchodilator response TLC 3.46 L (64%) RV 71% ERV 22% DLCO corrected 66%  6MWT 04/21/17:  Walked 318 meters / Baseline Sat 100% on RA / Nadir Sat 95% on RA 01/03/17:  Walked 446 meters / Baseline Sat 98% on RA / Nadir Sat 87% on RA @ 5:50 (required 2 L/m with exertion to maintain) 09/27/16:  Walked 318 meters / Baseline Sat 100% on RA / Nadir Sat 93% on RA (rested for 1 min)  IMAGING HRCT CHEST W/O 01/16/17:  Mild dilation of the pulmonic trunk measuring 4.1 cm. Mild tracheobronchomalacia. Cardiomegaly noted. No bronchiectasis. No reticulation, intralobular septal thickening, or medical changes to suggest interstitial lung disease. No parenchymal mass or opacification. No pleural effusion or thickening. No pericardial effusion. No pathologic mediastinal adenopathy.   CXR PA/LAT 08/30/16 :  Borderline mild cardiomegaly. Low lung volumes. No focal opacity or effusion appreciated. Mediastinum normal in contour.  CARDIAC TTE (01/16/17):  LV moderately dilated with EF 55-60%. No regional wall motion abnormalities. LA & RA normal in size. RV normal in size and function. No aortic stenosis or regurgitation. Aortic root normal in size. Mild mitral regurgitation without stenosis. Trivial pulmonic regurgitation without stenosis. Mild tricuspid regurgitation. No pericardial effusion.  LABS  08/30/16 CBC: 8.2/12.1/36.3/360 Eosinophils: 0.3 IgE: 121 RAST panel: Marginal/week positives       Assessment & Plan:   OSA on CPAP  Moderate persistent asthma  without complication  Gastroesophageal reflux disease, esophagitis presence not specified  Chronic seasonal allergic rhinitis  Discussion: I am concerned about [name] because she recently had a severe exacerbation of asthma and then about a week after hospital discharge had another exacerbation and  is currently having to take prednisone.  1 year ago when she saw my partner she had an elevated serum eosinophil count though it was relatively asymptomatic at the time.  I am concerned about allergic asthma.  The differential diagnosis includes a heart failure exacerbation as well as she says that she did have some ankle swelling prior to her recent hospitalization and she has crackles on physical exam.  Unfortunately she is taking prednisone today so I cannot assess her blood work to look for evidence of an underlying allergy today.  Plan: Obstructive sleep apnea Keep using CPAP nightly Allergic rhinitis: Continue Claritin daily Continue Flonase daily Continue montelukast daily  We will request records from the hospital discharge from [place] as well as recent blood work and office visit notes from your primary care doctor a week ago  Severe persistent asthma with recent recurrent exacerbations: CXR today PFT FENO I am concerned that there is an allergy that may be driving this Stop taking prednisone after tomorrow's dose Come see me in the [place] office next week and we will check blood work: Serum IgE, serum CBC with differential to look for eosinophils (allergy cells that can make your asthma worse) Continue Symbicort 2 puffs twice a day, take it with a spacer  Gastroesophageal reflux disease: Poorly controlled gastroesophageal reflux disease can make asthma control worse Try taking Protonix twice a day for the next 7 days  We will see you back in 1 week at the [place] office or sooner if needed    Current Outpatient Medications:  .   albuterol (PROVENTIL HFA;VENTOLIN HFA) 108 (90 Base) MCG/ACT inhaler, Inhale 2 puffs into the lungs every 6 (six) hours as needed for wheezing or shortness of breath., Disp: 3 Inhaler, Rfl: 0 .  budesonide-formoterol (SYMBICORT) 160-4.5 MCG/ACT inhaler, Inhale 2 puffs into the lungs 2 (two) times daily., Disp: 1 Inhaler, Rfl: 0 .  CALCIUM-VITAMIN D PO, Take 1 tablet by mouth daily., Disp: , Rfl:  .  clopidogrel (PLAVIX) 75 MG tablet, Take 1 tablet by mouth daily., Disp: , Rfl:  .  diltiazem (MATZIM LA) 240 MG 24 hr tablet, Take 240 mg by mouth daily., Disp: , Rfl:  .  EPINEPHrine 0.3 mg/0.3 mL IJ SOAJ injection, Inject 0.3 mg into the muscle once., Disp: , Rfl:  .  fluticasone (FLONASE) 50 MCG/ACT nasal spray, Place 1 spray into both nostrils 2 (two) times daily as needed for allergies or rhinitis., Disp: , Rfl:  .  guaiFENesin (MUCINEX) 600 MG 12 hr tablet, Take 600 mg by mouth 2 (two) times daily as needed for to loosen phlegm., Disp: , Rfl:  .  guaiFENesin-codeine (ROBITUSSIN AC) 100-10 MG/5ML syrup, Take 10 mLs by mouth 4 (four) times daily as needed for cough., Disp: , Rfl:  .  hydrOXYzine (ATARAX/VISTARIL) 25 MG tablet, Take 25 mg by mouth every 8 (eight) hours as needed for itching., Disp: , Rfl:  .  levothyroxine (SYNTHROID, LEVOTHROID) 50 MCG tablet, Take 50 mcg by mouth daily before breakfast., Disp: , Rfl:  .  loratadine (CLARITIN) 10 MG tablet, Take 10 mg by mouth daily as needed for allergies., Disp: , Rfl:  .  meclizine (ANTIVERT) 25 MG tablet, Take 25 mg by mouth 4 (four) times daily as needed for dizziness., Disp: , Rfl:  .  montelukast (SINGULAIR) 10 MG tablet, Take 1 tablet (10 mg total) by mouth at bedtime., Disp: 90 tablet, Rfl: 3 .  Multiple Vitamins-Minerals (MULTIVITAMIN WITH  MINERALS) tablet, Take 1 tablet by mouth daily., Disp: , Rfl:  .  olmesartan-hydrochlorothiazide (BENICAR HCT) 40-25 MG per tablet, Take 1 tablet by mouth daily., Disp: , Rfl:  .  Omega-3 Fatty Acids (FISH  OIL PO), Take 1 capsule by mouth 2 (two) times daily., Disp: , Rfl:  .  pantoprazole (PROTONIX) 40 MG tablet, Take 40 mg by mouth daily., Disp: , Rfl:  .  potassium chloride (MICRO-K) 10 MEQ CR capsule, Take 30 mEq by mouth 2 (two) times daily., Disp: , Rfl:  .  pyridOXINE (B-6) 50 MG tablet, Take 50 mg by mouth daily., Disp: , Rfl:  .  simvastatin (ZOCOR) 20 MG tablet, Take 20 mg by mouth daily., Disp: , Rfl:  .  Spacer/Aero-Holding Chambers (AEROCHAMBER MV) inhaler, Use as instructed, Disp: 1 each, Rfl: 0 .  torsemide (DEMADEX) 20 MG tablet, Take 20 mg by mouth as needed. , Disp: , Rfl:

## 2018-04-22 NOTE — Addendum Note (Signed)
Addended by: Len Blalock on: 04/22/2018 04:19 PM   Modules accepted: Orders

## 2018-04-22 NOTE — Addendum Note (Signed)
Addended by: Len Blalock on: 04/22/2018 04:09 PM   Modules accepted: Orders

## 2018-04-22 NOTE — Patient Instructions (Signed)
Obstructive sleep apnea Keep using CPAP nightly Allergic rhinitis: Continue Claritin daily Continue Flonase daily Continue montelukast daily  Severe persistent asthma with recent recurrent exacerbations: I am concerned that there is an allergy that may be driving this Stop taking prednisone after tomorrow's dose Come see me in the [place] office next week and we will check blood work: Serum IgE, serum CBC with differential to look for eosinophils (allergy cells that can make your asthma worse) Continue Symbicort 2 puffs twice a day, take it with a spacer  Gastroesophageal reflux disease: Poorly controlled gastroesophageal reflux disease can make asthma control worse Try taking Protonix twice a day for the next 7 days  We will see you back in 1 week at the [place] office or sooner if needed

## 2018-04-22 NOTE — Addendum Note (Signed)
Addended by: Len Blalock on: 04/22/2018 04:11 PM   Modules accepted: Orders

## 2018-04-23 ENCOUNTER — Telehealth: Payer: Self-pay | Admitting: Pulmonary Disease

## 2018-04-23 ENCOUNTER — Ambulatory Visit (INDEPENDENT_AMBULATORY_CARE_PROVIDER_SITE_OTHER): Payer: Medicare PPO | Admitting: Pulmonary Disease

## 2018-04-23 DIAGNOSIS — R0600 Dyspnea, unspecified: Secondary | ICD-10-CM | POA: Diagnosis not present

## 2018-04-23 LAB — PULMONARY FUNCTION TEST
DL/VA % pred: 118 %
DL/VA: 5.89 ml/min/mmHg/L
DLCO unc % pred: 69 %
DLCO unc: 18.28 ml/min/mmHg
FEF 25-75 Post: 1.14 L/sec
FEF 25-75 Pre: 1.29 L/sec
FEF2575-%Change-Post: -11 %
FEF2575-%Pred-Post: 57 %
FEF2575-%Pred-Pre: 64 %
FEV1-%Change-Post: -3 %
FEV1-%Pred-Post: 74 %
FEV1-%Pred-Pre: 76 %
FEV1-Post: 1.56 L
FEV1-Pre: 1.61 L
FEV1FVC-%Change-Post: 2 %
FEV1FVC-%Pred-Pre: 98 %
FEV6-%Change-Post: -5 %
FEV6-%Pred-Post: 76 %
FEV6-%Pred-Pre: 80 %
FEV6-Post: 1.98 L
FEV6-Pre: 2.09 L
FEV6FVC-%Change-Post: 0 %
FEV6FVC-%Pred-Post: 103 %
FEV6FVC-%Pred-Pre: 103 %
FVC-%Change-Post: -5 %
FVC-%Pred-Post: 73 %
FVC-%Pred-Pre: 77 %
FVC-Post: 1.99 L
FVC-Pre: 2.09 L
Post FEV1/FVC ratio: 78 %
Post FEV6/FVC ratio: 100 %
Pre FEV1/FVC ratio: 77 %
Pre FEV6/FVC Ratio: 100 %
RV % pred: 110 %
RV: 2.39 L
TLC % pred: 87 %
TLC: 4.63 L

## 2018-04-23 MED ORDER — BUDESONIDE-FORMOTEROL FUMARATE 160-4.5 MCG/ACT IN AERO: 2.0000 | INHALATION_SPRAY | Freq: Two times a day (BID) | RESPIRATORY_TRACT | 0 refills | Status: DC

## 2018-04-23 NOTE — Telephone Encounter (Signed)
Pt in office today for PFT June Leap CMA provided patient with #1 sample Symbicort 160

## 2018-04-23 NOTE — Progress Notes (Signed)
PFT done today. 

## 2018-04-24 ENCOUNTER — Telehealth: Payer: Self-pay | Admitting: Pulmonary Disease

## 2018-04-24 DIAGNOSIS — R011 Cardiac murmur, unspecified: Secondary | ICD-10-CM

## 2018-04-24 DIAGNOSIS — J984 Other disorders of lung: Secondary | ICD-10-CM

## 2018-04-24 NOTE — Progress Notes (Signed)
LMTCB on both cell and home numbers

## 2018-04-24 NOTE — Telephone Encounter (Signed)
Notes recorded by Rosana Berger, CMA on 04/24/2018 at 4:43 PM EDT LMTCB on both cell and home numbers ------  Notes recorded by Juanito Doom, MD on 04/24/2018 at 9:26 AM EDT BJ, Please let the patient know this showed mild enlargement of her heart, I'd like her to have a BNP blood test in addition to the other blood work I suggested. Ideally if she can have this on Monday before I see her on Tuesday that would be great. Thanks, B

## 2018-04-27 ENCOUNTER — Other Ambulatory Visit: Payer: Self-pay | Admitting: Pulmonary Disease

## 2018-04-27 ENCOUNTER — Telehealth: Payer: Self-pay | Admitting: Pulmonary Disease

## 2018-04-27 ENCOUNTER — Other Ambulatory Visit (HOSPITAL_COMMUNITY)
Admission: RE | Admit: 2018-04-27 | Discharge: 2018-04-27 | Disposition: A | Discharge status: Home / Self Care | Payer: Medicare PPO | Source: Ambulatory Visit | Attending: Pulmonary Disease | Admitting: Pulmonary Disease

## 2018-04-27 DIAGNOSIS — J984 Other disorders of lung: Secondary | ICD-10-CM

## 2018-04-27 DIAGNOSIS — R011 Cardiac murmur, unspecified: Secondary | ICD-10-CM

## 2018-04-27 LAB — CBC WITH DIFFERENTIAL/PLATELET
Abs Immature Granulocytes: 0.09 10*3/uL — ABNORMAL HIGH (ref 0.00–0.07)
Basophils Absolute: 0.1 10*3/uL (ref 0.0–0.1)
Basophils Relative: 1 %
Eosinophils Absolute: 0.2 10*3/uL (ref 0.0–0.5)
Eosinophils Relative: 2 %
HCT: 38.6 % (ref 36.0–46.0)
Hemoglobin: 12.3 g/dL (ref 12.0–15.0)
Immature Granulocytes: 1 %
Lymphocytes Relative: 31 %
Lymphs Abs: 3.3 10*3/uL (ref 0.7–4.0)
MCH: 30.6 pg (ref 26.0–34.0)
MCHC: 31.9 g/dL (ref 30.0–36.0)
MCV: 96 fL (ref 80.0–100.0)
Monocytes Absolute: 1 10*3/uL (ref 0.1–1.0)
Monocytes Relative: 10 %
Neutro Abs: 5.7 10*3/uL (ref 1.7–7.7)
Neutrophils Relative %: 55 %
Platelets: 437 10*3/uL — ABNORMAL HIGH (ref 150–400)
RBC: 4.02 MIL/uL (ref 3.87–5.11)
RDW: 15.6 % — ABNORMAL HIGH (ref 11.5–15.5)
WBC: 10.4 10*3/uL (ref 4.0–10.5)
nRBC: 0 % (ref 0.0–0.2)

## 2018-04-27 LAB — BRAIN NATRIURETIC PEPTIDE: B Natriuretic Peptide: 44 pg/mL (ref 0.0–100.0)

## 2018-04-27 NOTE — Telephone Encounter (Signed)
See 11/1 phone note- this was discussed on the phone with pt this morning.

## 2018-04-27 NOTE — Telephone Encounter (Signed)
Called and spoke with the lab from [facility], they needed labs changed

## 2018-04-27 NOTE — Telephone Encounter (Signed)
Patient returned phone call.  Phone number is [phone].

## 2018-04-27 NOTE — Telephone Encounter (Signed)
Labs have been changed, nothing further needed.

## 2018-04-27 NOTE — Telephone Encounter (Signed)
Called and spoke with patient, she is aware of results and verbalized understanding. I have ordered lab work to be done at [facility] today so that it will be ready to go over tomorrow at patients visit. Patient will go have lab work done today. Nothing further needed.

## 2018-04-28 ENCOUNTER — Ambulatory Visit: Payer: Medicare PPO | Admitting: Pulmonary Disease

## 2018-04-28 ENCOUNTER — Encounter: Payer: Self-pay | Admitting: Pulmonary Disease

## 2018-04-28 VITALS — BP 130/76 | HR 80 | Ht 66.0 in | Wt 312.0 lb

## 2018-04-28 DIAGNOSIS — Z9989 Dependence on other enabling machines and devices: Secondary | ICD-10-CM

## 2018-04-28 DIAGNOSIS — J4540 Moderate persistent asthma, uncomplicated: Secondary | ICD-10-CM

## 2018-04-28 DIAGNOSIS — G4733 Obstructive sleep apnea (adult) (pediatric): Secondary | ICD-10-CM | POA: Diagnosis not present

## 2018-04-28 NOTE — Progress Notes (Addendum)
Synopsis: Former patient of Dr. Ashok Daugherty who has obstructive sleep apnea, moderate persistent asthma, allergic rhinitis, and gastroesophageal reflux disease.  Subjective:   PATIENT ID: Tamara Daugherty GENDER: female DOB: 12/21/1950, MRN: [id number]   HPI  Chief Complaint  Patient presents with  . Follow-up    Per patient, she is here to discuss test results. She had her labs drawn yesterday and her PFT on 10/31.     Tamara Daugherty is here to go over her blood work and follow up regarding her severe persistent asthma with recurrent exacerbations.  She says that she still has a little bit of shortness of breath but she is not wheezing and she is not coughing.  She continues to take her Symbicort twice a day.  She is compliant with her Flonase, montelukast and antihistamine. Still has some dyspnea No wheezing Still has a little cough compared to before  Past Medical History:  Diagnosis Date  . Anemia    takes Iron daily  . Anxiety   . Arthritis   . Asthma    Symbicort daily and Albuterol daily as needed  . Bruises easily    d/t being on Plavix   . Complication of anesthesia    long time to wake up - referred to pulmonary after carpel tunnel procedure - for asthma and undiagnosed sleep apnea  . GERD (gastroesophageal reflux disease)    takes Protonix daily  . Headache   . Heart murmur    [facility] - [place] [facility]  . History of blood transfusion    no abnormal reaction noted  . History of colon polyps    beningn  . History of migraine    couple of wks ago was the last one  . Hyperlipidemia    takes Atorvastatin daily  . Hypertension    takes Benicar and Diltiazem daily  . Hypothyroidism    takes Synthroid daily  . Itching    takes Atarax daily as needed  . Joint pain   . Joint swelling   . Multiple allergies    takes Claritin daily as needed as well as using Flonase if needed  . Muscle spasm    takes Baclofen if needed  . Peripheral edema    takes Torsemide daily  .  Pneumonia    hx of  . Primary localized osteoarthritis of left knee 06/21/2014  . Primary localized osteoarthritis of right knee 03/21/2015  . Sleep apnea    cpap  . TIA (transient ischemic attack)    takes Plavix daily;notices right side is slightly weaker than left  . Vertigo    takes Meclizine daily as needed       Review of Systems  Constitutional: Positive for malaise/fatigue. Negative for chills, diaphoresis and fever.  HENT: Positive for congestion. Negative for ear discharge and nosebleeds.   Respiratory: Positive for cough, shortness of breath and wheezing. Negative for sputum production.       Objective:  Physical Exam   Vitals:   04/28/18 1118  BP: 130/76  Pulse: 80  SpO2: 97%  Weight: (!) 312 lb (141.5 kg)  Height: 5\' 6"  (1.676 m)    Gen: well appearing HENT: OP clear, TM's clear, neck supple PULM: CTA B, normal percussion CV: RRR, no mgr, trace edema GI: BS+, soft, nontender Derm: no cyanosis or rash Psyche: normal mood and affect   CBC    Component Value Date/Time   WBC 10.4 04/27/2018 1158   RBC 4.02 04/27/2018 1158   HGB 12.3  04/27/2018 1158   HCT 38.6 04/27/2018 1158   PLT 437 (H) 04/27/2018 1158   MCV 96.0 04/27/2018 1158   MCH 30.6 04/27/2018 1158   MCHC 31.9 04/27/2018 1158   RDW 15.6 (H) 04/27/2018 1158   LYMPHSABS 3.3 04/27/2018 1158   MONOABS 1.0 04/27/2018 1158   EOSABS 0.2 04/27/2018 1158   BASOSABS 0.1 04/27/2018 1158     PFT April 23, 2018 pulmonary function testing normal ratio, FVC 2.1L 77% predicted, total lung capacity 4.6 L 87% predicted, DLCO 18.3 mL 69% predicted 01/09/17: FVC 2.24 L (80%) FEV1 1.67 L (76%) FEV1/FVC 0.75 FEF 25-75 1.25 L (60%) negative bronchodilator response DLCO corrected 62% 09/27/16: FVC 1.92 L (69%) FEV1 1.51 L (69%) FEV1/FVC 0.79 FEF 25-75 1.37 L (66%) negative bronchodilator response TLC 3.46 L (64%) RV 71% ERV 22% DLCO corrected 66%  FENO: 04/22/2018 20 ppb   6MWT 04/21/17:  Walked 318  meters / Baseline Sat 100% on RA / Nadir Sat 95% on RA 01/03/17:  Walked 446 meters / Baseline Sat 98% on RA / Nadir Sat 87% on RA @ 5:50 (required 2 L/m with exertion to maintain) 09/27/16:  Walked 318 meters / Baseline Sat 100% on RA / Nadir Sat 93% on RA (rested for 1 min)  IMAGING HRCT CHEST W/O 01/16/17:  Mild dilation of the pulmonic trunk measuring 4.1 cm. Mild tracheobronchomalacia. Cardiomegaly noted. No bronchiectasis. No reticulation, intralobular septal thickening, or medical changes to suggest interstitial lung disease. No parenchymal mass or opacification. No pleural effusion or thickening. No pericardial effusion. No pathologic mediastinal adenopathy.   CXR PA/LAT 08/30/16 :  Borderline mild cardiomegaly. Low lung volumes. No focal opacity or effusion appreciated. Mediastinum normal in contour.  CARDIAC TTE (01/16/17):  LV moderately dilated with EF 55-60%. No regional wall motion abnormalities. LA & RA normal in size. RV normal in size and function. No aortic stenosis or regurgitation. Aortic root normal in size. Mild mitral regurgitation without stenosis. Trivial pulmonic regurgitation without stenosis. Mild tricuspid regurgitation. No pericardial effusion.  LABS  April 27, 2018: BNP 44, hemoglobin 12.3, absolute eosinophil 200  08/30/16 CBC: 8.2/12.1/36.3/360 Eosinophils: 0.3 IgE: 121 RAST panel: Marginal/week positives       Assessment & Plan:   OSA on CPAP  Moderate persistent asthma without complication  Discussion: Blood work did not reveal evidence of an underlying anemia or heart problem, her absolute eosinophil count was 200 yesterday so not extremely elevated.  However, last year she had an eosinophil count of 300 cells.  She has recurrent exacerbations of severe persistent asthma with an eosinophilic phenotype.  Today we talked about different treatment options and I told her that I feel that both immunotherapy or treatment with a biologic agent for asthma could  be equally effective.  We talked about the pros and cons of each at length.  She is yet to decide which form she would like to go with.  Plan: Severe persistent asthma with eosinophilia and recurrent exacerbations: Continue Symbicort Continue montelukast Continue using fluticasone nose spray I recommend either seeing an allergist for immunotherapy or starting on a medicine called Dupixent. After thinking about this let us know what direction you would like to go in. Use albuterol nebulized every 4-6 hours as needed for shortness of breath when at home.  I will plan on seeing you back in 2 months or sooner if needed  > 50% of this 25 minute visit was spent face to face   Current Outpatient Medications:  .  albuterol (PROVENTIL HFA;VENTOLIN HFA) 108 (90 Base) MCG/ACT inhaler, Inhale 2 puffs into the lungs every 6 (six) hours as needed for wheezing or shortness of breath., Disp: 3 Inhaler, Rfl: 0 .  budesonide-formoterol (SYMBICORT) 160-4.5 MCG/ACT inhaler, Inhale 2 puffs into the lungs 2 (two) times daily., Disp: 1 Inhaler, Rfl: 0 .  budesonide-formoterol (SYMBICORT) 160-4.5 MCG/ACT inhaler, Inhale 2 puffs into the lungs 2 (two) times daily., Disp: 2 Inhaler, Rfl: 0 .  budesonide-formoterol (SYMBICORT) 160-4.5 MCG/ACT inhaler, Inhale 2 puffs into the lungs 2 (two) times daily., Disp: 1 Inhaler, Rfl: 0 .  CALCIUM-VITAMIN D PO, Take 1 tablet by mouth daily., Disp: , Rfl:  .  clopidogrel (PLAVIX) 75 MG tablet, Take 1 tablet by mouth daily., Disp: , Rfl:  .  diltiazem (MATZIM LA) 240 MG 24 hr tablet, Take 240 mg by mouth daily., Disp: , Rfl:  .  EPINEPHrine 0.3 mg/0.3 mL IJ SOAJ injection, Inject 0.3 mg into the muscle once., Disp: , Rfl:  .  fluticasone (FLONASE) 50 MCG/ACT nasal spray, Place 1 spray into both nostrils 2 (two) times daily as needed for allergies or rhinitis., Disp: , Rfl:  .  guaiFENesin (MUCINEX) 600 MG 12 hr tablet, Take 600 mg by mouth 2 (two) times daily as needed for to  loosen phlegm., Disp: , Rfl:  .  guaiFENesin-codeine (ROBITUSSIN AC) 100-10 MG/5ML syrup, Take 10 mLs by mouth 4 (four) times daily as needed for cough., Disp: , Rfl:  .  hydrOXYzine (ATARAX/VISTARIL) 25 MG tablet, Take 25 mg by mouth every 8 (eight) hours as needed for itching., Disp: , Rfl:  .  levothyroxine (SYNTHROID, LEVOTHROID) 50 MCG tablet, Take 50 mcg by mouth daily before breakfast., Disp: , Rfl:  .  loratadine (CLARITIN) 10 MG tablet, Take 10 mg by mouth daily as needed for allergies., Disp: , Rfl:  .  meclizine (ANTIVERT) 25 MG tablet, Take 25 mg by mouth 4 (four) times daily as needed for dizziness., Disp: , Rfl:  .  montelukast (SINGULAIR) 10 MG tablet, Take 1 tablet (10 mg total) by mouth at bedtime., Disp: 90 tablet, Rfl: 3 .  Multiple Vitamins-Minerals (MULTIVITAMIN WITH MINERALS) tablet, Take 1 tablet by mouth daily., Disp: , Rfl:  .  olmesartan-hydrochlorothiazide (BENICAR HCT) 40-25 MG per tablet, Take 1 tablet by mouth daily., Disp: , Rfl:  .  Omega-3 Fatty Acids (FISH OIL PO), Take 1 capsule by mouth 2 (two) times daily., Disp: , Rfl:  .  pantoprazole (PROTONIX) 40 MG tablet, Take 40 mg by mouth daily., Disp: , Rfl:  .  potassium chloride (MICRO-K) 10 MEQ CR capsule, Take 30 mEq by mouth 2 (two) times daily., Disp: , Rfl:  .  pyridOXINE (B-6) 50 MG tablet, Take 50 mg by mouth daily., Disp: , Rfl:  .  simvastatin (ZOCOR) 20 MG tablet, Take 20 mg by mouth daily., Disp: , Rfl:  .  Spacer/Aero-Holding Chambers (AEROCHAMBER MV) inhaler, Use as instructed, Disp: 1 each, Rfl: 0 .  torsemide (DEMADEX) 20 MG tablet, Take 20 mg by mouth as needed. , Disp: , Rfl:

## 2018-04-28 NOTE — Patient Instructions (Signed)
Severe persistent asthma with eosinophilia and recurrent exacerbations: Continue Symbicort Continue montelukast Continue using fluticasone nose spray I recommend either seeing an allergist for immunotherapy or starting on a medicine called Dupixent. After thinking about this let us know what direction you would like to go in.  I will plan on seeing you back in 2 months or sooner if needed

## 2018-04-29 LAB — IGE: IgE (Immunoglobulin E), Serum: 107 IU/mL (ref 6–495)

## 2018-05-06 ENCOUNTER — Telehealth: Payer: Self-pay | Admitting: Pulmonary Disease

## 2018-05-06 NOTE — Telephone Encounter (Signed)
Spoke with patient-increased wheezing and cough-clear in color. Last time she felt this way it developed into asthma flare/bronchitis. Pt has not taken any of her nebulizer medications today and decided to go with seeing an allergist and not start Qu[facility].   Offered apt to see NP today pt declined stating she would try her breathing tx's and call is no better.  Pt did however make a sooner apt with BQ in our [facility] office on 05/12/18 to discuss further options. Nothing more needed at this time.

## 2018-05-08 ENCOUNTER — Ambulatory Visit: Payer: Medicare PPO | Admitting: Pulmonary Disease

## 2018-05-08 ENCOUNTER — Telehealth: Payer: Self-pay | Admitting: Pulmonary Disease

## 2018-05-08 DIAGNOSIS — J4540 Moderate persistent asthma, uncomplicated: Secondary | ICD-10-CM

## 2018-05-08 NOTE — Telephone Encounter (Signed)
Spoke with pt. She needs a new nebulizer machine, her current one is broken. Order has been placed. Nothing further was needed.

## 2018-05-12 ENCOUNTER — Ambulatory Visit: Payer: Medicare PPO | Admitting: Pulmonary Disease

## 2018-06-29 DIAGNOSIS — J208 Acute bronchitis due to other specified organisms: Secondary | ICD-10-CM | POA: Diagnosis not present

## 2018-06-29 DIAGNOSIS — J4541 Moderate persistent asthma with (acute) exacerbation: Secondary | ICD-10-CM | POA: Diagnosis not present

## 2018-07-09 ENCOUNTER — Ambulatory Visit (INDEPENDENT_AMBULATORY_CARE_PROVIDER_SITE_OTHER): Payer: Medicare Other | Admitting: Pulmonary Disease

## 2018-07-09 ENCOUNTER — Encounter: Payer: Self-pay | Admitting: Pulmonary Disease

## 2018-07-09 VITALS — BP 146/88 | HR 85 | Ht 66.0 in | Wt 313.0 lb

## 2018-07-09 DIAGNOSIS — G4733 Obstructive sleep apnea (adult) (pediatric): Secondary | ICD-10-CM | POA: Diagnosis not present

## 2018-07-09 DIAGNOSIS — Z9989 Dependence on other enabling machines and devices: Secondary | ICD-10-CM | POA: Diagnosis not present

## 2018-07-09 DIAGNOSIS — J4550 Severe persistent asthma, uncomplicated: Secondary | ICD-10-CM | POA: Diagnosis not present

## 2018-07-09 MED ORDER — BUDESONIDE-FORMOTEROL FUMARATE 160-4.5 MCG/ACT IN AERO: 2.0000 | INHALATION_SPRAY | Freq: Two times a day (BID) | RESPIRATORY_TRACT | 5 refills | Status: DC

## 2018-07-09 NOTE — Addendum Note (Signed)
Addended by: Len Blalock on: 07/09/2018 02:59 PM   Modules accepted: Orders

## 2018-07-09 NOTE — Progress Notes (Signed)
Synopsis: Former patient of Dr. Ashok Cordia who has obstructive sleep apnea, moderate persistent asthma, allergic rhinitis, and gastroesophageal reflux disease.  Subjective:   PATIENT ID: Tamara Daugherty GENDER: female DOB: August 21, 1950, MRN: [id number]   HPI  Chief Complaint  Patient presents with  . Follow-up    wants to discuss treatments for allergies.  pt states she is stable while on prednisone.     [name] was doing great until about a week after christmas, but then she started wheezing again and had more short of breath.  She went to her PCP and was prescribed an injection of a corticosteroid and was started on prednisone.  She says that her breathing has improved significantly since doing that.  She feels "great right now ".  She is still taking her inhaled medicines.  Past Medical History:  Diagnosis Date  . Anemia    takes Iron daily  . Anxiety   . Arthritis   . Asthma    Symbicort daily and Albuterol daily as needed  . Bruises easily    d/t being on Plavix   . Complication of anesthesia    long time to wake up - referred to pulmonary after carpel tunnel procedure - for asthma and undiagnosed sleep apnea  . GERD (gastroesophageal reflux disease)    takes Protonix daily  . Headache   . Heart murmur    [facility] - [place] [facility]  . History of blood transfusion    no abnormal reaction noted  . History of colon polyps    beningn  . History of migraine    couple of wks ago was the last one  . Hyperlipidemia    takes Atorvastatin daily  . Hypertension    takes Benicar and Diltiazem daily  . Hypothyroidism    takes Synthroid daily  . Itching    takes Atarax daily as needed  . Joint pain   . Joint swelling   . Multiple allergies    takes Claritin daily as needed as well as using Flonase if needed  . Muscle spasm    takes Baclofen if needed  . Peripheral edema    takes Torsemide daily  . Pneumonia    hx of  . Primary localized osteoarthritis of left knee 06/21/2014    . Primary localized osteoarthritis of right knee 03/21/2015  . Sleep apnea    cpap  . TIA (transient ischemic attack)    takes Plavix daily;notices right side is slightly weaker than left  . Vertigo    takes Meclizine daily as needed       Review of Systems  Constitutional: Positive for malaise/fatigue. Negative for chills, diaphoresis and fever.  HENT: Positive for congestion. Negative for ear discharge and nosebleeds.   Respiratory: Positive for cough, shortness of breath and wheezing. Negative for sputum production.       Objective:  Physical Exam   Vitals:   07/09/18 1414  BP: (!) 146/88  Pulse: 85  SpO2: 96%  Weight: (!) 313 lb (142 kg)  Height: 5\' 6"  (1.676 m)    Gen: well appearing HENT: OP clear, TM's clear, neck supple PULM: CTA B, normal percussion CV: RRR, no mgr, trace edema GI: BS+, soft, nontender Derm: no cyanosis or rash Psyche: normal mood and affect    CBC    Component Value Date/Time   WBC 10.4 04/27/2018 1158   RBC 4.02 04/27/2018 1158   HGB 12.3 04/27/2018 1158   HCT 38.6 04/27/2018 1158   PLT 437 (  H) 04/27/2018 1158   MCV 96.0 04/27/2018 1158   MCH 30.6 04/27/2018 1158   MCHC 31.9 04/27/2018 1158   RDW 15.6 (H) 04/27/2018 1158   LYMPHSABS 3.3 04/27/2018 1158   MONOABS 1.0 04/27/2018 1158   EOSABS 0.2 04/27/2018 1158   BASOSABS 0.1 04/27/2018 1158     PFT April 23, 2018 pulmonary function testing normal ratio, FVC 2.1L 77% predicted, total lung capacity 4.6 L 87% predicted, DLCO 18.3 mL 69% predicted 01/09/17: FVC 2.24 L (80%) FEV1 1.67 L (76%) FEV1/FVC 0.75 FEF 25-75 1.25 L (60%) negative bronchodilator response DLCO corrected 62% 09/27/16: FVC 1.92 L (69%) FEV1 1.51 L (69%) FEV1/FVC 0.79 FEF 25-75 1.37 L (66%) negative bronchodilator response TLC 3.46 L (64%) RV 71% ERV 22% DLCO corrected 66%  FENO: 04/22/2018 20 ppb   6MWT 04/21/17:  Walked 318 meters / Baseline Sat 100% on RA / Nadir Sat 95% on RA 01/03/17:  Walked 446  meters / Baseline Sat 98% on RA / Nadir Sat 87% on RA @ 5:50 (required 2 L/m with exertion to maintain) 09/27/16:  Walked 318 meters / Baseline Sat 100% on RA / Nadir Sat 93% on RA (rested for 1 min)  IMAGING HRCT CHEST W/O 01/16/17:  Mild dilation of the pulmonic trunk measuring 4.1 cm. Mild tracheobronchomalacia. Cardiomegaly noted. No bronchiectasis. No reticulation, intralobular septal thickening, or medical changes to suggest interstitial lung disease. No parenchymal mass or opacification. No pleural effusion or thickening. No pericardial effusion. No pathologic mediastinal adenopathy.   CXR PA/LAT 08/30/16 :  Borderline mild cardiomegaly. Low lung volumes. No focal opacity or effusion appreciated. Mediastinum normal in contour.  CARDIAC TTE (01/16/17):  LV moderately dilated with EF 55-60%. No regional wall motion abnormalities. LA & RA normal in size. RV normal in size and function. No aortic stenosis or regurgitation. Aortic root normal in size. Mild mitral regurgitation without stenosis. Trivial pulmonic regurgitation without stenosis. Mild tricuspid regurgitation. No pericardial effusion.  LABS  April 27, 2018: BNP 44, hemoglobin 12.3, absolute eosinophil 200  08/30/16 CBC: 8.2/12.1/36.3/360 Eosinophils: 0.3 IgE: 121 RAST panel: Marginal/week positives       Assessment & Plan:   OSA on CPAP  Severe persistent allergic asthma  Discussion: I remain concerned about [name] because of her severe persistent asthma with recurrent exacerbations.  She has now had at least 3 exacerbations over the last 4 to 6 months, 1 of which was severe requiring hospitalization.  She also has evidence of eosinophilia based on blood work performed in the past.  We have given her ample time to see if the asthma will just pass on its own and unfortunately it has not.  I do not think there is another concomitant lung condition leading to these intermittent spells.  Certainly her obesity both contributes to her  poor asthma control and overall symptoms but when she is on prednisone she has essentially no shortness of breath or other respiratory complaints.  So I think we need to move forward with an injectable biologic treatment, specifically dupilumab every 14 days.  Plan: Severe persistent asthma with eosinophilia and recurrent exacerbations: Continue Symbicort 2 puffs twice a day no matter how you feel Continue Singulair daily Start taking Dupilumab, we will start the paperwork for this and we will make arrangements for a sample injection to be given next Friday and again 2 weeks after that.  Please call our office by Thursday, January 23 to confirm that you have the appointment for the sample injection on January  24 and to confirm the time.  We will see you back in 2 months or sooner if needed  > 50% of thi 25 min visit spent face to face   Current Outpatient Medications:  .  albuterol (PROVENTIL HFA;VENTOLIN HFA) 108 (90 Base) MCG/ACT inhaler, Inhale 2 puffs into the lungs every 6 (six) hours as needed for wheezing or shortness of breath., Disp: 3 Inhaler, Rfl: 0 .  budesonide-formoterol (SYMBICORT) 160-4.5 MCG/ACT inhaler, Inhale 2 puffs into the lungs 2 (two) times daily., Disp: 2 Inhaler, Rfl: 0 .  CALCIUM-VITAMIN D PO, Take 1 tablet by mouth daily., Disp: , Rfl:  .  clopidogrel (PLAVIX) 75 MG tablet, Take 1 tablet by mouth daily., Disp: , Rfl:  .  diltiazem (MATZIM LA) 240 MG 24 hr tablet, Take 240 mg by mouth daily., Disp: , Rfl:  .  EPINEPHrine 0.3 mg/0.3 mL IJ SOAJ injection, Inject 0.3 mg into the muscle once., Disp: , Rfl:  .  fluticasone (FLONASE) 50 MCG/ACT nasal spray, Place 1 spray into both nostrils 2 (two) times daily as needed for allergies or rhinitis., Disp: , Rfl:  .  guaiFENesin (MUCINEX) 600 MG 12 hr tablet, Take 600 mg by mouth 2 (two) times daily as needed for to loosen phlegm., Disp: , Rfl:  .  guaiFENesin-codeine (ROBITUSSIN AC) 100-10 MG/5ML syrup, Take 10 mLs by mouth 4  (four) times daily as needed for cough., Disp: , Rfl:  .  hydrOXYzine (ATARAX/VISTARIL) 25 MG tablet, Take 25 mg by mouth every 8 (eight) hours as needed for itching., Disp: , Rfl:  .  levothyroxine (SYNTHROID, LEVOTHROID) 50 MCG tablet, Take 50 mcg by mouth daily before breakfast., Disp: , Rfl:  .  loratadine (CLARITIN) 10 MG tablet, Take 10 mg by mouth daily as needed for allergies., Disp: , Rfl:  .  meclizine (ANTIVERT) 25 MG tablet, Take 25 mg by mouth 4 (four) times daily as needed for dizziness., Disp: , Rfl:  .  montelukast (SINGULAIR) 10 MG tablet, Take 1 tablet (10 mg total) by mouth at bedtime., Disp: 90 tablet, Rfl: 3 .  Multiple Vitamins-Minerals (MULTIVITAMIN WITH MINERALS) tablet, Take 1 tablet by mouth daily., Disp: , Rfl:  .  olmesartan-hydrochlorothiazide (BENICAR HCT) 40-25 MG per tablet, Take 1 tablet by mouth daily., Disp: , Rfl:  .  Omega-3 Fatty Acids (FISH OIL PO), Take 1 capsule by mouth 2 (two) times daily., Disp: , Rfl:  .  pantoprazole (PROTONIX) 40 MG tablet, Take 40 mg by mouth daily., Disp: , Rfl:  .  potassium chloride (MICRO-K) 10 MEQ CR capsule, Take 30 mEq by mouth daily. , Disp: , Rfl:  .  pyridOXINE (B-6) 50 MG tablet, Take 50 mg by mouth daily., Disp: , Rfl:  .  simvastatin (ZOCOR) 20 MG tablet, Take 20 mg by mouth daily., Disp: , Rfl:  .  Spacer/Aero-Holding Chambers (AEROCHAMBER MV) inhaler, Use as instructed, Disp: 1 each, Rfl: 0 .  torsemide (DEMADEX) 20 MG tablet, Take 20 mg by mouth as needed. , Disp: , Rfl:  .  TURMERIC PO, Take 1 capsule by mouth daily., Disp: , Rfl:  .  predniSONE (DELTASONE) 10 MG tablet, Take 1 tablet by mouth daily., Disp: , Rfl:

## 2018-07-09 NOTE — Patient Instructions (Signed)
Severe persistent asthma with eosinophilia and recurrent exacerbations: Continue Symbicort 2 puffs twice a day no matter how you feel Continue Singulair daily Start taking Dupilumab, we will start the paperwork for this and we will make arrangements for a sample injection to be given next Friday and again 2 weeks after that.  Please call our office by Thursday, January 23 to confirm that you have the appointment for the sample injection on January 24 and to confirm the time.  We will see you back in 2 months or sooner if needed

## 2018-07-10 ENCOUNTER — Telehealth: Payer: Self-pay

## 2018-07-10 MED ORDER — BUDESONIDE-FORMOTEROL FUMARATE 160-4.5 MCG/ACT IN AERO: 2.0000 | INHALATION_SPRAY | Freq: Two times a day (BID) | RESPIRATORY_TRACT | 0 refills | Status: DC

## 2018-07-10 NOTE — Telephone Encounter (Signed)
Patient is being started on Dupixent per BQ's last office visit.  Pt will be started on 07/16/2017 with a sample of Dupixent. Pt aware that she needs to have Epi-Pen with her in order to receive injection.  She verified that she has one on hand and does not need a new rx at this time.  Pt aware of 2-hr wait after injection.    Pt also asked that symbicort samples be placed up front for pickup.  These have been placed.    Forwarding to [employer] to make aware.  Nothing further needed at this time.

## 2018-07-14 ENCOUNTER — Telehealth: Payer: Self-pay | Admitting: Pulmonary Disease

## 2018-07-14 NOTE — Telephone Encounter (Signed)
Per my last telephone note, [place] application was faxed on 1/17.  I called to check status of application today, and rep advised that they did not have patient in their system.  refaxed application and received a fax confirmation sheet.    Will route to [employer] to continue following up on.

## 2018-07-14 NOTE — Telephone Encounter (Signed)
Waiting on summary of benefits and wether or a P/A is needed.

## 2018-07-16 ENCOUNTER — Ambulatory Visit: Payer: PRIVATE HEALTH INSURANCE

## 2018-07-17 ENCOUNTER — Ambulatory Visit (INDEPENDENT_AMBULATORY_CARE_PROVIDER_SITE_OTHER): Payer: Medicare Other

## 2018-07-17 DIAGNOSIS — J4550 Severe persistent asthma, uncomplicated: Secondary | ICD-10-CM

## 2018-07-17 MED ORDER — DUPILUMAB 300 MG/2ML ~~LOC~~ SOSY
600.0000 mg | PREFILLED_SYRINGE | Freq: Once | SUBCUTANEOUS | Status: AC
  Administered 2018-07-17: 600 mg via SUBCUTANEOUS

## 2018-07-17 NOTE — Progress Notes (Signed)
Patient answered the following questions per the hand-written documentation on the Dupixent Injection sheet:  Have you had a change in your insurance?  no. Have you been in the hospital in the past 10 days?  no. Do you have a fever?  no. Do you have a cough?  Yes, drainage, clear.

## 2018-07-20 NOTE — Telephone Encounter (Signed)
A fax from [facility] requesting additional info regarding pt's PA has been received.  Filled out completely and faxed back to [facility]- confirmation fax received.    Routing back to [employer] for continued follow-up.

## 2018-07-20 NOTE — Telephone Encounter (Signed)
Per pt's chart, a PA is needed in order to find out how much copay assistance pt is eligible for. Initiated PA via [email or web address].  Key: [id number] A determination is expected within 3 business days.    When PA has been completed, we will need to call [employer] at [phone] to make aware and figure out patient assistance coverage.  Specialty pharmacy requested for this pt: [facility].  Phone #: [phone] Fax #: [phone].    Routing back to [employer] to follow up on.

## 2018-07-21 NOTE — Telephone Encounter (Signed)
Pt want's the Dupixent sent to [address] in [place]

## 2018-07-21 NOTE — Telephone Encounter (Signed)
[place] needs an order for [employer] [phone]

## 2018-07-21 NOTE — Telephone Encounter (Signed)
Atc couldn't get thru. Will cb 07/22/2018.

## 2018-07-22 NOTE — Telephone Encounter (Signed)
Patient calling regarding her Dupixent.  She states she has been told this is going to cost her $1,500.  She is wanting to know if she has injections at the office could this be billed through medical insurance vs through [employer] is [phone], or [phone].

## 2018-07-24 NOTE — Telephone Encounter (Signed)
Patient called once again for update on Dupixent injections.  She asked that we call her back and leave a detailed msg on her VM at [phone].

## 2018-07-24 NOTE — Telephone Encounter (Signed)
Patient states would like update on dupixent injections. Patient phone number is [phone].

## 2018-07-24 NOTE — Telephone Encounter (Signed)
Message routed to [facility].

## 2018-07-27 DIAGNOSIS — Z1231 Encounter for screening mammogram for malignant neoplasm of breast: Secondary | ICD-10-CM | POA: Diagnosis not present

## 2018-07-27 NOTE — Telephone Encounter (Signed)
Pt has apt scheduled for 07-31-2018 for next Dupixent Injection. I have sent order to [facility] through [facility] via fax  To [phone].  1 prefilled syringe Ordered Date: 07/27/18  Shipping Date: 07/30/2018

## 2018-07-27 NOTE — Telephone Encounter (Signed)
Previously routed to [employer]

## 2018-07-31 ENCOUNTER — Ambulatory Visit: Payer: PRIVATE HEALTH INSURANCE

## 2018-07-31 MED ORDER — BUDESONIDE-FORMOTEROL FUMARATE 160-4.5 MCG/ACT IN AERO: 2.0000 | INHALATION_SPRAY | Freq: Two times a day (BID) | RESPIRATORY_TRACT | 6 refills | Status: DC

## 2018-07-31 NOTE — Telephone Encounter (Signed)
Pt has apt today for Dupixent Injection-medication was not rec'd;therefore called [facility] at [phone]. I was told my fax request was not rec'd in their system and had to speak with Ander Purpura at [place]. Direct # [phone] 6:30am 8:0pm CST M-F.   1 prefilled syringe Ordered Date: 07/31/18 Shipping Date: 08/04/2018   Spoke with patient who understands the change in pharmacies and apt has been made for 08/05/2018 at 10:30am.

## 2018-07-31 NOTE — Telephone Encounter (Signed)
Spoke with pt.  Symbicort is $300 until her deductible is met.  Will leave samples at front desk for her to pick up with next injection.  Offered assistance paperwork but pt stated she had been denied due to husband's income being included too.  Nothing further needed.

## 2018-08-03 ENCOUNTER — Ambulatory Visit: Payer: PRIVATE HEALTH INSURANCE

## 2018-08-05 ENCOUNTER — Ambulatory Visit (INDEPENDENT_AMBULATORY_CARE_PROVIDER_SITE_OTHER): Payer: Medicare Other

## 2018-08-05 DIAGNOSIS — J4550 Severe persistent asthma, uncomplicated: Secondary | ICD-10-CM

## 2018-08-11 ENCOUNTER — Telehealth: Payer: Self-pay | Admitting: Pulmonary Disease

## 2018-08-11 ENCOUNTER — Telehealth: Payer: Self-pay | Admitting: Primary Care

## 2018-08-11 ENCOUNTER — Ambulatory Visit (INDEPENDENT_AMBULATORY_CARE_PROVIDER_SITE_OTHER): Payer: Medicare Other | Admitting: Primary Care

## 2018-08-11 ENCOUNTER — Ambulatory Visit (INDEPENDENT_AMBULATORY_CARE_PROVIDER_SITE_OTHER)
Admission: RE | Admit: 2018-08-11 | Discharge: 2018-08-11 | Disposition: A | Discharge status: Home / Self Care | Payer: Medicare Other | Source: Ambulatory Visit | Attending: Primary Care | Admitting: Primary Care

## 2018-08-11 ENCOUNTER — Encounter: Payer: Self-pay | Admitting: Primary Care

## 2018-08-11 VITALS — BP 148/84 | HR 93 | Ht 66.0 in | Wt 314.0 lb

## 2018-08-11 DIAGNOSIS — J181 Lobar pneumonia, unspecified organism: Secondary | ICD-10-CM

## 2018-08-11 DIAGNOSIS — J4551 Severe persistent asthma with (acute) exacerbation: Secondary | ICD-10-CM | POA: Diagnosis not present

## 2018-08-11 DIAGNOSIS — J4550 Severe persistent asthma, uncomplicated: Secondary | ICD-10-CM

## 2018-08-11 DIAGNOSIS — R05 Cough: Secondary | ICD-10-CM | POA: Diagnosis not present

## 2018-08-11 DIAGNOSIS — J189 Pneumonia, unspecified organism: Secondary | ICD-10-CM | POA: Insufficient documentation

## 2018-08-11 LAB — CBC WITH DIFFERENTIAL/PLATELET
Basophils Absolute: 0.1 10*3/uL (ref 0.0–0.1)
Basophils Relative: 0.9 % (ref 0.0–3.0)
Eosinophils Absolute: 0.3 10*3/uL (ref 0.0–0.7)
Eosinophils Relative: 3.5 % (ref 0.0–5.0)
HCT: 37.2 % (ref 36.0–46.0)
Hemoglobin: 12.1 g/dL (ref 12.0–15.0)
Lymphocytes Relative: 29.6 % (ref 12.0–46.0)
Lymphs Abs: 2.1 10*3/uL (ref 0.7–4.0)
MCHC: 32.5 g/dL (ref 30.0–36.0)
MCV: 90.8 fl (ref 78.0–100.0)
Monocytes Absolute: 1.4 10*3/uL — ABNORMAL HIGH (ref 0.1–1.0)
Monocytes Relative: 19 % — ABNORMAL HIGH (ref 3.0–12.0)
Neutro Abs: 3.4 10*3/uL (ref 1.4–7.7)
Neutrophils Relative %: 47 % (ref 43.0–77.0)
Platelets: 306 10*3/uL (ref 150.0–400.0)
RBC: 4.09 Mil/uL (ref 3.87–5.11)
RDW: 15.7 % — ABNORMAL HIGH (ref 11.5–15.5)
WBC: 7.2 10*3/uL (ref 4.0–10.5)

## 2018-08-11 LAB — BASIC METABOLIC PANEL
BUN: 11 mg/dL (ref 6–23)
CO2: 32 mEq/L (ref 19–32)
Calcium: 9.1 mg/dL (ref 8.4–10.5)
Chloride: 92 mEq/L — ABNORMAL LOW (ref 96–112)
Creatinine, Ser: 1.12 mg/dL (ref 0.40–1.20)
GFR: 58.96 mL/min — ABNORMAL LOW (ref >60.00)
Glucose, Bld: 90 mg/dL (ref 70–99)
Potassium: 3.5 mEq/L (ref 3.5–5.1)
Sodium: 131 mEq/L — ABNORMAL LOW (ref 135–145)

## 2018-08-11 MED ORDER — PREDNISONE 10 MG PO TABS: ORAL_TABLET | ORAL | 0 refills | Status: DC

## 2018-08-11 MED ORDER — LEVALBUTEROL HCL 0.63 MG/3ML IN NEBU
0.6300 mg | INHALATION_SOLUTION | RESPIRATORY_TRACT | Status: AC
  Administered 2018-08-11: 0.63 mg via RESPIRATORY_TRACT

## 2018-08-11 MED ORDER — METHYLPREDNISOLONE ACETATE 80 MG/ML IJ SUSP
120.0000 mg | Freq: Once | INTRAMUSCULAR | Status: AC
  Administered 2018-08-11: 120 mg via INTRAMUSCULAR

## 2018-08-11 MED ORDER — LEVOFLOXACIN 500 MG PO TABS: 500.0000 mg | ORAL_TABLET | Freq: Every day | ORAL | 0 refills | Status: DC

## 2018-08-11 MED ORDER — DUPILUMAB 300 MG/2ML ~~LOC~~ SOSY
300.0000 mg | PREFILLED_SYRINGE | Freq: Once | SUBCUTANEOUS | Status: AC
  Administered 2018-08-05: 300 mg via SUBCUTANEOUS

## 2018-08-11 MED ORDER — FLUTTER DEVI: 0 refills | Status: DC

## 2018-08-11 NOTE — Assessment & Plan Note (Addendum)
-   Xopnex neb x1 with significant improvement  - Depo-medrol 120mg  IM - Prednisone taper as prescribed

## 2018-08-11 NOTE — Progress Notes (Signed)
Dupixent injection documentation and charges entered by Maury Dus, RMA, based on injection sheet filled out by [facility] during preparation and administration. This documentation process is due to office requirements.

## 2018-08-11 NOTE — Assessment & Plan Note (Addendum)
-   CXR showed NEW patchy right middle lobe consolidation consistent with pneumonia - RX Levaquin 500mg  x 7 days  - Recommend increasing Mucinex to 1-2 tabs q12 hours  - Needs flutter valve - FU in 3 days with NP (will also need repeat CXR in 3-4 weeks)

## 2018-08-11 NOTE — Progress Notes (Signed)
@Patient  ID: Tamara Daugherty, female    DOB: 12/20/1950, 68 y.o.   MRN: [id number]  Chief Complaint  Patient presents with  . Acute Visit    cough with clear mucus, headache, head and chest congestion, wheezing x4 days    Referring provider: Eber Hong, MD  HPI: 68 year old female, never smoked (passive exposure). PMH significant for OSA, severe persistent asthma with eosinophilia and recurrent exacerbations. Patient of Dr. Lake Bells, last seen on 07/09/18. She has had 4 exacerbations over the last 6 months, one of which required hospitalization. Last asthma exacerbation was in December, treated with corticosteriod injection and oral prednisone. During last office visit, DR. Lake Bells recommended moving forward with biologic treatment (specifically dupilumab q14 days). Maintained on Symbicort and singulair.   08/11/2018 Patient presents today for acute visit with complaints of chest congestion, productive cough with yellow mucus x 4 days. Associated wheezing and occasional sob. She has been taking mucinex, robitussin and cough drops. Received her second Dupixent injection on 2/12. O2 sat 86-88% with ambulation today, 93% RA with rest. Audible upper airway congestion/wheeze. LS mostly clear with faint exp wheeze right upper lobe. Re-assessed after xopenex neb and depo-medrol injection. Patient states that she is doing a whole lot better and breathing easier. O2 97-99% RA and HR 72. Afebrile.    Allergies  Allergen Reactions  . Bee Venom Anaphylaxis  . Diphenhydramine Hcl Other (See Comments)    Paradoxical reaction/ becomes hyper for days  . Penicillins Hives    No reaction to Ancef.  OK to give.    Marland Kitchen Percocet [Oxycodone-Acetaminophen] Other (See Comments)    hallucination  . Propoxyphene   . Sulfa Antibiotics Itching    Immunization History  Administered Date(s) Administered  . Zoster Recombinat (Shingrix) 09/01/2017, 11/21/2017    Past Medical History:  Diagnosis Date  . Anemia    takes Iron daily  . Anxiety   . Arthritis   . Asthma    Symbicort daily and Albuterol daily as needed  . Bruises easily    d/t being on Plavix   . Complication of anesthesia    long time to wake up - referred to pulmonary after carpel tunnel procedure - for asthma and undiagnosed sleep apnea  . GERD (gastroesophageal reflux disease)    takes Protonix daily  . Headache   . Heart murmur    [facility] [place]  . History of blood transfusion    no abnormal reaction noted  . History of colon polyps    beningn  . History of migraine    couple of wks ago was the last one  . Hyperlipidemia    takes Atorvastatin daily  . Hypertension    takes Benicar and Diltiazem daily  . Hypothyroidism    takes Synthroid daily  . Itching    takes Atarax daily as needed  . Joint pain   . Joint swelling   . Multiple allergies    takes Claritin daily as needed as well as using Flonase if needed  . Muscle spasm    takes Baclofen if needed  . Peripheral edema    takes Torsemide daily  . Pneumonia    hx of  . Primary localized osteoarthritis of left knee 06/21/2014  . Primary localized osteoarthritis of right knee 03/21/2015  . Sleep apnea    cpap  . TIA (transient ischemic attack)    takes Plavix daily;notices right side is slightly weaker than left  . Vertigo  takes Meclizine daily as needed    Tobacco History: Social History   Tobacco Use  Smoking Status Passive Smoke Exposure - Never Smoker  Smokeless Tobacco Never Used  Tobacco Comment   Father smoked briefly.    Counseling given: Not Answered Comment: Father smoked briefly.    Outpatient Medications Prior to Visit  Medication Sig Dispense Refill  . albuterol (PROVENTIL HFA;VENTOLIN HFA) 108 (90 Base) MCG/ACT inhaler Inhale 2 puffs into the lungs every 6 (six) hours as needed for wheezing or shortness of breath. 3 Inhaler 0  . budesonide-formoterol (SYMBICORT) 160-4.5 MCG/ACT inhaler Inhale 2 puffs into the lungs 2  (two) times daily. 1 Inhaler 5  . CALCIUM-VITAMIN D PO Take 1 tablet by mouth daily.    . clopidogrel (PLAVIX) 75 MG tablet Take 1 tablet by mouth daily.    Marland Kitchen diltiazem (MATZIM LA) 240 MG 24 hr tablet Take 240 mg by mouth daily.    Marland Kitchen EPINEPHrine 0.3 mg/0.3 mL IJ SOAJ injection Inject 0.3 mg into the muscle once.    . fluticasone (FLONASE) 50 MCG/ACT nasal spray Place 1 spray into both nostrils 2 (two) times daily as needed for allergies or rhinitis.    Marland Kitchen guaiFENesin (MUCINEX) 600 MG 12 hr tablet Take 600 mg by mouth 2 (two) times daily as needed for to loosen phlegm.    Marland Kitchen guaiFENesin-codeine (ROBITUSSIN AC) 100-10 MG/5ML syrup Take 10 mLs by mouth 4 (four) times daily as needed for cough.    . hydrOXYzine (ATARAX/VISTARIL) 25 MG tablet Take 25 mg by mouth every 8 (eight) hours as needed for itching.    . levothyroxine (SYNTHROID, LEVOTHROID) 50 MCG tablet Take 50 mcg by mouth daily before breakfast.    . loratadine (CLARITIN) 10 MG tablet Take 10 mg by mouth daily as needed for allergies.    Marland Kitchen meclizine (ANTIVERT) 25 MG tablet Take 25 mg by mouth 4 (four) times daily as needed for dizziness.    . montelukast (SINGULAIR) 10 MG tablet Take 1 tablet (10 mg total) by mouth at bedtime. 90 tablet 3  . Multiple Vitamins-Minerals (MULTIVITAMIN WITH MINERALS) tablet Take 1 tablet by mouth daily.    Marland Kitchen olmesartan-hydrochlorothiazide (BENICAR HCT) 40-25 MG per tablet Take 1 tablet by mouth daily.    . Omega-3 Fatty Acids (FISH OIL PO) Take 1 capsule by mouth 2 (two) times daily.    . pantoprazole (PROTONIX) 40 MG tablet Take 40 mg by mouth daily.    . potassium chloride (MICRO-K) 10 MEQ CR capsule Take 30 mEq by mouth daily.     Marland Kitchen pyridOXINE (B-6) 50 MG tablet Take 50 mg by mouth daily.    . simvastatin (ZOCOR) 20 MG tablet Take 20 mg by mouth daily.    Marland Kitchen Spacer/Aero-Holding Chambers (AEROCHAMBER MV) inhaler Use as instructed 1 each 0  . torsemide (DEMADEX) 20 MG tablet Take 20 mg by mouth as needed.     .  TURMERIC PO Take 1 capsule by mouth daily.    . predniSONE (DELTASONE) 10 MG tablet Take 1 tablet by mouth daily.    . budesonide-formoterol (SYMBICORT) 160-4.5 MCG/ACT inhaler Inhale 2 puffs into the lungs 2 (two) times daily. 2 Inhaler 0  . budesonide-formoterol (SYMBICORT) 160-4.5 MCG/ACT inhaler Inhale 2 puffs into the lungs 2 (two) times daily. 1 Inhaler 6   No facility-administered medications prior to visit.     Review of Systems  Review of Systems  Constitutional: Negative.   HENT: Negative.   Respiratory: Positive for cough,  chest tightness, shortness of breath and wheezing.   Cardiovascular: Negative.     Physical Exam  BP (!) 148/84 (BP Location: Left Wrist, Cuff Size: Normal)   Pulse 93   Ht 5\' 6"  (1.676 m)   Wt (!) 314 lb (142.4 kg)   SpO2 93%   BMI 50.68 kg/m  Physical Exam Constitutional:      General: She is not in acute distress.    Appearance: She is obese. She is not ill-appearing.  HENT:     Head: Normocephalic and atraumatic.     Mouth/Throat:     Mouth: Mucous membranes are moist.     Pharynx: Oropharynx is clear.  Neck:     Musculoskeletal: Normal range of motion and neck supple.  Cardiovascular:     Rate and Rhythm: Normal rate and regular rhythm.  Pulmonary:     Breath sounds: No stridor. Wheezing and rhonchi present.     Comments: Dull rhonchi and exp wheeze to right mid-upper lobe, dyspnea on exertion  Musculoskeletal: Normal range of motion.  Skin:    General: Skin is warm and dry.  Neurological:     General: No focal deficit present.     Mental Status: She is alert and oriented to person, place, and time. Mental status is at baseline.  Psychiatric:        Mood and Affect: Mood normal.        Behavior: Behavior normal.        Thought Content: Thought content normal.        Judgment: Judgment normal.      Lab Results:  CBC    Component Value Date/Time   WBC 10.4 04/27/2018 1158   RBC 4.02 04/27/2018 1158   HGB 12.3 04/27/2018  1158   HCT 38.6 04/27/2018 1158   PLT 437 (H) 04/27/2018 1158   MCV 96.0 04/27/2018 1158   MCH 30.6 04/27/2018 1158   MCHC 31.9 04/27/2018 1158   RDW 15.6 (H) 04/27/2018 1158   LYMPHSABS 3.3 04/27/2018 1158   MONOABS 1.0 04/27/2018 1158   EOSABS 0.2 04/27/2018 1158   BASOSABS 0.1 04/27/2018 1158    BMET    Component Value Date/Time   NA 129 (L) 03/22/2015 0625   K 4.3 03/22/2015 0625   CL 95 (L) 03/22/2015 0625   CO2 27 03/22/2015 0625   GLUCOSE 133 (H) 03/22/2015 0625   BUN 18 03/22/2015 0625   CREATININE 1.42 (H) 03/22/2015 0625   CALCIUM 8.6 (L) 03/22/2015 0625   GFRNONAA 39 (L) 03/22/2015 0625   GFRAA 45 (L) 03/22/2015 0625    BNP    Component Value Date/Time   BNP 44.0 04/27/2018 1158    ProBNP No results found for: PROBNP  Imaging: Dg Chest 2 View  Result Date: 08/11/2018 CLINICAL DATA:  68 y/o  F; cough/wheeze with asthma. EXAM: CHEST - 2 VIEW COMPARISON:  03/26/2018 chest radiograph FINDINGS: Stable enlarged cardiac silhouette. Patchy consolidation in the right mid lung zone. No pleural effusion or pneumothorax. Bones are unremarkable. IMPRESSION: 1. Patchy consolidation in the right mid lung zone compatible with pneumonia. Followup PA and lateral chest X-ray is recommended in 3-4 weeks following trial of antibiotic therapy to ensure resolution and exclude underlying malignancy. 2. Stable enlarged cardiac silhouette. These results will be called to the ordering clinician or representative by the Radiologist Assistant, and communication documented in the PACS or zVision Dashboard. Electronically Signed   By: Kristine Garbe M.D.   On: 08/11/2018 14:41  Assessment & Plan:   Severe persistent asthma with exacerbation - Xopnex neb x1 with significant improvement  - Depo-medrol 120mg  IM - Prednisone taper as prescribed   CAP (community acquired pneumonia) - CXR showed NEW patchy right middle lobe consolidation consistent with pneumonia - RX Levaquin  500mg  x 7 days  - Recommend increasing Mucinex to 1-2 tabs q12 hours  - Needs flutter valve - FU in 3 days with NP (will also need repeat CXR in 3-4 weeks)   Martyn Ehrich, NP 08/11/2018

## 2018-08-11 NOTE — Telephone Encounter (Signed)
Thank you :)

## 2018-08-11 NOTE — Patient Instructions (Addendum)
  Orders: - CXR showed patchy area of consolidation in right lobe consistent with pneumonia - Labs today   Office treatments: - Xopenex nebulizer x1 - Depo-medrol injection 120mg  IM x1   Rx: - Prednisone taper as prescribed - Sending in antibiotic   Recommendations: - Increase Mucinex to 1-2 tabs twice daily (max 4 tabs) - Use flutter valve every hour while awake for congestion  Follow up: - 3 days with NP

## 2018-08-11 NOTE — Telephone Encounter (Signed)
Called and spoke with Patient.  She is a Tamara Daugherty Patient, last seen for OSA, asthma, 07/09/18.  She is complaining of chest congestion, productive cough, with clear phlegm, wheezing, feeling SHOB at times, and runny nose, with yellow mucus.  She stated that it started Saturday/Sunday, and has gotten worse.  She denies fever.  She has been taking Tylenol, mucinex, cough drops, and robitussin.  She is scheduled to see Ancil Boozer, NP, at 1:30pm.  She is aware that she is scheduled at [facility]. [place] office.  Nothing further at this time.

## 2018-08-11 NOTE — Telephone Encounter (Signed)
Valarie Merino from [facility] called in regards to patient's CXR that she had today. Below are the impressions.   FINDINGS: Stable enlarged cardiac silhouette. Patchy consolidation in the right mid lung zone. No pleural effusion or pneumothorax. Bones are unremarkable.  IMPRESSION: 1. Patchy consolidation in the right mid lung zone compatible with pneumonia. Followup PA and lateral chest X-ray is recommended in 3-4 weeks following trial of antibiotic therapy to ensure resolution and exclude underlying malignancy. 2. Stable enlarged cardiac silhouette.  [name], please advise. Thanks!

## 2018-08-12 ENCOUNTER — Telehealth: Payer: Self-pay | Admitting: Pulmonary Disease

## 2018-08-12 NOTE — Progress Notes (Signed)
Reviewed, agree 

## 2018-08-12 NOTE — Telephone Encounter (Signed)
Called and spoke with Patient.  She was given a flutter at her OV with Geraldo Pitter, NP, 08/11/18, and she had questions about using it.  Went over OV instructions with Patient.  Explained how to use a flutter valve, and how it works.  Understanding stated. Nothing further needed.

## 2018-08-14 ENCOUNTER — Ambulatory Visit: Payer: PRIVATE HEALTH INSURANCE | Admitting: Primary Care

## 2018-08-14 ENCOUNTER — Encounter: Payer: Self-pay | Admitting: Primary Care

## 2018-08-14 ENCOUNTER — Ambulatory Visit (INDEPENDENT_AMBULATORY_CARE_PROVIDER_SITE_OTHER): Payer: Medicare Other | Admitting: Primary Care

## 2018-08-14 DIAGNOSIS — J4551 Severe persistent asthma with (acute) exacerbation: Secondary | ICD-10-CM | POA: Diagnosis not present

## 2018-08-14 MED ORDER — PREDNISONE 10 MG PO TABS: ORAL_TABLET | ORAL | 0 refills | Status: DC

## 2018-08-14 MED ORDER — HYDROCODONE-HOMATROPINE 5-1.5 MG/5ML PO SYRP: 5.0000 mL | ORAL_SOLUTION | Freq: Four times a day (QID) | ORAL | 0 refills | Status: DC | PRN

## 2018-08-14 NOTE — Assessment & Plan Note (Signed)
-   Extend prednisone taper by one extra day (40mg  x 3 days; 30mg  x 3 days; 20mg  x 3 days x 10mg  x 3 days) - FU in 1 week prior to dupixent injection

## 2018-08-14 NOTE — Progress Notes (Signed)
@Patient  ID: Tamara Daugherty, female    DOB: 04-Jun-1951, 68 y.o.   MRN: [id number]  Chief Complaint  Patient presents with  . Follow-up    SOB with exertion and lying, cough with little clear mucus, occasional wheezing, headache, nasal congestion with bloody to yellow to clear mucus    Referring provider: Eber Hong, MD  HPI: 68 year old female, never smoked (passive exposure). PMH significant for OSA, severe persistent asthma with eosinophilia and recurrent exacerbations. Patient of Dr. Lake Bells, last seen on 07/09/18. She has had 4 exacerbations over the last 6 months, one of which required hospitalization. Last asthma exacerbation was in December, treated with corticosteriod injection and oral prednisone. During last office visit, DR. Lake Bells recommended moving forward with biologic treatment (specifically dupilumab q14 days). Maintained on Symbicort and singulair.   08/11/2018 Patient presents today for acute visit with complaints of chest congestion, productive cough with yellow mucus x 4 days. Associated wheezing and occasional sob. She has been taking mucinex, robitussin and cough drops. Received her second Dupixent injection on 2/12. O2 sat 86-88% with ambulation today, 93% RA with rest. Audible upper airway congestion/wheeze. LS mostly clear with faint exp wheeze right upper lobe. Re-assessed after xopenex neb and depo-medrol injection. Patient states that she is doing a whole lot better and breathing easier. O2 97-99% RA and HR 72. Afebrile.   08/14/2018 Patient presents today for 3 day follow-up visit. Husband drove her but stayed in the car. She feels some better, still has some shortness of breath when lying down. States that she can breath pretty good sitting up, using a recliner at night. Cough is productive, clearing. She has been taking Levaquin and prednisone taper as prescribed. Using nebulizer 3-4 times a day. Taking mucinex 2 tabs twice a day. She is eating and drinking. She is  resting and not exerting herself. Afebrile.    Allergies  Allergen Reactions  . Bee Venom Anaphylaxis  . Diphenhydramine Hcl Other (See Comments)    Paradoxical reaction/ becomes hyper for days  . Penicillins Hives    No reaction to Ancef.  OK to give.    Marland Kitchen Percocet [Oxycodone-Acetaminophen] Other (See Comments)    hallucination  . Propoxyphene   . Sulfa Antibiotics Itching    Immunization History  Administered Date(s) Administered  . Zoster Recombinat (Shingrix) 09/01/2017, 11/21/2017    Past Medical History:  Diagnosis Date  . Anemia    takes Iron daily  . Anxiety   . Arthritis   . Asthma    Symbicort daily and Albuterol daily as needed  . Bruises easily    d/t being on Plavix   . Complication of anesthesia    long time to wake up - referred to pulmonary after carpel tunnel procedure - for asthma and undiagnosed sleep apnea  . GERD (gastroesophageal reflux disease)    takes Protonix daily  . Headache   . Heart murmur    [facility] - [place]  . History of blood transfusion    no abnormal reaction noted  . History of colon polyps    [facility]  . History of migraine    couple of wks ago was the last one  . Hyperlipidemia    takes Atorvastatin daily  . Hypertension    takes Benicar and Diltiazem daily  . Hypothyroidism    takes Synthroid daily  . Itching    takes Atarax daily as needed  . Joint pain   . Joint swelling   . Multiple  allergies    takes Claritin daily as needed as well as using Flonase if needed  . Muscle spasm    takes Baclofen if needed  . Peripheral edema    takes Torsemide daily  . Pneumonia    hx of  . Primary localized osteoarthritis of left knee 06/21/2014  . Primary localized osteoarthritis of right knee 03/21/2015  . Sleep apnea    cpap  . TIA (transient ischemic attack)    takes Plavix daily;notices right side is slightly weaker than left  . Vertigo    takes Meclizine daily as needed    Tobacco History: Social History     Tobacco Use  Smoking Status Passive Smoke Exposure - Never Smoker  Smokeless Tobacco Never Used  Tobacco Comment   Father smoked briefly.    Counseling given: Not Answered Comment: Father smoked briefly.    Outpatient Medications Prior to Visit  Medication Sig Dispense Refill  . albuterol (PROVENTIL HFA;VENTOLIN HFA) 108 (90 Base) MCG/ACT inhaler Inhale 2 puffs into the lungs every 6 (six) hours as needed for wheezing or shortness of breath. 3 Inhaler 0  . budesonide-formoterol (SYMBICORT) 160-4.5 MCG/ACT inhaler Inhale 2 puffs into the lungs 2 (two) times daily. 1 Inhaler 5  . CALCIUM-VITAMIN D PO Take 1 tablet by mouth daily.    . clopidogrel (PLAVIX) 75 MG tablet Take 1 tablet by mouth daily.    Marland Kitchen diltiazem (MATZIM LA) 240 MG 24 hr tablet Take 240 mg by mouth daily.    Marland Kitchen EPINEPHrine 0.3 mg/0.3 mL IJ SOAJ injection Inject 0.3 mg into the muscle once.    . fluticasone (FLONASE) 50 MCG/ACT nasal spray Place 1 spray into both nostrils 2 (two) times daily as needed for allergies or rhinitis.    Marland Kitchen guaiFENesin (MUCINEX) 600 MG 12 hr tablet Take 600 mg by mouth 2 (two) times daily as needed for to loosen phlegm.    Marland Kitchen guaiFENesin-codeine (ROBITUSSIN AC) 100-10 MG/5ML syrup Take 10 mLs by mouth 4 (four) times daily as needed for cough.    . hydrOXYzine (ATARAX/VISTARIL) 25 MG tablet Take 25 mg by mouth every 8 (eight) hours as needed for itching.    Marland Kitchen levofloxacin (LEVAQUIN) 500 MG tablet Take 1 tablet (500 mg total) by mouth daily. 7 tablet 0  . levothyroxine (SYNTHROID, LEVOTHROID) 50 MCG tablet Take 50 mcg by mouth daily before breakfast.    . loratadine (CLARITIN) 10 MG tablet Take 10 mg by mouth daily as needed for allergies.    Marland Kitchen meclizine (ANTIVERT) 25 MG tablet Take 25 mg by mouth 4 (four) times daily as needed for dizziness.    . montelukast (SINGULAIR) 10 MG tablet Take 1 tablet (10 mg total) by mouth at bedtime. 90 tablet 3  . Multiple Vitamins-Minerals (MULTIVITAMIN WITH MINERALS)  tablet Take 1 tablet by mouth daily.    Marland Kitchen olmesartan-hydrochlorothiazide (BENICAR HCT) 40-25 MG per tablet Take 1 tablet by mouth daily.    . Omega-3 Fatty Acids (FISH OIL PO) Take 1 capsule by mouth 2 (two) times daily.    . pantoprazole (PROTONIX) 40 MG tablet Take 40 mg by mouth daily.    . potassium chloride (MICRO-K) 10 MEQ CR capsule Take 30 mEq by mouth daily.     . predniSONE (DELTASONE) 10 MG tablet Take 1 tablet by mouth daily.    Marland Kitchen pyridOXINE (B-6) 50 MG tablet Take 50 mg by mouth daily.    Marland Kitchen Respiratory Therapy Supplies (FLUTTER) DEVI Use flutter valve 3 times a  day 1 each 0  . simvastatin (ZOCOR) 20 MG tablet Take 20 mg by mouth daily.    Marland Kitchen Spacer/Aero-Holding Chambers (AEROCHAMBER MV) inhaler Use as instructed 1 each 0  . torsemide (DEMADEX) 20 MG tablet Take 20 mg by mouth as needed.     . TURMERIC PO Take 1 capsule by mouth daily.    . predniSONE (DELTASONE) 10 MG tablet Take 5 tabs daily x 2 days; 4 tabs daily x 2 days; 3 tabs daily x 2 days; 2 tabs daily x 2 days; 1 tab daily x 2 days; stop 30 tablet 0   No facility-administered medications prior to visit.     Review of Systems  Review of Systems  Constitutional: Negative.   HENT: Positive for congestion and postnasal drip.   Respiratory: Positive for cough, shortness of breath and wheezing.   Cardiovascular: Negative.   Gastrointestinal: Positive for constipation.    Physical Exam  BP 122/88 (BP Location: Right Wrist, Cuff Size: Normal)   Pulse 71   Ht 5\' 6"  (1.676 m)   Wt (!) 318 lb 3.2 oz (144.3 kg)   SpO2 94%   BMI 51.36 kg/m  Physical Exam Constitutional:      General: She is not in acute distress.    Appearance: Normal appearance. She is obese. She is not ill-appearing.  HENT:     Head: Normocephalic and atraumatic.  Cardiovascular:     Rate and Rhythm: Normal rate and regular rhythm.  Pulmonary:     Effort: Pulmonary effort is normal.     Breath sounds: Wheezing present.     Comments: Scattered  wheezing; no distress Musculoskeletal: Normal range of motion.     Comments: Uses WC for long distance while acutely sick   Neurological:     General: No focal deficit present.     Mental Status: She is alert and oriented to person, place, and time. Mental status is at baseline.  Psychiatric:        Mood and Affect: Mood normal.        Behavior: Behavior normal.        Thought Content: Thought content normal.        Judgment: Judgment normal.      Lab Results:  CBC    Component Value Date/Time   WBC 7.2 08/11/2018 1458   RBC 4.09 08/11/2018 1458   HGB 12.1 08/11/2018 1458   HCT 37.2 08/11/2018 1458   PLT 306.0 08/11/2018 1458   MCV 90.8 08/11/2018 1458   MCH 30.6 04/27/2018 1158   MCHC 32.5 08/11/2018 1458   RDW 15.7 (H) 08/11/2018 1458   LYMPHSABS 2.1 08/11/2018 1458   MONOABS 1.4 (H) 08/11/2018 1458   EOSABS 0.3 08/11/2018 1458   BASOSABS 0.1 08/11/2018 1458    BMET    Component Value Date/Time   NA 131 (L) 08/11/2018 1458   K 3.5 08/11/2018 1458   CL 92 (L) 08/11/2018 1458   CO2 32 08/11/2018 1458   GLUCOSE 90 08/11/2018 1458   BUN 11 08/11/2018 1458   CREATININE 1.12 08/11/2018 1458   CALCIUM 9.1 08/11/2018 1458   GFRNONAA 39 (L) 03/22/2015 0625   GFRAA 45 (L) 03/22/2015 0625    BNP    Component Value Date/Time   BNP 44.0 04/27/2018 1158    ProBNP No results found for: PROBNP  Imaging: Dg Chest 2 View  Result Date: 08/11/2018 CLINICAL DATA:  68 y/o  F; cough/wheeze with asthma. EXAM: CHEST - 2 VIEW COMPARISON:  03/26/2018 chest radiograph FINDINGS: Stable enlarged cardiac silhouette. Patchy consolidation in the right mid lung zone. No pleural effusion or pneumothorax. Bones are unremarkable. IMPRESSION: 1. Patchy consolidation in the right mid lung zone compatible with pneumonia. Followup PA and lateral chest X-ray is recommended in 3-4 weeks following trial of antibiotic therapy to ensure resolution and exclude underlying malignancy. 2. Stable  enlarged cardiac silhouette. These results will be called to the ordering clinician or representative by the Radiologist Assistant, and communication documented in the PACS or zVision Dashboard. Electronically Signed   By: Kristine Garbe M.D.   On: 08/11/2018 14:41     Assessment & Plan:   CAP (community acquired pneumonia) - Slowly improving - Continue Levaquin until complete - Continue nebulizers scheduled every 4-6 hours  - Continue mucinex 2 tabs twice daily for congestion - RX hycodan 37ml q6 hours for cough suppression  - Will need repeat CXR in 3 weeks  - Labs next week   Severe persistent asthma with exacerbation - Extend prednisone taper by one extra day (40mg  x 3 days; 30mg  x 3 days; 20mg  x 3 days x 10mg  x 3 days) - FU in 1 week prior to dupixent injection    Martyn Ehrich, NP 08/14/2018

## 2018-08-14 NOTE — Assessment & Plan Note (Addendum)
-   Slowly improving - Continue Levaquin until complete - Continue nebulizers scheduled every 4-6 hours  - Continue mucinex 2 tabs twice daily for congestion - RX hycodan 57ml q6 hours for cough suppression  - Will need repeat CXR in 3 weeks  - Labs next week

## 2018-08-14 NOTE — Patient Instructions (Addendum)
Continue Levaquin until complete Continue nebulizers scheduled every 4-6 hours  Continue mucinex 2 tabs twice daily for congestion Extend prednisone taper by one extra day (Example- 40, 40, 40, 30, 30, 30, 20, 20, 20, 10, 10, 10)  Bowel regimen: Prune juice Colace twice daily Miralax over the counter for no BM > 3 days  Rx: Hycodan cough syrup 16ml q 6 hours as needed for cough  Follow up: 1 week with [name] prior to dupixent injection (labs before)

## 2018-08-14 NOTE — Progress Notes (Signed)
Reviewed, agree 

## 2018-08-18 ENCOUNTER — Telehealth: Payer: Self-pay | Admitting: Pulmonary Disease

## 2018-08-18 NOTE — Telephone Encounter (Signed)
I'm OK with her getting Dupixent on Friday  Can we ask the rep for Dupixent if there is any other sort of financial aid offered?

## 2018-08-18 NOTE — Telephone Encounter (Signed)
Message routed to [place] to follow up on financial aid for [facility]

## 2018-08-18 NOTE — Telephone Encounter (Signed)
Spoke with pt, she is stating that she cannot afford the Dupixent at 1800 a month. They told her that she made over the limit to get assistance. She would like to know if she can try something else or if BQ has any suggestions. She stated she might not be well enough to get the Dupixent on Friday because she was just diagnosed with pneumonia and doesn't know if she will be well by then. BQ please advise. TS you may want to check with pt since she is on your schedule for Friday.

## 2018-08-20 ENCOUNTER — Telehealth: Payer: Self-pay | Admitting: Pulmonary Disease

## 2018-08-20 ENCOUNTER — Ambulatory Visit: Payer: PRIVATE HEALTH INSURANCE

## 2018-08-20 NOTE — Telephone Encounter (Signed)
Called pt back to let her know, I called [employer] and they siad their co-pay asst. Is the only one they offer. So I went online and typed in patient assistance programs. I clicked on Pt. Assist... Can't afford your meds.. The [employer] came up, I put in her zip code in, then they asked for cost of meds. Every month and a/b her income. Called pt to let her know, I can't apply for this for her, she needs to either go on the website or call [phone].

## 2018-08-21 ENCOUNTER — Ambulatory Visit (INDEPENDENT_AMBULATORY_CARE_PROVIDER_SITE_OTHER): Payer: Medicare Other | Admitting: Primary Care

## 2018-08-21 ENCOUNTER — Encounter: Payer: Self-pay | Admitting: Primary Care

## 2018-08-21 DIAGNOSIS — J4551 Severe persistent asthma with (acute) exacerbation: Secondary | ICD-10-CM | POA: Diagnosis not present

## 2018-08-21 LAB — BASIC METABOLIC PANEL
BUN: 15 mg/dL (ref 6–23)
CO2: 32 mEq/L (ref 19–32)
Calcium: 8.9 mg/dL (ref 8.4–10.5)
Chloride: 94 mEq/L — ABNORMAL LOW (ref 96–112)
Creatinine, Ser: 1.06 mg/dL (ref 0.40–1.20)
GFR: 62.82 mL/min (ref >60.00)
Glucose, Bld: 98 mg/dL (ref 70–99)
Potassium: 3.6 mEq/L (ref 3.5–5.1)
Sodium: 133 mEq/L — ABNORMAL LOW (ref 135–145)

## 2018-08-21 LAB — CBC WITH DIFFERENTIAL/PLATELET
Basophils Absolute: 0 10*3/uL (ref 0.0–0.1)
Basophils Relative: 0.3 % (ref 0.0–3.0)
Eosinophils Absolute: 0.1 10*3/uL (ref 0.0–0.7)
Eosinophils Relative: 0.5 % (ref 0.0–5.0)
HCT: 37.2 % (ref 36.0–46.0)
Hemoglobin: 12.1 g/dL (ref 12.0–15.0)
Lymphocytes Relative: 20.6 % (ref 12.0–46.0)
Lymphs Abs: 2.9 10*3/uL (ref 0.7–4.0)
MCHC: 32.6 g/dL (ref 30.0–36.0)
MCV: 90 fl (ref 78.0–100.0)
Monocytes Absolute: 1.6 10*3/uL — ABNORMAL HIGH (ref 0.1–1.0)
Monocytes Relative: 11.5 % (ref 3.0–12.0)
Neutro Abs: 9.3 10*3/uL — ABNORMAL HIGH (ref 1.4–7.7)
Neutrophils Relative %: 67.1 % (ref 43.0–77.0)
Platelets: 378 10*3/uL (ref 150.0–400.0)
RBC: 4.13 Mil/uL (ref 3.87–5.11)
RDW: 15.7 % — ABNORMAL HIGH (ref 11.5–15.5)
WBC: 13.9 10*3/uL — ABNORMAL HIGH (ref 4.0–10.5)

## 2018-08-21 MED ORDER — BUDESONIDE-FORMOTEROL FUMARATE 160-4.5 MCG/ACT IN AERO: 2.0000 | INHALATION_SPRAY | Freq: Two times a day (BID) | RESPIRATORY_TRACT | 0 refills | Status: DC

## 2018-08-21 MED ORDER — PREDNISONE 10 MG PO TABS: ORAL_TABLET | ORAL | 0 refills | Status: DC

## 2018-08-21 NOTE — Telephone Encounter (Signed)
Thanks, would prefer to make sure we have made all appeals for Dupixent

## 2018-08-21 NOTE — Assessment & Plan Note (Signed)
-   Completed Levaquin course - Needs repeat labs today and CXR in 2-3 weeks

## 2018-08-21 NOTE — Assessment & Plan Note (Addendum)
-   Improved after extended prednisone taper and depo-medrol injection on 2/18 - Continues Symbicort and singulair - Due for 3rd Dupixent injection today (dupilumab q14 days) - RX prednisone taper to have on hand prn acute asthma exacerbation  - Eos absolute 300 in Feb 2020; IgE 107 in Nov 2019

## 2018-08-21 NOTE — Progress Notes (Signed)
Reviewed, discussed, agree 

## 2018-08-21 NOTE — Progress Notes (Signed)
@Patient  ID: Tamara Daugherty, female    DOB: [birth year], 68 y.o.   MRN: [id number]  Chief Complaint  Patient presents with  . Follow-up    thinks Dupixent is helping, unable to get due to price    Referring provider: Eber Hong, MD  HPI: 68 year old female, never smoked (passive exposure). PMH significant for OSA, severe persistent asthma with eosinophilia and recurrent exacerbations. Patient of Dr. Lake Bells, last seen on 07/09/18. She has had 4 exacerbations over the last 6 months, one of which required hospitalization. Last asthma exacerbation was in December, treated with corticosteriod injection and oral prednisone. During last office visit, DR. Lake Bells recommended moving forward with biologic treatment (specifically dupilumab q14 days). Maintained on Symbicort and singulair. Eos absolute 300 in Feb 2020, IgE 107.   08/11/2018- Acute OV Patient presents today for acute visit with complaints of chest congestion, productive cough with yellow mucus x 4 days. Associated wheezing and occasional sob. She has been taking mucinex, robitussin and cough drops. Received her second Dupixent injection on 2/12. O2 sat 86-88% with ambulation today, 93% RA with rest. Audible upper airway congestion/wheeze. LS mostly clear with faint exp wheeze right upper lobe. Re-assessed after xopenex neb and depo-medrol injection. Patient states that she is doing a whole lot better and breathing easier. O2 97-99% RA and HR 72. Afebrile.   08/14/2018- Follow-up Patient presents today for 3 day follow-up visit for asthma exacerbation. Husband drove her but stayed in the car. She feels some better, still has some shortness of breath when lying down. States that she can breath pretty good sitting up, using a recliner at night. Cough is productive, clearing. She has been taking Levaquin and prednisone taper as prescribed. Using nebulizer 3-4 times a day. Taking mucinex 2 tabs twice a day. She is eating and drinking. She is resting  and not exerting herself. Afebrile.   08/21/2018- Follow-up Patient presents today for 2nd follow-up for recent severe asthma exacerbation and community acquired pnuemonia. She feels a 100% better after completing Levaquin course, an extended prednisone taper and receiving depo-medrol injection last week in office. Moving more air and able to speak in complete sentences. She think Dupixent has been helping. She is due for her 3rd Dupixent injection today. If she appears well ok to receive injection. Her co-pay for Dupixent will cost $1,800 month, she did not qualify for patient assistance and is unable to afford co-pay. Needs repeat labs today and CXR in 2-3 weeks.    Allergies  Allergen Reactions  . Bee Venom Anaphylaxis  . Diphenhydramine Hcl Other (See Comments)    Paradoxical reaction/ becomes hyper for days  . Penicillins Hives    No reaction to Ancef.  OK to give.    Marland Kitchen Percocet [Oxycodone-Acetaminophen] Other (See Comments)    hallucination  . Propoxyphene   . Sulfa Antibiotics Itching    Immunization History  Administered Date(s) Administered  . Influenza Split 06/06/1999, 05/10/2000  . Zoster Recombinat (Shingrix) 09/01/2017, 11/21/2017    Past Medical History:  Diagnosis Date  . Anemia    takes Iron daily  . Anxiety   . Arthritis   . Asthma    Symbicort daily and Albuterol daily as needed  . Bruises easily    d/t being on Plavix   . Complication of anesthesia    long time to wake up - referred to pulmonary after carpel tunnel procedure - for asthma and undiagnosed sleep apnea  . GERD (gastroesophageal reflux disease)  takes Protonix daily  . Headache   . Heart murmur    [facility] [place]  . History of blood transfusion    no abnormal reaction noted  . History of colon polyps    [facility]  . History of migraine    couple of wks ago was the last one  . Hyperlipidemia    takes Atorvastatin daily  . Hypertension    takes Benicar and Diltiazem daily  .  Hypothyroidism    takes Synthroid daily  . Itching    takes Atarax daily as needed  . Joint pain   . Joint swelling   . Multiple allergies    takes Claritin daily as needed as well as using Flonase if needed  . Muscle spasm    takes Baclofen if needed  . Peripheral edema    takes Torsemide daily  . Pneumonia    hx of  . Primary localized osteoarthritis of left knee 06/21/2014  . Primary localized osteoarthritis of right knee 03/21/2015  . Sleep apnea    cpap  . TIA (transient ischemic attack)    takes Plavix daily;notices right side is slightly weaker than left  . Vertigo    takes Meclizine daily as needed    Tobacco History: Social History   Tobacco Use  Smoking Status Passive Smoke Exposure - Never Smoker  Smokeless Tobacco Never Used  Tobacco Comment   Father smoked briefly.    Counseling given: Not Answered Comment: Father smoked briefly.    Outpatient Medications Prior to Visit  Medication Sig Dispense Refill  . albuterol (PROVENTIL HFA;VENTOLIN HFA) 108 (90 Base) MCG/ACT inhaler Inhale 2 puffs into the lungs every 6 (six) hours as needed for wheezing or shortness of breath. 3 Inhaler 0  . CALCIUM-VITAMIN D PO Take 1 tablet by mouth daily.    . clopidogrel (PLAVIX) 75 MG tablet Take 1 tablet by mouth daily.    Marland Kitchen diltiazem (MATZIM LA) 240 MG 24 hr tablet Take 240 mg by mouth daily.    Marland Kitchen EPINEPHrine 0.3 mg/0.3 mL IJ SOAJ injection Inject 0.3 mg into the muscle once.    . fluticasone (FLONASE) 50 MCG/ACT nasal spray Place 1 spray into both nostrils 2 (two) times daily as needed for allergies or rhinitis.    Marland Kitchen guaiFENesin (MUCINEX) 600 MG 12 hr tablet Take 600 mg by mouth 2 (two) times daily as needed for to loosen phlegm.    Marland Kitchen HYDROcodone-homatropine (HYCODAN) 5-1.5 MG/5ML syrup Take 5 mLs by mouth every 6 (six) hours as needed for cough. 240 mL 0  . hydrOXYzine (ATARAX/VISTARIL) 25 MG tablet Take 25 mg by mouth every 8 (eight) hours as needed for itching.    Marland Kitchen  levofloxacin (LEVAQUIN) 500 MG tablet Take 1 tablet (500 mg total) by mouth daily. 7 tablet 0  . levothyroxine (SYNTHROID, LEVOTHROID) 50 MCG tablet Take 50 mcg by mouth daily before breakfast.    . loratadine (CLARITIN) 10 MG tablet Take 10 mg by mouth daily as needed for allergies.    Marland Kitchen meclizine (ANTIVERT) 25 MG tablet Take 25 mg by mouth 4 (four) times daily as needed for dizziness.    . montelukast (SINGULAIR) 10 MG tablet Take 1 tablet (10 mg total) by mouth at bedtime. 90 tablet 3  . Multiple Vitamins-Minerals (MULTIVITAMIN WITH MINERALS) tablet Take 1 tablet by mouth daily.    Marland Kitchen olmesartan-hydrochlorothiazide (BENICAR HCT) 40-25 MG per tablet Take 1 tablet by mouth daily.    . Omega-3 Fatty Acids (FISH  OIL PO) Take 1 capsule by mouth 2 (two) times daily.    . pantoprazole (PROTONIX) 40 MG tablet Take 40 mg by mouth daily.    . potassium chloride (MICRO-K) 10 MEQ CR capsule Take 30 mEq by mouth daily.     . predniSONE (DELTASONE) 10 MG tablet Take 1 tablet by mouth daily.    Marland Kitchen pyridOXINE (B-6) 50 MG tablet Take 50 mg by mouth daily.    Marland Kitchen Respiratory Therapy Supplies (FLUTTER) DEVI Use flutter valve 3 times a day 1 each 0  . simvastatin (ZOCOR) 20 MG tablet Take 20 mg by mouth daily.    Marland Kitchen Spacer/Aero-Holding Chambers (AEROCHAMBER MV) inhaler Use as instructed 1 each 0  . torsemide (DEMADEX) 20 MG tablet Take 20 mg by mouth as needed.     . TURMERIC PO Take 1 capsule by mouth daily.    . budesonide-formoterol (SYMBICORT) 160-4.5 MCG/ACT inhaler Inhale 2 puffs into the lungs 2 (two) times daily. 1 Inhaler 5  . predniSONE (DELTASONE) 10 MG tablet Extend prednisone taper - take 4 tabs qd x 3 days, 3 tabs x 3 days, 2 tabs x 3 days, 1 tab x 3 days 20 tablet 0   No facility-administered medications prior to visit.     Review of Systems  Review of Systems  Constitutional: Negative.   HENT: Positive for ear pain.   Respiratory: Negative for cough, shortness of breath and wheezing.     Cardiovascular: Negative.     Physical Exam  BP 130/60 (BP Location: Left Arm, Cuff Size: Normal)   Pulse 93   Ht 5\' 6"  (1.676 m)   Wt (!) 318 lb 12.8 oz (144.6 kg)   SpO2 95%   BMI 51.46 kg/m  Physical Exam Constitutional:      General: She is not in acute distress.    Appearance: Normal appearance. She is obese.  HENT:     Right Ear: Tympanic membrane and ear canal normal. There is no impacted cerumen.     Left Ear: Tympanic membrane and ear canal normal. There is no impacted cerumen.     Mouth/Throat:     Mouth: Mucous membranes are moist.     Pharynx: Oropharynx is clear.  Neck:     Musculoskeletal: Normal range of motion and neck supple.  Cardiovascular:     Rate and Rhythm: Normal rate and regular rhythm.  Pulmonary:     Effort: Pulmonary effort is normal.     Breath sounds: No wheezing or rhonchi.     Comments: CTA, slightly diminished but moving more air. NO resp distress Musculoskeletal: Normal range of motion.  Skin:    General: Skin is warm and dry.  Neurological:     General: No focal deficit present.     Mental Status: She is alert and oriented to person, place, and time. Mental status is at baseline.  Psychiatric:        Mood and Affect: Mood normal.        Behavior: Behavior normal.        Thought Content: Thought content normal.        Judgment: Judgment normal.      Lab Results:  CBC    Component Value Date/Time   WBC 7.2 08/11/2018 1458   RBC 4.09 08/11/2018 1458   HGB 12.1 08/11/2018 1458   HCT 37.2 08/11/2018 1458   PLT 306.0 08/11/2018 1458   MCV 90.8 08/11/2018 1458   MCH 30.6 04/27/2018 1158   MCHC 32.5  08/11/2018 1458   RDW 15.7 (H) 08/11/2018 1458   LYMPHSABS 2.1 08/11/2018 1458   MONOABS 1.4 (H) 08/11/2018 1458   EOSABS 0.3 08/11/2018 1458   BASOSABS 0.1 08/11/2018 1458    BMET    Component Value Date/Time   NA 131 (L) 08/11/2018 1458   K 3.5 08/11/2018 1458   CL 92 (L) 08/11/2018 1458   CO2 32 08/11/2018 1458    GLUCOSE 90 08/11/2018 1458   BUN 11 08/11/2018 1458   CREATININE 1.12 08/11/2018 1458   CALCIUM 9.1 08/11/2018 1458   GFRNONAA 39 (L) 03/22/2015 0625   GFRAA 45 (L) 03/22/2015 0625    BNP    Component Value Date/Time   BNP 44.0 04/27/2018 1158    ProBNP No results found for: PROBNP  Imaging: Dg Chest 2 View  Result Date: 08/11/2018 CLINICAL DATA:  68 y/o  F; cough/wheeze with asthma. EXAM: CHEST - 2 VIEW COMPARISON:  03/26/2018 chest radiograph FINDINGS: Stable enlarged cardiac silhouette. Patchy consolidation in the right mid lung zone. No pleural effusion or pneumothorax. Bones are unremarkable. IMPRESSION: 1. Patchy consolidation in the right mid lung zone compatible with pneumonia. Followup PA and lateral chest X-ray is recommended in 3-4 weeks following trial of antibiotic therapy to ensure resolution and exclude underlying malignancy. 2. Stable enlarged cardiac silhouette. These results will be called to the ordering clinician or representative by the Radiologist Assistant, and communication documented in the PACS or zVision Dashboard. Electronically Signed   By: Kristine Garbe M.D.   On: 08/11/2018 14:41     Assessment & Plan:   68 year old female, never smoked. PMH significant for severe persistent asthma with eosinophilia and recurrent exacerbations. She has had 4 exacerbations in the last 6 months, one of which required patient to be hospitalized. Most recent exacerbation likely could have resulted in a hospital admission, however, we were able to successfully treat her outpatient with close follow-up. I treated her for CAP with Levaquin and we needed to extend her prednisone taper during last office visit. She is doing better today, able to talk in mostly full sentences. Lung sounds were diminished on exam but she appears to be moving more air with no audible wheezes. She is due for 3rd Dupixent injection today, she is starting to notice some improvement with  injections. She is able to walk without significant dyspnea symptoms, where as before she needed a WC. Her co-pay for Dupixent will cost her $1,800 month, she did not qualify for patient assistance and is unable to afford co-pay. Plan is to speak with drug rep to see if there are any additional avenues we can explore to get medication for patient. Preferred biologic is Dupixent d/t severity of her asthma, recommend exhausting all our options before trying to get another Biologic approved. If patient is unable to afford medication next option would be Fasenra. Needs repeat labs today and CXR in 2-3 weeks. FU in 2 months with Dr. Lake Bells or NP.   Severe persistent asthma with exacerbation - Improved after extended prednisone taper and depo-medrol injection on 2/18 - Continues Symbicort and singulair - Due for 3rd Dupixent injection today (dupilumab q14 days) - RX prednisone taper to have on hand prn acute asthma exacerbation  - Eos absolute 300 in Feb 2020; IgE 107 in Nov 2019  CAP (community acquired pneumonia) - Completed Levaquin course - Needs repeat labs today and CXR in 2-3 weeks    Martyn Ehrich, NP 08/21/2018

## 2018-08-21 NOTE — Telephone Encounter (Signed)
Pt came in today to see [name] for a follow up office visit for asthma. She stated she was doing better being on Dupixent since her last injection. I asked her about the patient assistance that Tammy told her about yesterday and she stated they denied her as well. They denied her because they do offer help with medications but Dupixent was not on the list for coverage and they do not help with that drug at all. She is stating that she cannot afford the [place] but it looks like we have exhuasted all means. I asked Daneil Dan to get in touch with the drug rep for Dupixent to see if there were any other options. If not, Eustaquio Maize will have her switch to [place] and I will start the process for this injection.

## 2018-08-21 NOTE — Patient Instructions (Signed)
Ok to receive Dupixent injection today  Needs to process PA for Dupixent and discuss options with drug rep for coverage If not covered and we have exhausted all our options would then likely switch patient to Berna Bue   Rx: Prednisone taper to have on hand (take as prescribed if experiencing an acute asthma exacerbation)  Follow up: 2 months with Dr. Lake Bells or NP

## 2018-08-21 NOTE — Telephone Encounter (Signed)
Pt received the sample of Dupixent today and signed the enrollment forms for Fasenra in case she is changed, these forms have been given to Alroy Bailiff to hold on to if/when the provider needs to sign the enrollment form.   I have called the [place] rep at [phone] and had to leave a VM, I expressed urgency in the VM to have my call returned asap. Will call back. Will route to BQ, [name], and injection pool.

## 2018-08-21 NOTE — Telephone Encounter (Signed)
Will route to [place] and [place] for f/u, as I am unable to route to injection PA pool. Thanks

## 2018-08-24 ENCOUNTER — Telehealth: Payer: Self-pay | Admitting: Pulmonary Disease

## 2018-08-25 NOTE — Telephone Encounter (Signed)
Tamara Daugherty, Patient sent this message to you.  am feeling better...but I have no energy whatsoever ...is there any thing I can do to get back to feeling like myself?    Message routed to [facility]

## 2018-08-25 NOTE — Telephone Encounter (Signed)
I think its just going to take time unfortunately. Stay well hydrated, get good protein sources, try boost or ensure shakes, get some light exercise.

## 2018-08-31 NOTE — Telephone Encounter (Signed)
Error

## 2018-09-01 ENCOUNTER — Ambulatory Visit (INDEPENDENT_AMBULATORY_CARE_PROVIDER_SITE_OTHER): Payer: Medicare Other | Admitting: Pulmonary Disease

## 2018-09-01 ENCOUNTER — Encounter: Payer: Self-pay | Admitting: Pulmonary Disease

## 2018-09-01 ENCOUNTER — Ambulatory Visit (INDEPENDENT_AMBULATORY_CARE_PROVIDER_SITE_OTHER)
Admission: RE | Admit: 2018-09-01 | Discharge: 2018-09-01 | Disposition: A | Discharge status: Home / Self Care | Payer: Medicare Other | Source: Ambulatory Visit | Attending: Pulmonary Disease | Admitting: Pulmonary Disease

## 2018-09-01 VITALS — BP 122/78 | HR 92 | Ht 66.0 in | Wt 309.4 lb

## 2018-09-01 DIAGNOSIS — J4551 Severe persistent asthma with (acute) exacerbation: Secondary | ICD-10-CM | POA: Diagnosis not present

## 2018-09-01 DIAGNOSIS — G4733 Obstructive sleep apnea (adult) (pediatric): Secondary | ICD-10-CM

## 2018-09-01 DIAGNOSIS — J181 Lobar pneumonia, unspecified organism: Secondary | ICD-10-CM

## 2018-09-01 DIAGNOSIS — J189 Pneumonia, unspecified organism: Secondary | ICD-10-CM

## 2018-09-01 DIAGNOSIS — J4550 Severe persistent asthma, uncomplicated: Secondary | ICD-10-CM

## 2018-09-01 DIAGNOSIS — Z9989 Dependence on other enabling machines and devices: Secondary | ICD-10-CM

## 2018-09-01 MED ORDER — BUDESONIDE-FORMOTEROL FUMARATE 160-4.5 MCG/ACT IN AERO: 2.0000 | INHALATION_SPRAY | Freq: Two times a day (BID) | RESPIRATORY_TRACT | 0 refills | Status: DC

## 2018-09-01 NOTE — Addendum Note (Signed)
Addended by: Jannette Spanner on: 09/01/2018 04:40 PM   Modules accepted: Orders

## 2018-09-01 NOTE — Patient Instructions (Signed)
Severe persistent asthma with eosinophilia: We will make sure we have exhausted all options to help you get financial coverage for Dupixent If there are no other options then we will change you to a drug called Berna Bue Continue taking Symbicort as you are doing Continue taking montelukast as you are doing Practice good hand hygiene Stay active Use albuterol as needed for chest tightness wheezing or shortness of breath  Pneumonia: Repeat chest x-ray today Call us if worsening shortness of breath, fevers chills or cough  We will plan on seeing you back in 2 months or sooner if needed

## 2018-09-01 NOTE — Telephone Encounter (Signed)
Per protocol from LL will route this over to injection pool.  

## 2018-09-01 NOTE — Progress Notes (Signed)
Synopsis: Former patient of Dr. Ashok Cordia who has obstructive sleep apnea, moderate persistent asthma, allergic rhinitis, and gastroesophageal reflux disease.  Subjective:   PATIENT ID: Tamara Daugherty GENDER: female DOB: [birth year], MRN: [id number]   HPI  Chief Complaint  Patient presents with  . Follow-up    somewhat better but not 100%   She still feels weak and washed out, but feels better than before. She still can't afford Dupixent, she has applied for assistance multiple time. She says that her breathing has improved on Dupixent and she has had less wheezing.    Past Medical History:  Diagnosis Date  . Anemia    takes Iron daily  . Anxiety   . Arthritis   . Asthma    Symbicort daily and Albuterol daily as needed  . Bruises easily    d/t being on Plavix   . Complication of anesthesia    long time to wake up - referred to pulmonary after carpel tunnel procedure - for asthma and undiagnosed sleep apnea  . GERD (gastroesophageal reflux disease)    takes Protonix daily  . Headache   . Heart murmur    [facility] - [place]  . History of blood transfusion    no abnormal reaction noted  . History of colon polyps    beningn  . History of migraine    couple of wks ago was the last one  . Hyperlipidemia    takes Atorvastatin daily  . Hypertension    takes Benicar and Diltiazem daily  . Hypothyroidism    takes Synthroid daily  . Itching    takes Atarax daily as needed  . Joint pain   . Joint swelling   . Multiple allergies    takes Claritin daily as needed as well as using Flonase if needed  . Muscle spasm    takes Baclofen if needed  . Peripheral edema    takes Torsemide daily  . Pneumonia    hx of  . Primary localized osteoarthritis of left knee 06/21/2014  . Primary localized osteoarthritis of right knee 03/21/2015  . Sleep apnea    cpap  . TIA (transient ischemic attack)    takes Plavix daily;notices right side is slightly weaker than left  . Vertigo     takes Meclizine daily as needed       Review of Systems  Constitutional: Positive for malaise/fatigue. Negative for chills, diaphoresis and fever.  HENT: Positive for congestion. Negative for ear discharge and nosebleeds.   Respiratory: Positive for cough, shortness of breath and wheezing. Negative for sputum production.       Objective:  Physical Exam   Vitals:   09/01/18 1441  BP: 122/78  Pulse: 92  SpO2: 97%  Weight: (!) 309 lb 6.4 oz (140.3 kg)  Height: 5\' 6"  (1.676 m)    Gen: well appearing HENT: OP clear, TM's clear, neck supple PULM: CTA B, normal percussion CV: RRR, no mgr, trace edema GI: BS+, soft, nontender Derm: no cyanosis or rash Psyche: normal mood and affect    CBC    Component Value Date/Time   WBC 13.9 (H) 08/21/2018 1131   RBC 4.13 08/21/2018 1131   HGB 12.1 08/21/2018 1131   HCT 37.2 08/21/2018 1131   PLT 378.0 08/21/2018 1131   MCV 90.0 08/21/2018 1131   MCH 30.6 04/27/2018 1158   MCHC 32.6 08/21/2018 1131   RDW 15.7 (H) 08/21/2018 1131   LYMPHSABS 2.9 08/21/2018 1131   MONOABS 1.6 (H)  08/21/2018 1131   EOSABS 0.1 08/21/2018 1131   BASOSABS 0.0 08/21/2018 1131     PFT April 23, 2018 pulmonary function testing normal ratio, FVC 2.1L 77% predicted, total lung capacity 4.6 L 87% predicted, DLCO 18.3 mL 69% predicted 01/09/17: FVC 2.24 L (80%) FEV1 1.67 L (76%) FEV1/FVC 0.75 FEF 25-75 1.25 L (60%) negative bronchodilator response DLCO corrected 62% 09/27/16: FVC 1.92 L (69%) FEV1 1.51 L (69%) FEV1/FVC 0.79 FEF 25-75 1.37 L (66%) negative bronchodilator response TLC 3.46 L (64%) RV 71% ERV 22% DLCO corrected 66%  FENO: 04/22/2018 20 ppb   6MWT 04/21/17:  Walked 318 meters / Baseline Sat 100% on RA / Nadir Sat 95% on RA 01/03/17:  Walked 446 meters / Baseline Sat 98% on RA / Nadir Sat 87% on RA @ 5:50 (required 2 L/m with exertion to maintain) 09/27/16:  Walked 318 meters / Baseline Sat 100% on RA / Nadir Sat 93% on RA (rested for  1 min)  IMAGING HRCT CHEST W/O 01/16/17:  Mild dilation of the pulmonic trunk measuring 4.1 cm. Mild tracheobronchomalacia. Cardiomegaly noted. No bronchiectasis. No reticulation, intralobular septal thickening, or medical changes to suggest interstitial lung disease. No parenchymal mass or opacification. No pleural effusion or thickening. No pericardial effusion. No pathologic mediastinal adenopathy.   CXR PA/LAT 08/30/16 :  Borderline mild cardiomegaly. Low lung volumes. No focal opacity or effusion appreciated. Mediastinum normal in contour.  February 2020 chest x-ray showed upper lobe predominant patchy airspace disease consistent with pneumonia  CARDIAC TTE (01/16/17):  LV moderately dilated with EF 55-60%. No regional wall motion abnormalities. LA & RA normal in size. RV normal in size and function. No aortic stenosis or regurgitation. Aortic root normal in size. Mild mitral regurgitation without stenosis. Trivial pulmonic regurgitation without stenosis. Mild tricuspid regurgitation. No pericardial effusion.  LABS  April 27, 2018: BNP 44, hemoglobin 12.3, absolute eosinophil 200  08/30/16 CBC: 8.2/12.1/36.3/360 Eosinophils: 0.3 IgE: 121 RAST panel: Marginal/week positives  Multiple visits from the most recent several months reviewed where she was seen for pneumonia, also for an asthma flareup.  Treated with prednisone, Levaquin.     Assessment & Plan:   Community acquired pneumonia of right middle lobe of lung ([place]) - Plan: DG Chest 2 View  Severe persistent allergic asthma  Severe persistent asthma with exacerbation  OSA on CPAP  Discussion: This has been a stable interval for [facility] since treated for pneumonia, she has the expected symptoms of malaise and fatigue but she is not worsening.  She needs to have a repeat chest x-ray today.  She is frustrated by the cost of the biologic agent for her severe persistent asthma.  This medicine has helped her and made her have less  shortness of breath and wheezing however it is very costly.  Plan: Severe persistent asthma with eosinophilia: We will make sure we have exhausted all options to help you get financial coverage for Dupixent If there are no other options then we will change you to a drug called Berna Bue Continue taking Symbicort as you are doing Continue taking montelukast as you are doing Practice good hand hygiene Stay active Use albuterol as needed for chest tightness wheezing or shortness of breath  Pneumonia: Repeat chest x-ray today Call us if worsening shortness of breath, fevers chills or cough  We will plan on seeing you back in 2 months or sooner if needed   Current Outpatient Medications:  .  albuterol (PROVENTIL HFA;VENTOLIN HFA) 108 (90 Base) MCG/ACT inhaler,  Inhale 2 puffs into the lungs every 6 (six) hours as needed for wheezing or shortness of breath., Disp: 3 Inhaler, Rfl: 0 .  budesonide-formoterol (SYMBICORT) 160-4.5 MCG/ACT inhaler, Inhale 2 puffs into the lungs 2 (two) times daily., Disp: 1 Inhaler, Rfl: 0 .  CALCIUM-VITAMIN D PO, Take 1 tablet by mouth daily., Disp: , Rfl:  .  clopidogrel (PLAVIX) 75 MG tablet, Take 1 tablet by mouth daily., Disp: , Rfl:  .  diltiazem (MATZIM LA) 240 MG 24 hr tablet, Take 240 mg by mouth daily., Disp: , Rfl:  .  EPINEPHrine 0.3 mg/0.3 mL IJ SOAJ injection, Inject 0.3 mg into the muscle once., Disp: , Rfl:  .  fluticasone (FLONASE) 50 MCG/ACT nasal spray, Place 1 spray into both nostrils 2 (two) times daily as needed for allergies or rhinitis., Disp: , Rfl:  .  guaiFENesin (MUCINEX) 600 MG 12 hr tablet, Take 600 mg by mouth 2 (two) times daily as needed for to loosen phlegm., Disp: , Rfl:  .  HYDROcodone-homatropine (HYCODAN) 5-1.5 MG/5ML syrup, Take 5 mLs by mouth every 6 (six) hours as needed for cough., Disp: 240 mL, Rfl: 0 .  hydrOXYzine (ATARAX/VISTARIL) 25 MG tablet, Take 25 mg by mouth every 8 (eight) hours as needed for itching., Disp: , Rfl:    .  levofloxacin (LEVAQUIN) 500 MG tablet, Take 1 tablet (500 mg total) by mouth daily., Disp: 7 tablet, Rfl: 0 .  levothyroxine (SYNTHROID, LEVOTHROID) 50 MCG tablet, Take 50 mcg by mouth daily before breakfast., Disp: , Rfl:  .  loratadine (CLARITIN) 10 MG tablet, Take 10 mg by mouth daily as needed for allergies., Disp: , Rfl:  .  meclizine (ANTIVERT) 25 MG tablet, Take 25 mg by mouth 4 (four) times daily as needed for dizziness., Disp: , Rfl:  .  montelukast (SINGULAIR) 10 MG tablet, Take 1 tablet (10 mg total) by mouth at bedtime., Disp: 90 tablet, Rfl: 3 .  Multiple Vitamins-Minerals (MULTIVITAMIN WITH MINERALS) tablet, Take 1 tablet by mouth daily., Disp: , Rfl:  .  olmesartan-hydrochlorothiazide (BENICAR HCT) 40-25 MG per tablet, Take 1 tablet by mouth daily., Disp: , Rfl:  .  Omega-3 Fatty Acids (FISH OIL PO), Take 1 capsule by mouth 2 (two) times daily., Disp: , Rfl:  .  pantoprazole (PROTONIX) 40 MG tablet, Take 40 mg by mouth daily., Disp: , Rfl:  .  potassium chloride (MICRO-K) 10 MEQ CR capsule, Take 30 mEq by mouth daily. , Disp: , Rfl:  .  predniSONE (DELTASONE) 10 MG tablet, Take 1 tablet by mouth daily., Disp: , Rfl:  .  Respiratory Therapy Supplies (FLUTTER) DEVI, Use flutter valve 3 times a day, Disp: 1 each, Rfl: 0 .  simvastatin (ZOCOR) 20 MG tablet, Take 20 mg by mouth daily., Disp: , Rfl:  .  Spacer/Aero-Holding Chambers (AEROCHAMBER MV) inhaler, Use as instructed, Disp: 1 each, Rfl: 0 .  torsemide (DEMADEX) 20 MG tablet, Take 20 mg by mouth as needed. , Disp: , Rfl:  .  TURMERIC PO, Take 1 capsule by mouth daily., Disp: , Rfl:  .  predniSONE (DELTASONE) 10 MG tablet, As needed for an acute asthma exacerbation- take 4 tabs po daily x 2 days; then 3 tabs for 2 days; then 2 tabs for 2 days; then 1 tab for 2 days (Patient not taking: Reported on 09/01/2018), Disp: 20 tablet, Rfl: 0 .  pyridOXINE (B-6) 50 MG tablet, Take 50 mg by mouth daily., Disp: , Rfl:

## 2018-09-01 NOTE — Telephone Encounter (Signed)
After discussion with patient and Dr. Lake Bells, we will be switching patient to [facility].  Have the fasenra paperwork already signed by the patient.  Will complete and send enrolment.

## 2018-09-03 NOTE — Telephone Encounter (Signed)
Please advise if there is an update on this for pt. Thanks!

## 2018-09-07 NOTE — Telephone Encounter (Signed)
Please advise on update, thank you.

## 2018-09-09 NOTE — Telephone Encounter (Signed)
Enrollment forms have been received back from Dr. Lake Bells. These have been faxed along with the pt's insurance cards to [phone]. Will await summary of benefits.

## 2018-09-10 ENCOUNTER — Ambulatory Visit: Payer: PRIVATE HEALTH INSURANCE | Admitting: Pulmonary Disease

## 2018-09-11 ENCOUNTER — Telehealth: Payer: Self-pay | Admitting: Pulmonary Disease

## 2018-09-11 NOTE — Telephone Encounter (Signed)
Spoke with Darlyn Chamber at [employer]  He is asking for pt's ID number on her [employer] insurance card  I gave him the number verbally and he states nothing further needed

## 2018-09-15 NOTE — Telephone Encounter (Signed)
Patient is wanting to know when she is going to receive her injection or if she has been approved. She is starting to fill a difference since being off the [place]. Please let her know any updates regarding her Berna Bue. Thank you  Routing to injection pool

## 2018-09-21 NOTE — Telephone Encounter (Signed)
We received a fax from [place] that they were missing information on the enrollment form. Called [employer] and was told that this fax was a mistake. They are going to refax the summary of benefits to my attention.

## 2018-09-22 NOTE — Telephone Encounter (Signed)
Received a fax from [place]. Pt has been approved for 100% of the cost of medication. Meds need to be ordered.

## 2018-09-23 MED ORDER — DUPILUMAB 300 MG/2ML ~~LOC~~ SOSY
300.0000 mg | PREFILLED_SYRINGE | Freq: Once | SUBCUTANEOUS | Status: AC
  Administered 2018-09-23: 300 mg via SUBCUTANEOUS

## 2018-09-23 NOTE — Addendum Note (Signed)
Addended by: Alroy Bailiff B on: 09/23/2018 11:52 AM   Modules accepted: Orders

## 2018-09-28 ENCOUNTER — Telehealth: Payer: Self-pay | Admitting: *Deleted

## 2018-09-28 NOTE — Telephone Encounter (Signed)
1 prefilled syringe Ordered date: 09/28/2018 Shipping date: 09/28/2018

## 2018-09-28 NOTE — Telephone Encounter (Addendum)
Dr. Lake Bells switched pt from [place] ([place] would not approve med.) to [place]. Ordered Fasenra from [place], med. will be here 09/29/2018. Nothing further needed.

## 2018-09-29 NOTE — Telephone Encounter (Signed)
Arrival Date:09/29/2018 Lot #: [id number] Exp date: 12/2019  Berna Bue rc'd 09/29/2018

## 2018-09-30 ENCOUNTER — Ambulatory Visit (INDEPENDENT_AMBULATORY_CARE_PROVIDER_SITE_OTHER): Payer: Medicare Other

## 2018-09-30 ENCOUNTER — Telehealth: Payer: Self-pay | Admitting: Pulmonary Disease

## 2018-09-30 ENCOUNTER — Other Ambulatory Visit: Payer: Self-pay

## 2018-09-30 DIAGNOSIS — J4550 Severe persistent asthma, uncomplicated: Secondary | ICD-10-CM | POA: Diagnosis not present

## 2018-09-30 MED ORDER — BENRALIZUMAB 30 MG/ML ~~LOC~~ SOSY
30.0000 mg | PREFILLED_SYRINGE | Freq: Once | SUBCUTANEOUS | Status: AC
  Administered 2018-09-30: 30 mg via SUBCUTANEOUS

## 2018-09-30 NOTE — Telephone Encounter (Signed)
Patient presented to the office today for first-time Fasenra injection. No sx other than dry allergy cough.  Primary Pulmonologist: Simonne Maffucci MD Medication name: Berna Bue Strength: 30mg  Site(s): Left arm  Epi pen/Auvi-Q visible during appointment: Yes (Epi-Pen)  Time of injection: 10:20  Patient evaluated every 15-20 minutes per protocol x2 hours.  1st check: 10:44AM Evaluation: Pinhead size local reaction, red. No sx. Explained to pt. That she could have a reaction up to 48 hrs. After her inj.. Pt understood.  2nd check: 11:04 AM Evaluation: Pin head size local reaction is still a little red. No sx.   3rd check: 11:34 AM I had another pt. Waiting, it wasn't time to check on her at that point. Checked on her as soon as pt. Left. Evaluation: Local reaction is fading. No sx, still.  4th check: 11:49 AM Evaluation: No sx, local reaction is still fading.  5th check: 12:04PM Evaluation: No sx, local reaction, I could barely see it.  6th check: 12:20PM Evaluation: Local reaction is gone. No sx. Pt felt no different than when she came in.  7th check: N/APM Evaluation:  8th check: N/APM Evaluation:

## 2018-09-30 NOTE — Progress Notes (Signed)
Have you been hospitalized within the last 10 days?  No Do you have a fever?  No Do you have a cough?  Yes allergy cough Do you have a headache or sore throat? No

## 2018-10-08 ENCOUNTER — Encounter: Payer: Self-pay | Admitting: Pulmonary Disease

## 2018-10-14 ENCOUNTER — Ambulatory Visit: Payer: PRIVATE HEALTH INSURANCE | Admitting: Pulmonary Disease

## 2018-10-23 ENCOUNTER — Telehealth: Payer: Self-pay | Admitting: Pulmonary Disease

## 2018-10-26 NOTE — Telephone Encounter (Signed)
LVM for patient to return call. X1 Pt is needing to schedule her fasnera injection, checking to see if medication is in office.

## 2018-10-26 NOTE — Telephone Encounter (Signed)
We do not have medication in stock for the pt. This will need to be ordered.  Berna Bue Order: 30mg  #1 prefilled syringe Ordered date: 1 Expected date of arrival: 10/27/2018 Ordered by: Desmond Dike, [place]: [facility] with pt. She has been scheduled for her injection on 10/28/2018 at 10:45am. Will await shipment of medication.

## 2018-10-27 NOTE — Telephone Encounter (Signed)
Berna Bue Shipment Received:  30mg  #1 prefilled syringe Medication arrival Date: 10/27/2018 Lot #: [id number] Medication exp date: 12/2019 Received by: Desmond Dike, CMA

## 2018-10-28 ENCOUNTER — Ambulatory Visit (INDEPENDENT_AMBULATORY_CARE_PROVIDER_SITE_OTHER): Payer: Medicare Other | Admitting: Primary Care

## 2018-10-28 ENCOUNTER — Ambulatory Visit (INDEPENDENT_AMBULATORY_CARE_PROVIDER_SITE_OTHER): Payer: Medicare Other

## 2018-10-28 ENCOUNTER — Ambulatory Visit: Payer: PRIVATE HEALTH INSURANCE | Admitting: Pulmonary Disease

## 2018-10-28 ENCOUNTER — Encounter: Payer: Self-pay | Admitting: Primary Care

## 2018-10-28 ENCOUNTER — Other Ambulatory Visit: Payer: Self-pay

## 2018-10-28 VITALS — BP 136/84 | HR 76 | Temp 98.1°F | Ht 66.0 in | Wt 313.0 lb

## 2018-10-28 DIAGNOSIS — J4551 Severe persistent asthma with (acute) exacerbation: Secondary | ICD-10-CM | POA: Diagnosis not present

## 2018-10-28 DIAGNOSIS — R6889 Other general symptoms and signs: Secondary | ICD-10-CM | POA: Insufficient documentation

## 2018-10-28 DIAGNOSIS — J984 Other disorders of lung: Secondary | ICD-10-CM

## 2018-10-28 DIAGNOSIS — J4550 Severe persistent asthma, uncomplicated: Secondary | ICD-10-CM

## 2018-10-28 DIAGNOSIS — R011 Cardiac murmur, unspecified: Secondary | ICD-10-CM

## 2018-10-28 DIAGNOSIS — J302 Other seasonal allergic rhinitis: Secondary | ICD-10-CM | POA: Diagnosis not present

## 2018-10-28 LAB — CBC WITH DIFFERENTIAL/PLATELET
Basophils Absolute: 0 10*3/uL (ref 0.0–0.1)
Basophils Relative: 0.7 % (ref 0.0–3.0)
Eosinophils Absolute: 0 10*3/uL (ref 0.0–0.7)
Eosinophils Relative: 0.1 % (ref 0.0–5.0)
HCT: 37 % (ref 36.0–46.0)
Hemoglobin: 12.3 g/dL (ref 12.0–15.0)
Lymphocytes Relative: 40.1 % (ref 12.0–46.0)
Lymphs Abs: 2.6 10*3/uL (ref 0.7–4.0)
MCHC: 33.3 g/dL (ref 30.0–36.0)
MCV: 91.8 fl (ref 78.0–100.0)
Monocytes Absolute: 0.8 10*3/uL (ref 0.1–1.0)
Monocytes Relative: 12.5 % — ABNORMAL HIGH (ref 3.0–12.0)
Neutro Abs: 3.1 10*3/uL (ref 1.4–7.7)
Neutrophils Relative %: 46.6 % (ref 43.0–77.0)
Platelets: 324 10*3/uL (ref 150.0–400.0)
RBC: 4.03 Mil/uL (ref 3.87–5.11)
RDW: 17.1 % — ABNORMAL HIGH (ref 11.5–15.5)
WBC: 6.5 10*3/uL (ref 4.0–10.5)

## 2018-10-28 MED ORDER — BUDESONIDE-FORMOTEROL FUMARATE 160-4.5 MCG/ACT IN AERO: 2.0000 | INHALATION_SPRAY | Freq: Two times a day (BID) | RESPIRATORY_TRACT | 0 refills | Status: DC

## 2018-10-28 MED ORDER — BENRALIZUMAB 30 MG/ML ~~LOC~~ SOSY
30.0000 mg | PREFILLED_SYRINGE | Freq: Once | SUBCUTANEOUS | Status: AC
  Administered 2018-10-28: 30 mg via SUBCUTANEOUS

## 2018-10-28 NOTE — Progress Notes (Signed)
Have you been hospitalized within the last 10 days?  No Do you have a fever?  No Do you have a cough?  No Do you have a headache or sore throat? No  

## 2018-10-28 NOTE — Assessment & Plan Note (Addendum)
-   Stable interval, no recent exacerbation - Newly on Fasenra 30mg  q 4 weeks, received second injection today  - Reports some initial benefit from biologic  - Continue Symbicort 160 2bid, prn albuterol q4-6 hours for sob/wheezing - CBC with diff showed Eosinophil absolute 0.0 today (0.3 in early february)

## 2018-10-28 NOTE — Assessment & Plan Note (Signed)
-   Continue Loratadine, Singulair and floanse

## 2018-10-28 NOTE — Assessment & Plan Note (Signed)
-   Previous flu like illness in Feb - Check SARS/CoV2 serology

## 2018-10-28 NOTE — Progress Notes (Addendum)
@Patient  ID: Tamara Daugherty, female    DOB: 09-Nov-1950, 68 y.o.   MRN: [id number]  Chief Complaint  Patient presents with  . Follow-up    SOB with exertion, cough, little pm chest tightness and wheeze    Referring provider: Eber Hong, MD  HPI: 68 year old female, passive smoke exposure. PMH severe persistent asthma with eosinophilia, CAP, OSA on CPAP, GERD, obesity. Patient of Dr. Lake Bells, last seen on 09/01/18. Plan to exhaust all options to help get financial coverage for Dupixent, if no other options will change to [place]. Maintained on Symbicort and montelukast.   10/28/2018 Patient presents today for 2 month follow-up visit. Currently receiving Fasenra 30mg  q 4 weeks, received her second injection today. Felt improvement after first injection. States that she was able to work outside in the yard. Started to tell it was wearing off the last week or so. Wheezing a little more. Has prescription prednisone to have on hand. Continues taking singulair, loratadine and flonase nasal spray. Thinks she may have had Covid back in February, she has been free from symptoms for over 6 weeks. Discussed limitations to antibody testing and would like to proceed.   Allergies  Allergen Reactions  . Bee Venom Anaphylaxis  . Diphenhydramine Hcl Other (See Comments)    Paradoxical reaction/ becomes hyper for days  . Penicillins Hives    No reaction to Ancef.  OK to give.    Marland Kitchen Percocet [Oxycodone-Acetaminophen] Other (See Comments)    hallucination  . Propoxyphene   . Sulfa Antibiotics Itching    Immunization History  Administered Date(s) Administered  . Influenza Split 06/06/1999, 05/10/2000  . Zoster Recombinat (Shingrix) 09/01/2017, 11/21/2017    Past Medical History:  Diagnosis Date  . Anemia    takes Iron daily  . Anxiety   . Arthritis   . Asthma    Symbicort daily and Albuterol daily as needed  . Bruises easily    d/t being on Plavix   . Complication of anesthesia    long time to  wake up - referred to pulmonary after carpel tunnel procedure - for asthma and undiagnosed sleep apnea  . GERD (gastroesophageal reflux disease)    takes Protonix daily  . Headache   . Heart murmur    [place]  . History of blood transfusion    no abnormal reaction noted  . History of colon polyps    beningn  . History of migraine    couple of wks ago was the last one  . Hyperlipidemia    takes Atorvastatin daily  . Hypertension    takes Benicar and Diltiazem daily  . Hypothyroidism    takes Synthroid daily  . Itching    takes Atarax daily as needed  . Joint pain   . Joint swelling   . Multiple allergies    takes Claritin daily as needed as well as using Flonase if needed  . Muscle spasm    takes Baclofen if needed  . Peripheral edema    takes Torsemide daily  . Pneumonia    hx of  . Primary localized osteoarthritis of left knee 06/21/2014  . Primary localized osteoarthritis of right knee 03/21/2015  . Sleep apnea    cpap  . TIA (transient ischemic attack)    takes Plavix daily;notices right side is slightly weaker than left  . Vertigo    takes Meclizine daily as needed    Tobacco History: Social History   Tobacco Use  Smoking Status Passive Smoke Exposure - Never Smoker  Smokeless Tobacco Never Used  Tobacco Comment   Father smoked briefly.    Counseling given: Not Answered Comment: Father smoked briefly.    Outpatient Medications Prior to Visit  Medication Sig Dispense Refill  . albuterol (PROVENTIL HFA;VENTOLIN HFA) 108 (90 Base) MCG/ACT inhaler Inhale 2 puffs into the lungs every 6 (six) hours as needed for wheezing or shortness of breath. 3 Inhaler 0  . budesonide-formoterol (SYMBICORT) 160-4.5 MCG/ACT inhaler Inhale 2 puffs into the lungs 2 (two) times daily. 2 Inhaler 0  . CALCIUM-VITAMIN D PO Take 1 tablet by mouth daily.    . clopidogrel (PLAVIX) 75 MG tablet Take 1 tablet by mouth daily.    Marland Kitchen diltiazem (MATZIM LA) 240 MG 24 hr  tablet Take 240 mg by mouth daily.    Marland Kitchen EPINEPHrine 0.3 mg/0.3 mL IJ SOAJ injection Inject 0.3 mg into the muscle once.    . fluticasone (FLONASE) 50 MCG/ACT nasal spray Place 1 spray into both nostrils 2 (two) times daily as needed for allergies or rhinitis.    Marland Kitchen guaiFENesin (MUCINEX) 600 MG 12 hr tablet Take 600 mg by mouth 2 (two) times daily as needed for to loosen phlegm.    Marland Kitchen levothyroxine (SYNTHROID, LEVOTHROID) 50 MCG tablet Take 50 mcg by mouth daily before breakfast.    . loratadine (CLARITIN) 10 MG tablet Take 10 mg by mouth daily as needed for allergies.    . montelukast (SINGULAIR) 10 MG tablet Take 1 tablet (10 mg total) by mouth at bedtime. 90 tablet 3  . Multiple Vitamins-Minerals (MULTIVITAMIN WITH MINERALS) tablet Take 1 tablet by mouth daily.    Marland Kitchen olmesartan-hydrochlorothiazide (BENICAR HCT) 40-25 MG per tablet Take 1 tablet by mouth daily.    . Omega-3 Fatty Acids (FISH OIL PO) Take 1 capsule by mouth 2 (two) times daily.    . pantoprazole (PROTONIX) 40 MG tablet Take 40 mg by mouth daily.    . potassium chloride (MICRO-K) 10 MEQ CR capsule Take 30 mEq by mouth daily.     Marland Kitchen pyridOXINE (B-6) 50 MG tablet Take 50 mg by mouth daily.    Marland Kitchen Respiratory Therapy Supplies (FLUTTER) DEVI Use flutter valve 3 times a day 1 each 0  . simvastatin (ZOCOR) 20 MG tablet Take 20 mg by mouth daily.    Marland Kitchen Spacer/Aero-Holding Chambers (AEROCHAMBER MV) inhaler Use as instructed 1 each 0  . torsemide (DEMADEX) 20 MG tablet Take 20 mg by mouth as needed.     . TURMERIC PO Take 1 capsule by mouth daily.    Marland Kitchen levofloxacin (LEVAQUIN) 500 MG tablet Take 1 tablet (500 mg total) by mouth daily. 7 tablet 0  . meclizine (ANTIVERT) 25 MG tablet Take 25 mg by mouth 4 (four) times daily as needed for dizziness.    . predniSONE (DELTASONE) 10 MG tablet As needed for an acute asthma exacerbation- take 4 tabs po daily x 2 days; then 3 tabs for 2 days; then 2 tabs for 2 days; then 1 tab for 2 days 20 tablet 0  .  HYDROcodone-homatropine (HYCODAN) 5-1.5 MG/5ML syrup Take 5 mLs by mouth every 6 (six) hours as needed for cough. (Patient not taking: Reported on 10/28/2018) 240 mL 0  . hydrOXYzine (ATARAX/VISTARIL) 25 MG tablet Take 25 mg by mouth every 8 (eight) hours as needed for itching.    . predniSONE (DELTASONE) 10 MG tablet Take 1 tablet by mouth daily.     No facility-administered  medications prior to visit.     Review of Systems  Review of Systems  Constitutional: Negative.   HENT: Negative.   Respiratory: Positive for cough, shortness of breath and wheezing.   Cardiovascular: Negative.    Physical Exam  BP 136/84 (BP Location: Right Arm, Cuff Size: Large)   Pulse 76   Temp 98.1 F (36.7 C)   Ht 5\' 6"  (1.676 m)   Wt (!) 313 lb (142 kg)   SpO2 100%   BMI 50.52 kg/m  Physical Exam Constitutional:      Appearance: Normal appearance. She is not ill-appearing.  HENT:     Head: Normocephalic and atraumatic.     Right Ear: Tympanic membrane normal.     Left Ear: Tympanic membrane normal.     Mouth/Throat:     Mouth: Mucous membranes are moist.     Pharynx: Oropharynx is clear.  Neck:     Musculoskeletal: Normal range of motion and neck supple.  Cardiovascular:     Rate and Rhythm: Normal rate and regular rhythm.     Comments: No edema Pulmonary:     Effort: Pulmonary effort is normal.     Breath sounds: Normal breath sounds. No wheezing or rhonchi.  Musculoskeletal: Normal range of motion.  Skin:    General: Skin is warm and dry.  Neurological:     Mental Status: She is alert.  Psychiatric:        Mood and Affect: Mood normal.        Behavior: Behavior normal.        Thought Content: Thought content normal.        Judgment: Judgment normal.      Lab Results:  CBC    Component Value Date/Time   WBC 6.5 10/28/2018 1148   RBC 4.03 10/28/2018 1148   HGB 12.3 10/28/2018 1148   HCT 37.0 10/28/2018 1148   PLT 324.0 10/28/2018 1148   MCV 91.8 10/28/2018 1148   MCH 30.6  04/27/2018 1158   MCHC 33.3 10/28/2018 1148   RDW 17.1 (H) 10/28/2018 1148   LYMPHSABS 2.6 10/28/2018 1148   MONOABS 0.8 10/28/2018 1148   EOSABS 0.0 10/28/2018 1148   BASOSABS 0.0 10/28/2018 1148    BMET    Component Value Date/Time   NA 133 (L) 08/21/2018 1131   K 3.6 08/21/2018 1131   CL 94 (L) 08/21/2018 1131   CO2 32 08/21/2018 1131   GLUCOSE 98 08/21/2018 1131   BUN 15 08/21/2018 1131   CREATININE 1.06 08/21/2018 1131   CALCIUM 8.9 08/21/2018 1131   GFRNONAA 39 (L) 03/22/2015 0625   GFRAA 45 (L) 03/22/2015 0625    BNP    Component Value Date/Time   BNP 44.0 04/27/2018 1158    ProBNP No results found for: PROBNP  Imaging: No results found.   Assessment & Plan:   Severe persistent allergic asthma - Stable interval, no recent exacerbation - Newly on Fasenra 30mg  q 4 weeks, received second injection today  - Reports some initial benefit from biologic  - Continue Symbicort 160 2bid, prn albuterol q4-6 hours for sob/wheezing - CBC with diff showed Eosinophil absolute 0.0 today (0.3 in early february)  Chronic seasonal allergic rhinitis - Continue Loratadine, Singulair and floanse   Flu-like symptoms - Previous flu like illness in Feb - Check SARS/CoV2 serology   FU in 2-3 months with MD/NP or sooner if needed  Martyn Ehrich, NP 10/28/2018

## 2018-10-28 NOTE — Patient Instructions (Addendum)
Continue Symbicort - two puffs twice daily Continue Fasenra injections- received today  Continue Singulair, claritin and flonase Use Albuterol rescue inhaler every 4-6 hours as needed for shortness of breath/wheezing Prednisone taper to have on hand in case of asthma exacerbation  Labs today (covid serology) Follow up in July or August with MD/NP or sooner if needed

## 2018-10-29 LAB — SAR COV2 SEROLOGY (COVID19)AB(IGG),IA: SARS CoV2 AB IGG: NEGATIVE

## 2018-10-29 NOTE — Progress Notes (Signed)
Wbc with diff showed improvement in her eosinophils with injections. Covid serology was negative

## 2018-10-29 NOTE — Progress Notes (Signed)
Reviewed, agree 

## 2018-10-30 MED ORDER — BENRALIZUMAB 30 MG/ML ~~LOC~~ SOSY
30.0000 mg | PREFILLED_SYRINGE | Freq: Once | SUBCUTANEOUS | Status: AC
  Administered 2018-10-28: 16:00:00 30 mg via SUBCUTANEOUS

## 2018-10-30 MED ORDER — BENRALIZUMAB 30 MG/ML ~~LOC~~ SOSY
30.0000 mg | PREFILLED_SYRINGE | Freq: Once | SUBCUTANEOUS | Status: AC
  Administered 2018-10-28: 30 mg via SUBCUTANEOUS

## 2018-10-30 NOTE — Addendum Note (Signed)
Addended by: Alroy Bailiff B on: 10/30/2018 04:02 PM   Modules accepted: Orders

## 2018-10-30 NOTE — Addendum Note (Signed)
Addended by: Alroy Bailiff B on: 10/30/2018 04:11 PM   Modules accepted: Orders

## 2018-11-03 MED ORDER — DOXYCYCLINE HYCLATE 100 MG PO TABS: 100.0000 mg | ORAL_TABLET | Freq: Two times a day (BID) | ORAL | 0 refills | Status: DC

## 2018-11-03 NOTE — Telephone Encounter (Signed)
Sent in RX for doxycycyline for sinusitis. Tylenol as needed for pain. Warm compresses to sinuses. Saline rinses to sinuses.

## 2018-11-03 NOTE — Telephone Encounter (Signed)
She has an allergy to PCN and can't take doxycycline. I would hold off on abx at this time then. How's her breathing? Enc tylenol, warm compresses and sinus rinses. If develops fever or purulent sputum let us know.

## 2018-11-17 ENCOUNTER — Telehealth: Payer: Self-pay | Admitting: Pulmonary Disease

## 2018-11-17 NOTE — Telephone Encounter (Signed)
Berna Bue Order: 30mg  #1 prefilled syringe Ordered date: 11/17/2018 Expected date of arrival: 11/18/2018 Ordered by: Desmond Dike, [place]: [facility]

## 2018-11-18 NOTE — Telephone Encounter (Signed)
Fasenra Shipment Received:  30mg  #1 prefilled syringe Medication arrival date: 11/18/2018 Lot #: [id number] Exp date: 12/2019 Received by: TBS

## 2018-11-25 ENCOUNTER — Ambulatory Visit (INDEPENDENT_AMBULATORY_CARE_PROVIDER_SITE_OTHER): Payer: Medicare Other

## 2018-11-25 ENCOUNTER — Other Ambulatory Visit: Payer: Self-pay

## 2018-11-25 DIAGNOSIS — J4551 Severe persistent asthma with (acute) exacerbation: Secondary | ICD-10-CM | POA: Diagnosis not present

## 2018-11-25 MED ORDER — BENRALIZUMAB 30 MG/ML ~~LOC~~ SOSY
30.0000 mg | PREFILLED_SYRINGE | Freq: Once | SUBCUTANEOUS | Status: AC
  Administered 2018-11-25: 30 mg via SUBCUTANEOUS

## 2018-11-25 NOTE — Progress Notes (Signed)
Have you been hospitalized within the last 10 days?  No Do you have a fever?  No Do you have a cough?  Yes a little cough, in the mornings drainage. Do you have a headache or sore throat? No

## 2018-12-24 DIAGNOSIS — Z0189 Encounter for other specified special examinations: Secondary | ICD-10-CM | POA: Diagnosis not present

## 2018-12-24 DIAGNOSIS — Z03818 Encounter for observation for suspected exposure to other biological agents ruled out: Secondary | ICD-10-CM | POA: Diagnosis not present

## 2019-01-07 MED ORDER — PREDNISONE 10 MG PO TABS: ORAL_TABLET | ORAL | 0 refills | Status: DC

## 2019-01-07 NOTE — Telephone Encounter (Signed)
Needs prednisone taper, will send in RX

## 2019-01-08 ENCOUNTER — Telehealth: Payer: Self-pay | Admitting: *Deleted

## 2019-01-08 NOTE — Telephone Encounter (Signed)
Tamara Daugherty Order: 30mg  #1 prefilled syringe Ordered date: 01/08/2019 Expected date of arrival: 01/11/2019 Ordered by: Desmond Dike, [place]

## 2019-01-11 NOTE — Telephone Encounter (Signed)
Fasenra Shipment Received:  30mg  #1 prefilled syringe Medication arrival date: 01/11/19 Lot #: [id number] Exp date: 03-2020 Received by: Suzi Roots

## 2019-01-19 ENCOUNTER — Other Ambulatory Visit: Payer: Self-pay

## 2019-01-19 MED ORDER — BUDESONIDE-FORMOTEROL FUMARATE 160-4.5 MCG/ACT IN AERO: 2.0000 | INHALATION_SPRAY | Freq: Two times a day (BID) | RESPIRATORY_TRACT | 0 refills | Status: DC

## 2019-01-20 ENCOUNTER — Other Ambulatory Visit: Payer: Self-pay

## 2019-01-20 ENCOUNTER — Ambulatory Visit (INDEPENDENT_AMBULATORY_CARE_PROVIDER_SITE_OTHER): Payer: Medicare Other

## 2019-01-20 DIAGNOSIS — J4551 Severe persistent asthma with (acute) exacerbation: Secondary | ICD-10-CM

## 2019-01-20 MED ORDER — BENRALIZUMAB 30 MG/ML ~~LOC~~ SOSY
30.0000 mg | PREFILLED_SYRINGE | Freq: Once | SUBCUTANEOUS | Status: AC
  Administered 2019-01-20: 30 mg via SUBCUTANEOUS

## 2019-01-20 NOTE — Progress Notes (Signed)
All questions were answered by the patient before medication was administered. Have you been hospitalized in the last 10 days? No Do you have a fever? No Do you have a cough? No Do you have a headache or sore throat? No  

## 2019-01-25 DIAGNOSIS — Z7902 Long term (current) use of antithrombotics/antiplatelets: Secondary | ICD-10-CM | POA: Diagnosis not present

## 2019-01-25 DIAGNOSIS — R7303 Prediabetes: Secondary | ICD-10-CM | POA: Diagnosis not present

## 2019-01-25 DIAGNOSIS — E782 Mixed hyperlipidemia: Secondary | ICD-10-CM | POA: Diagnosis not present

## 2019-01-25 DIAGNOSIS — N183 Chronic kidney disease, stage 3 (moderate): Secondary | ICD-10-CM | POA: Diagnosis not present

## 2019-01-25 DIAGNOSIS — E871 Hypo-osmolality and hyponatremia: Secondary | ICD-10-CM | POA: Diagnosis not present

## 2019-01-25 DIAGNOSIS — I10 Essential (primary) hypertension: Secondary | ICD-10-CM | POA: Diagnosis not present

## 2019-01-25 DIAGNOSIS — D508 Other iron deficiency anemias: Secondary | ICD-10-CM | POA: Diagnosis not present

## 2019-01-25 DIAGNOSIS — E039 Hypothyroidism, unspecified: Secondary | ICD-10-CM | POA: Diagnosis not present

## 2019-01-25 DIAGNOSIS — E876 Hypokalemia: Secondary | ICD-10-CM | POA: Diagnosis not present

## 2019-01-25 DIAGNOSIS — Z79899 Other long term (current) drug therapy: Secondary | ICD-10-CM | POA: Diagnosis not present

## 2019-01-27 DIAGNOSIS — I10 Essential (primary) hypertension: Secondary | ICD-10-CM | POA: Diagnosis not present

## 2019-01-27 DIAGNOSIS — E039 Hypothyroidism, unspecified: Secondary | ICD-10-CM | POA: Diagnosis not present

## 2019-01-27 DIAGNOSIS — Z0000 Encounter for general adult medical examination without abnormal findings: Secondary | ICD-10-CM | POA: Diagnosis not present

## 2019-01-27 DIAGNOSIS — E782 Mixed hyperlipidemia: Secondary | ICD-10-CM | POA: Diagnosis not present

## 2019-01-27 DIAGNOSIS — J4540 Moderate persistent asthma, uncomplicated: Secondary | ICD-10-CM | POA: Diagnosis not present

## 2019-02-01 ENCOUNTER — Telehealth: Payer: Self-pay | Admitting: Primary Care

## 2019-02-01 NOTE — Telephone Encounter (Signed)
Called patient she states she is short of breath. She has taken her abluterol inhaler, she has not taken her nebulizer, she never took the Prednisone BW RXd for her last month (01/07/19) and wants to know if she should start it now.   [name] please advise.

## 2019-02-01 NOTE — Telephone Encounter (Signed)
Spoke with the pt and notified of recs per [name]  She verbalized understanding  Nothing further needed 

## 2019-02-01 NOTE — Telephone Encounter (Signed)
yes

## 2019-03-08 ENCOUNTER — Telehealth: Payer: Self-pay | Admitting: Pulmonary Disease

## 2019-03-08 NOTE — Telephone Encounter (Signed)
Berna Bue Order: 30mg  #1 prefilled syringe Ordered date: 03/08/2019 Expected date of arrival: 03/09/2019 Ordered by: Desmond Dike, [place]  [facility]ty [facility]: [facility]

## 2019-03-09 NOTE — Telephone Encounter (Signed)
Fasenra Shipment Received:  30mg  #1 prefilled syringe Medication arrival date: 03/09/19 Lot #: [id number] Exp date: 03/24/2020 Received by: Elliot Dally

## 2019-03-16 ENCOUNTER — Ambulatory Visit (INDEPENDENT_AMBULATORY_CARE_PROVIDER_SITE_OTHER): Payer: Medicare Other

## 2019-03-16 ENCOUNTER — Telehealth: Payer: Self-pay | Admitting: Primary Care

## 2019-03-16 ENCOUNTER — Other Ambulatory Visit: Payer: Self-pay

## 2019-03-16 DIAGNOSIS — J4551 Severe persistent asthma with (acute) exacerbation: Secondary | ICD-10-CM

## 2019-03-16 MED ORDER — BUDESONIDE-FORMOTEROL FUMARATE 160-4.5 MCG/ACT IN AERO: 2.0000 | INHALATION_SPRAY | Freq: Two times a day (BID) | RESPIRATORY_TRACT | 0 refills | Status: DC

## 2019-03-16 MED ORDER — BENRALIZUMAB 30 MG/ML ~~LOC~~ SOSY
30.0000 mg | PREFILLED_SYRINGE | Freq: Once | SUBCUTANEOUS | Status: AC
  Administered 2019-03-16: 30 mg via SUBCUTANEOUS

## 2019-03-16 NOTE — Telephone Encounter (Signed)
Pt was given samples at the time of injection. Nothing further needed at this time.

## 2019-03-16 NOTE — Progress Notes (Signed)
Have you been hospitalized within the last 10 days?  No Do you have a fever?  No Do you have a cough?  No Do you have a headache or sore throat? No Do you have your Epi Pen visible and is it within date?  Yes 

## 2019-03-22 IMAGING — DX DG CHEST 2V
2 series · 2 of 2 positions shown · non-contrast
Comparison: 03/26/2018 chest radiograph

CLINICAL DATA: 67 y/o  F; cough/wheeze with asthma.

EXAM:
CHEST - 2 VIEW

[chest pa]
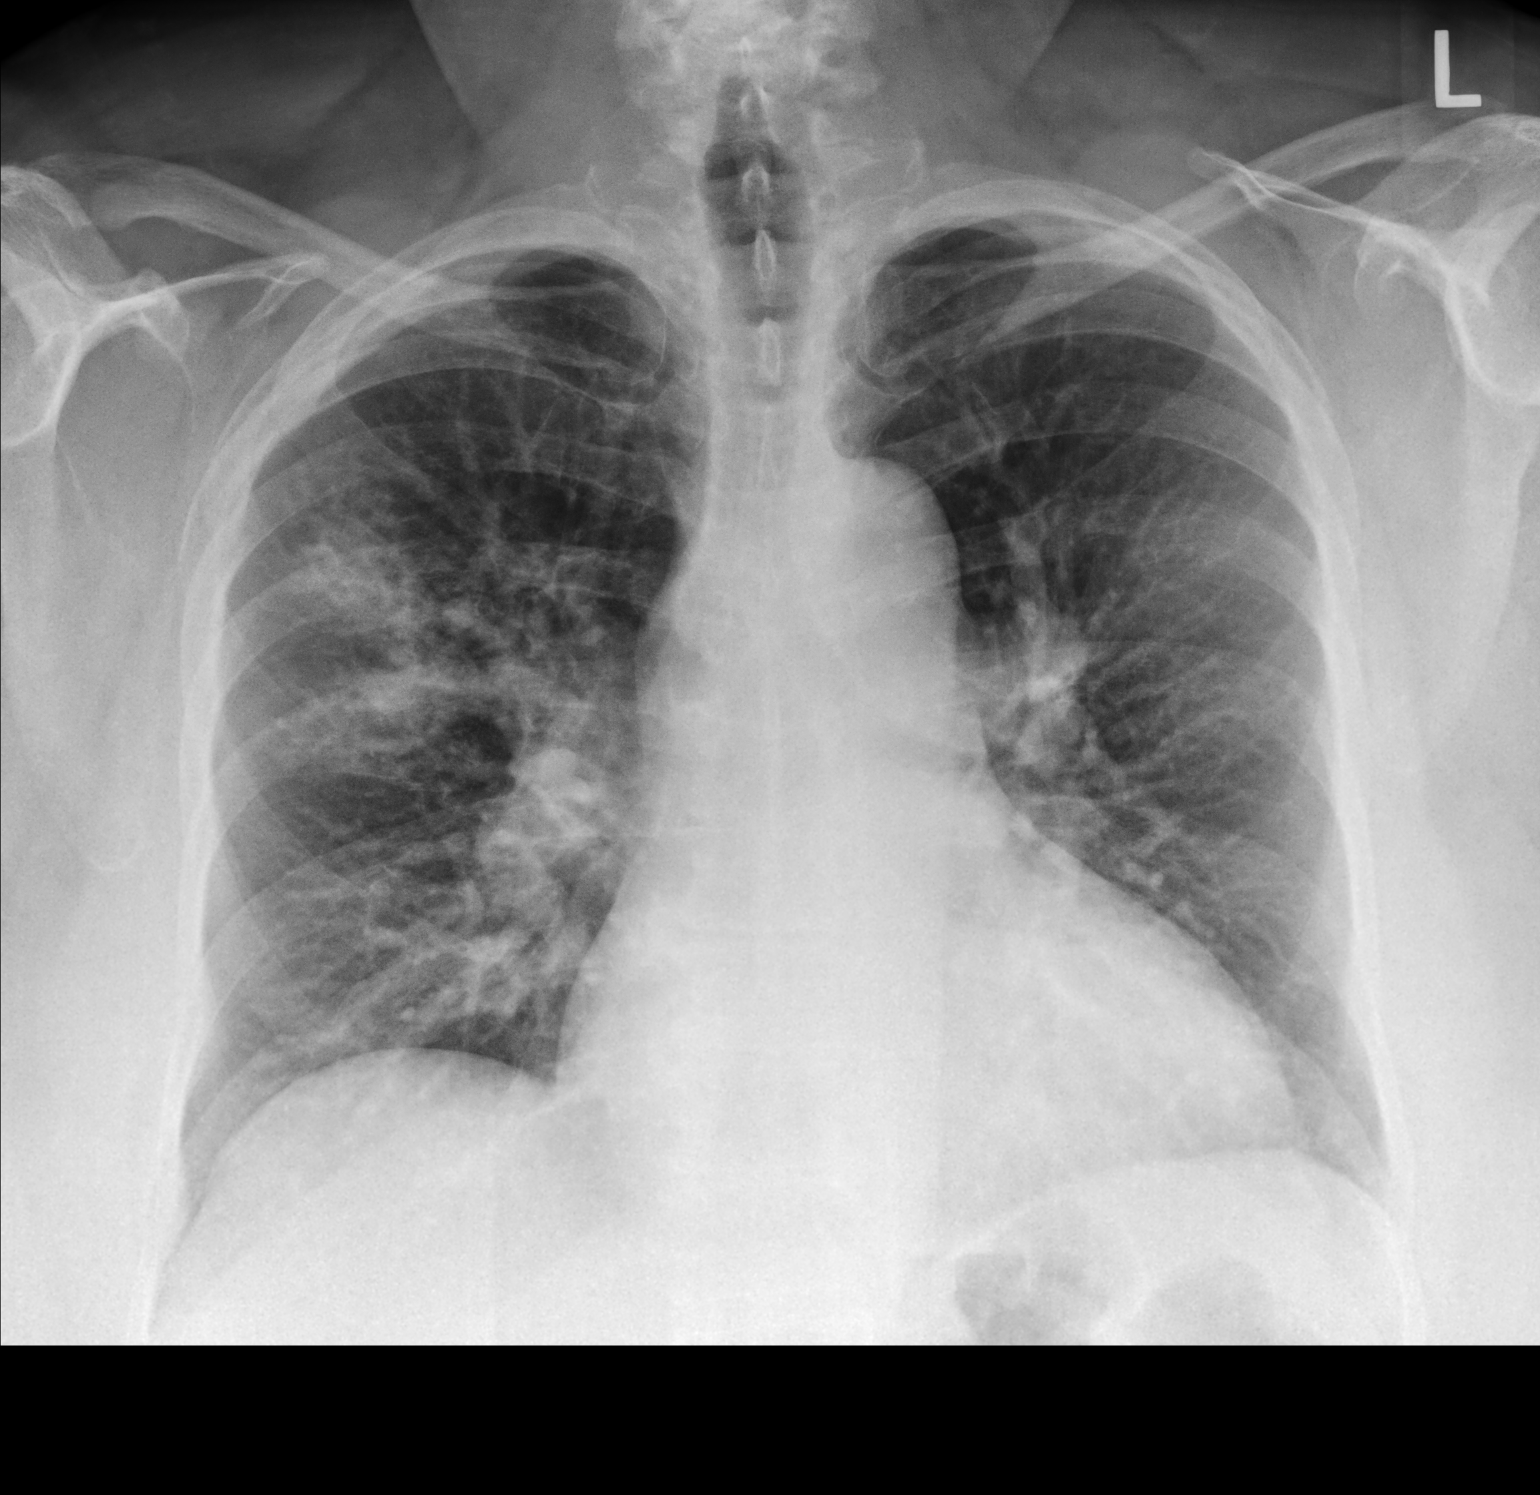

[chest lat]
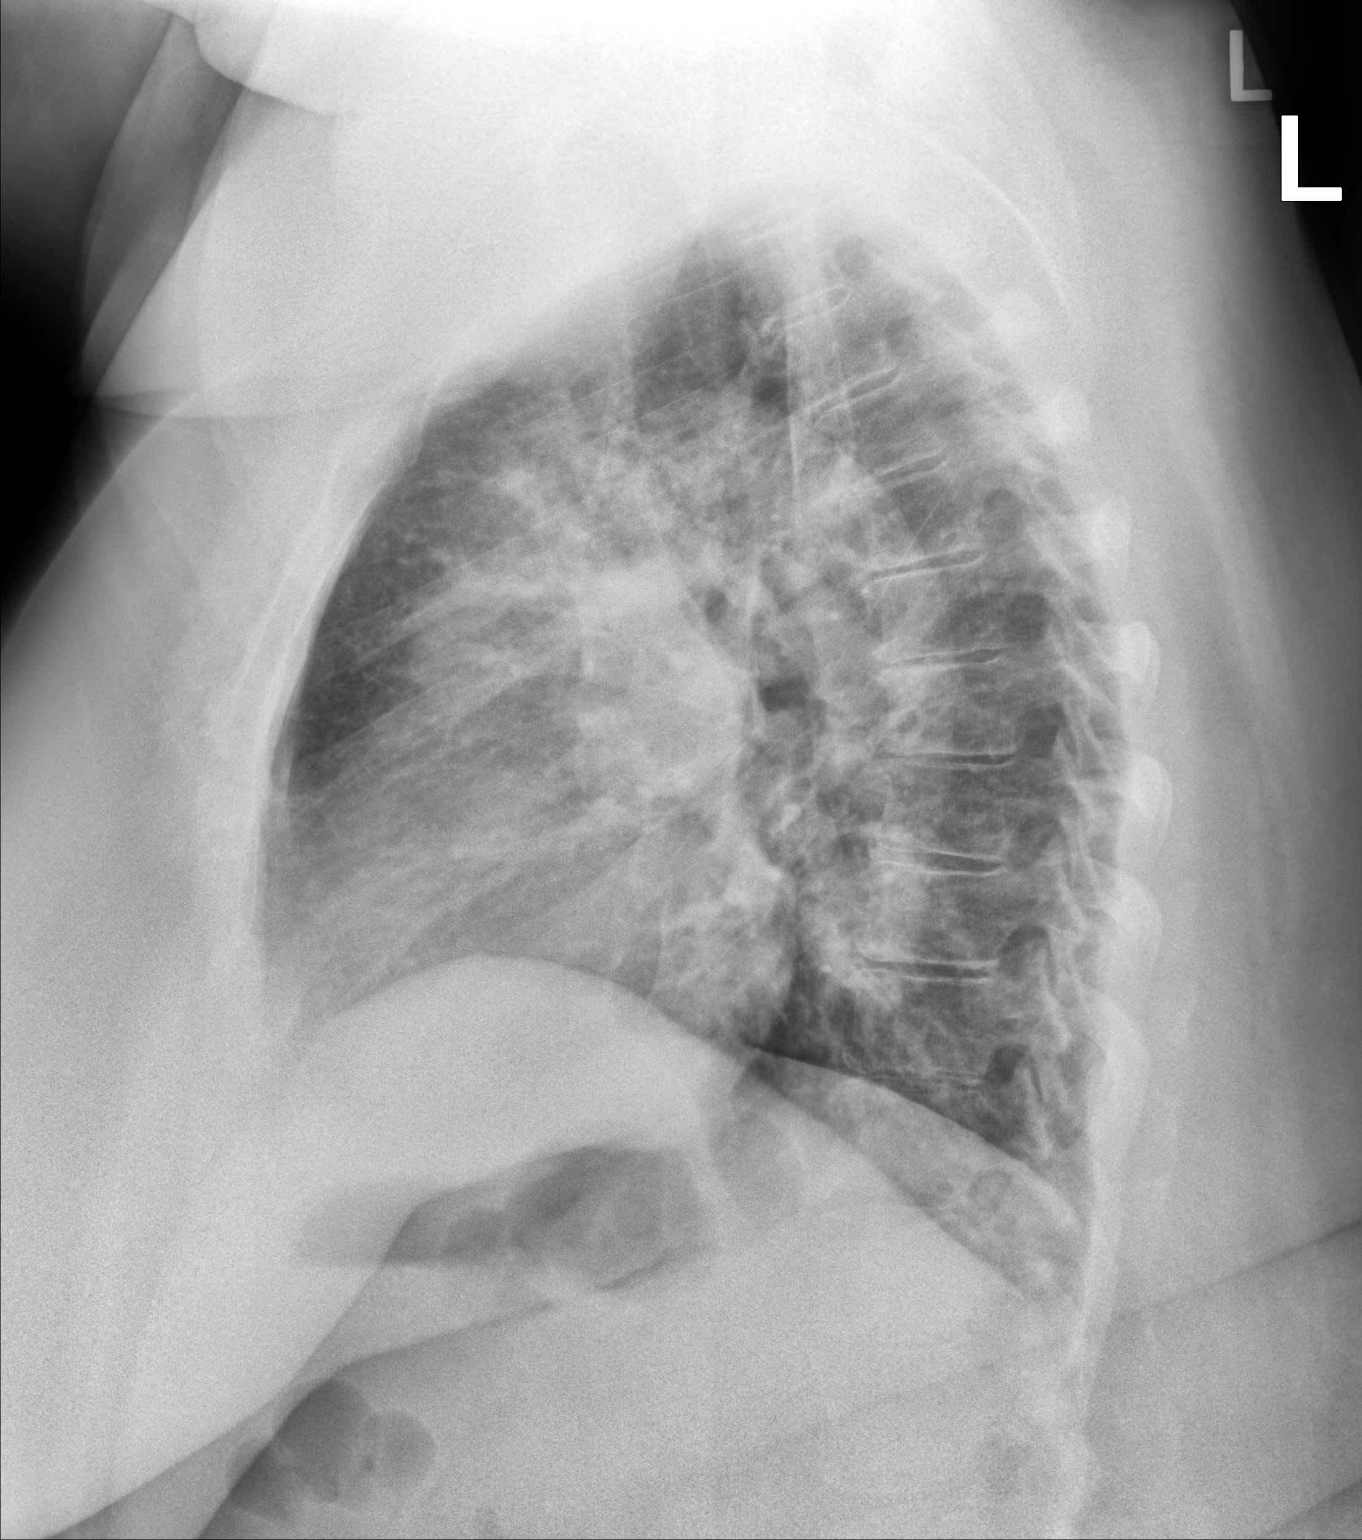

[2 of 2 positions shown; findings below may reference images not displayed]

FINDINGS: Stable enlarged cardiac silhouette. Patchy consolidation in the
right mid lung zone. No pleural effusion or pneumothorax. Bones are
unremarkable.
IMPRESSION: 1. Patchy consolidation in the right mid lung zone compatible with
pneumonia. Followup PA and lateral chest X-ray is recommended in 3-4
weeks following trial of antibiotic therapy to ensure resolution and
exclude underlying malignancy.
2. Stable enlarged cardiac silhouette.

These results will be called to the ordering clinician or
representative by the Radiologist Assistant, and communication
documented in the PACS or zVision Dashboard.

## 2019-03-29 DIAGNOSIS — Z23 Encounter for immunization: Secondary | ICD-10-CM | POA: Diagnosis not present

## 2019-04-12 IMAGING — DX CHEST - 2 VIEW
2 series · 2 of 2 positions shown · non-contrast
Comparison: 08/11/2018

CLINICAL DATA: Follow-up pneumonia

EXAM:
CHEST - 2 VIEW

[chest pa]
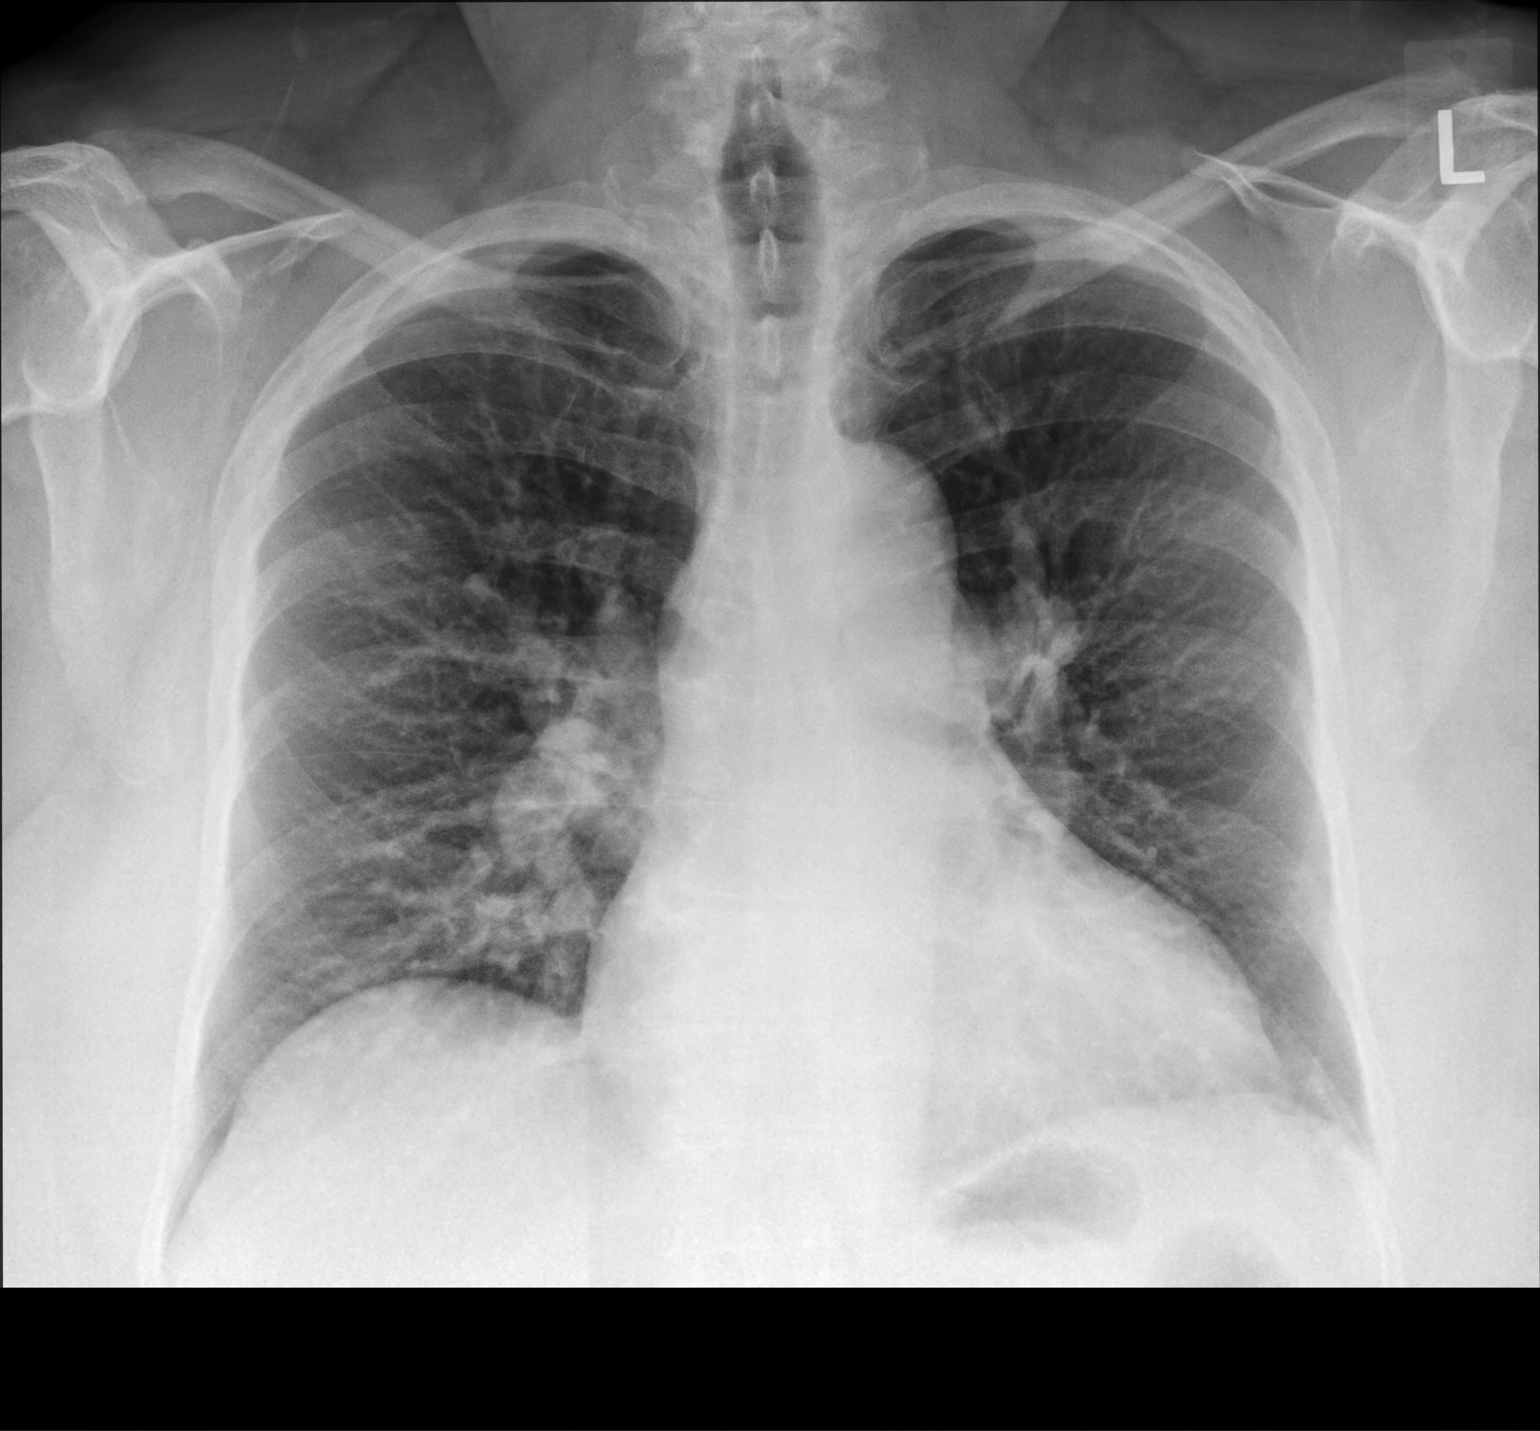

[chest lat]
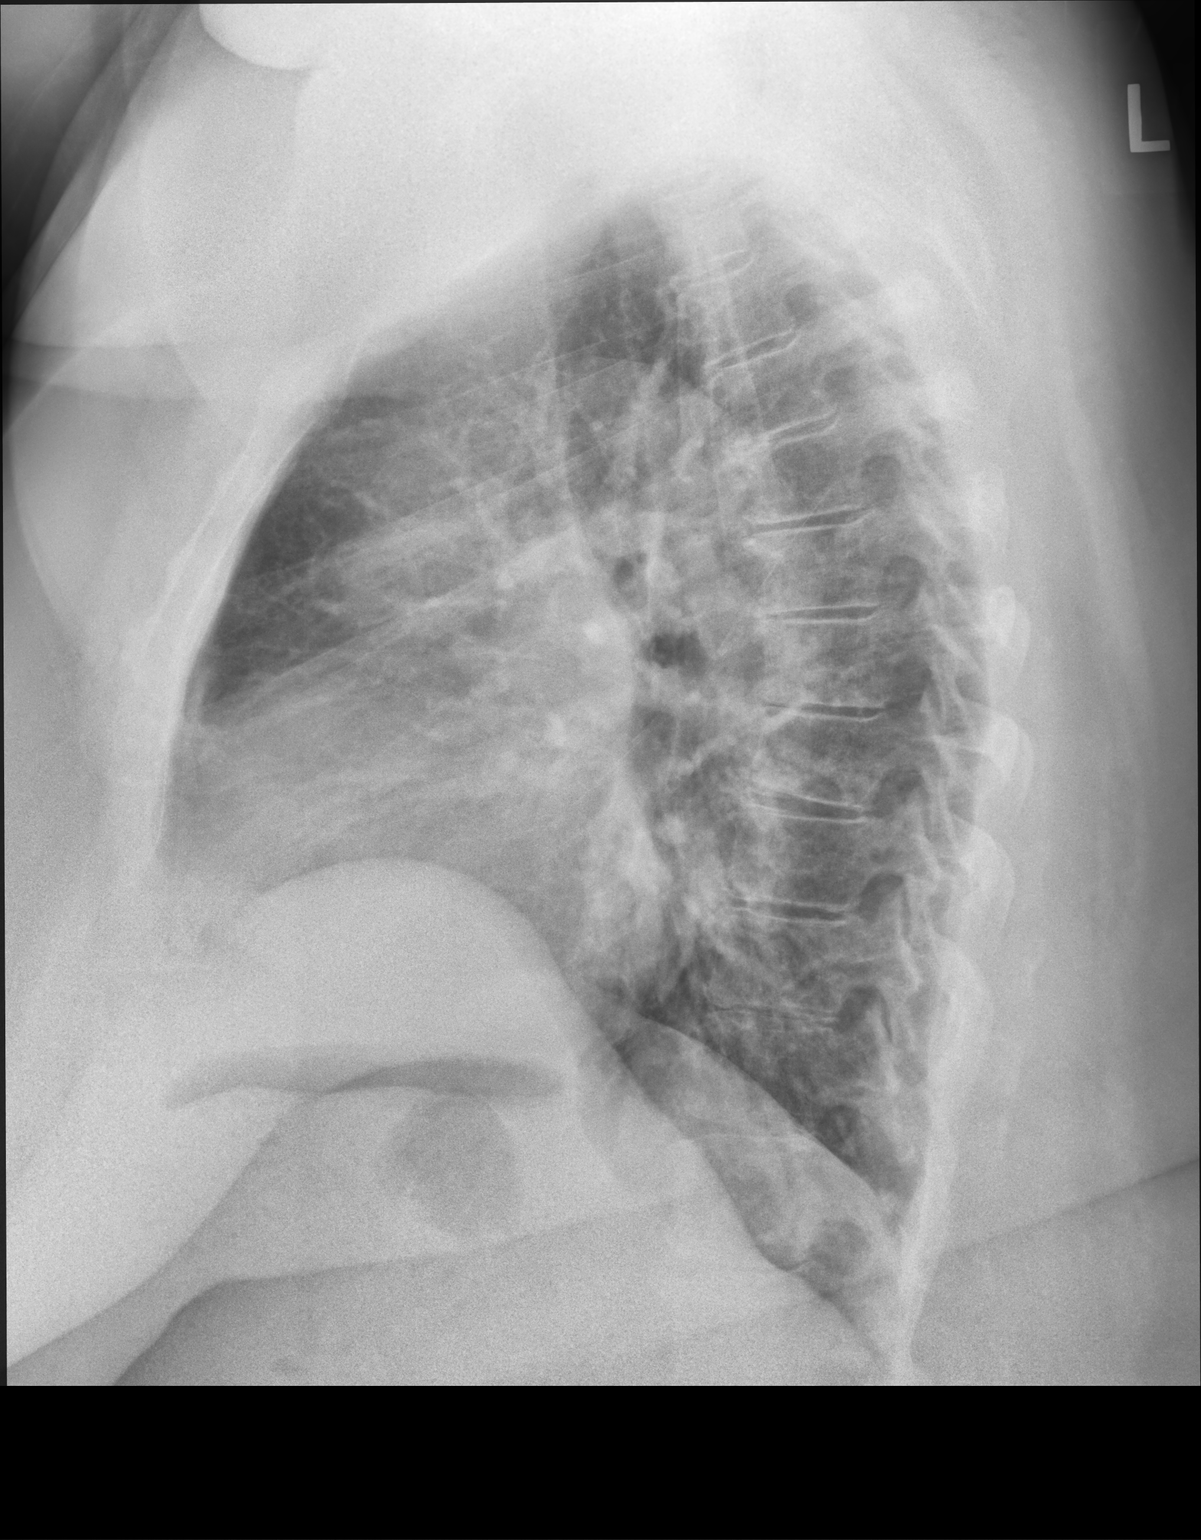

[2 of 2 positions shown; findings below may reference images not displayed]

FINDINGS: Cardiac shadow is mildly enlarged. The lungs are well aerated
bilaterally. Patchy infiltrate seen in the right lung has
predominately resolved. No new focal abnormality is noted. Mild
degenerative changes of thoracic spine are seen.
IMPRESSION: Near complete resolution of previously seen right-sided infiltrate.

## 2019-05-03 ENCOUNTER — Telehealth: Payer: Self-pay | Admitting: Pulmonary Disease

## 2019-05-03 NOTE — Telephone Encounter (Signed)
Berna Bue Order: 30mg  #1 prefilled syringe Ordered date: 05/03/2019 Expected date of arrival: 05/04/2019 Ordered by: Desmond Dike, [place]  [facility]ty [facility]: [facility]

## 2019-05-04 NOTE — Telephone Encounter (Signed)
Berna Bue Shipment Received:  30mg  #1 prefilled syringe Medication arrival date: 05/04/2019 Lot #: [id number] Exp date: 05/2020 Received by: Desmond Dike, [place]

## 2019-05-11 ENCOUNTER — Other Ambulatory Visit: Payer: Self-pay

## 2019-05-11 ENCOUNTER — Ambulatory Visit (INDEPENDENT_AMBULATORY_CARE_PROVIDER_SITE_OTHER): Payer: Medicare Other

## 2019-05-11 DIAGNOSIS — J4551 Severe persistent asthma with (acute) exacerbation: Secondary | ICD-10-CM | POA: Diagnosis not present

## 2019-05-11 MED ORDER — BENRALIZUMAB 30 MG/ML ~~LOC~~ SOSY
30.0000 mg | PREFILLED_SYRINGE | Freq: Once | SUBCUTANEOUS | Status: AC
  Administered 2019-05-11: 11:00:00 30 mg via SUBCUTANEOUS

## 2019-05-11 NOTE — Progress Notes (Signed)
Have you been hospitalized within the last 10 days?  No Do you have a fever?  No Do you have a cough?  No Do you have a headache or sore throat? No Do you have your Epi Pen visible and is it within date?  Yes 

## 2019-05-17 ENCOUNTER — Encounter: Payer: Self-pay | Admitting: Primary Care

## 2019-05-17 ENCOUNTER — Ambulatory Visit (INDEPENDENT_AMBULATORY_CARE_PROVIDER_SITE_OTHER): Payer: Medicare Other | Admitting: Primary Care

## 2019-05-17 ENCOUNTER — Ambulatory Visit: Payer: PRIVATE HEALTH INSURANCE | Admitting: Primary Care

## 2019-05-17 DIAGNOSIS — J4550 Severe persistent asthma, uncomplicated: Secondary | ICD-10-CM

## 2019-05-17 DIAGNOSIS — G4733 Obstructive sleep apnea (adult) (pediatric): Secondary | ICD-10-CM | POA: Diagnosis not present

## 2019-05-17 DIAGNOSIS — Z9989 Dependence on other enabling machines and devices: Secondary | ICD-10-CM | POA: Diagnosis not present

## 2019-05-17 MED ORDER — AZELASTINE HCL 0.1 % NA SOLN: 1.0000 | Freq: Two times a day (BID) | NASAL | 1 refills | Status: DC

## 2019-05-17 NOTE — Addendum Note (Signed)
Addended by: Nena Polio on: 05/17/2019 02:55 PM   Modules accepted: Orders

## 2019-05-17 NOTE — Progress Notes (Signed)
Virtual Visit via Telephone Note  I connected with Tamara Daugherty on 05/17/19 at 11:00 AM EST by telephone and verified that I am speaking with the correct person using two identifiers.  Location: Patient: Home  Provider: Home   I discussed the limitations, risks, security and privacy concerns of performing an evaluation and management service by telephone and the availability of in person appointments. I also discussed with the patient that there may be a patient responsible charge related to this service. The patient expressed understanding and agreed to proceed.   History of Present Illness: 68 year old female, passive smoke exposure. PMH severe persistent asthma with eosinophilia, CAP, OSA on CPAP, GERD, obesity. Patient of Dr. [place] Bells, last seen on 09/01/18. Plan to exhaust all options to help get financial coverage for Dupixent, if no other options will change to [place]. Maintained on Symbicort and montelukast.   Previous [facility] pulmonary encounter: 10/28/2018 Patient presents today for 2 month follow-up visit. Currently receiving Fasenra 30mg  q 4 weeks, received her second injection today. Felt improvement after first injection. States that she was able to work outside in the yard. Started to tell it was wearing off the last week or so. Wheezing a little more. Has prescription prednisone to have on hand. Continues taking singulair, loratadine and flonase nasal spray. Thinks she may have had Covid back in February, she has been free from symptoms for over 6 weeks. Discussed limitations to antibody testing and would like to proceed.   05/17/2019 Patient contacted today for 6 month follow-up OSA and asthma. She is doing well, no acute complaints. Due for new CPAP machine and needed overview. She is compliant with CPAP use and reports benefit from wearing. She has been receiving more frown faces on her APP. No issues with pressure setting or mask fit. States that when she wakes up her humidification  chamber is dry. Continues using Symbicort daily as prescribed, asking if there is a generic or cheaper ICS/LABA inhaler. Reports some sinus drainage and congestion at night. Using sinusal and flonase nasal spray.   [facility] 10/24-11/22/20: 30/30 days; 100% > 4 hours Average time used 7 hours 58 mins Pressure 8 cm h20 Air leaks 20.8L/min (95%) AHI 3.5  Observations/Objective:  - No shortness of breath, wheezing or cough noted during phone conversation  Assessment and Plan:   OSA: - 100% complaint with CPAP and reports benefit from use - Pressure 8cm h20; AHI 3.5 - Due for new machine, renew supplies with DME  Severe persistent asthma: - Symptoms well controlled on ICS/LABA and Fasenra injections - Generic symbicort is too expensive, she will call insurance to see what is on formulary  Follow Up Instructions:   - 6 months with new [facility] pulmonary provider (recommend Dr. Halford Chessman for management of asthma and sleep)  I discussed the assessment and treatment plan with the patient. The patient was provided an opportunity to ask questions and all were answered. The patient agreed with the plan and demonstrated an understanding of the instructions.   The patient was advised to call back or seek an in-person evaluation if the symptoms worsen or if the condition fails to improve as anticipated.  I provided 22 minutes of non-face-to-face time during this encounter.   Martyn Ehrich, NP

## 2019-05-17 NOTE — Patient Instructions (Addendum)
Orders: New machine AND new mask of choice, supplies and humidification   Recommendations: Continue Symbicort and Fasenra injection Call insurance and see what ICS/LABA inhaler is formulary on plan (exaplmes breo, dulera, advair)  FU: 6 months with Dr. Halford Chessman (new patient for OSA and asthma), previous [facility] patient

## 2019-06-15 DIAGNOSIS — R0981 Nasal congestion: Secondary | ICD-10-CM | POA: Diagnosis not present

## 2019-06-15 DIAGNOSIS — J4541 Moderate persistent asthma with (acute) exacerbation: Secondary | ICD-10-CM | POA: Diagnosis not present

## 2019-06-15 DIAGNOSIS — R05 Cough: Secondary | ICD-10-CM | POA: Diagnosis not present

## 2019-06-15 DIAGNOSIS — J0100 Acute maxillary sinusitis, unspecified: Secondary | ICD-10-CM | POA: Diagnosis not present

## 2019-06-28 ENCOUNTER — Telehealth: Payer: Self-pay | Admitting: Primary Care

## 2019-06-28 NOTE — Telephone Encounter (Signed)
Berna Bue Order: 30mg  #1 prefilled syringe Ordered date: 06/28/2019 Expected date of arrival: 06/29/2019 Ordered by: Desmond Dike, [place]  Specialty Pharmacy: [facility]

## 2019-06-29 NOTE — Telephone Encounter (Signed)
Fasenra Shipment Received:  30mg  #1 prefilled syringe Medication arrival date: 06/29/2019 Lot #: [id number] Exp date: 06/2020 Received by: Desmond Dike, [place]

## 2019-07-06 ENCOUNTER — Ambulatory Visit (INDEPENDENT_AMBULATORY_CARE_PROVIDER_SITE_OTHER): Payer: Medicare Other

## 2019-07-06 ENCOUNTER — Other Ambulatory Visit: Payer: Self-pay

## 2019-07-06 DIAGNOSIS — J4550 Severe persistent asthma, uncomplicated: Secondary | ICD-10-CM | POA: Diagnosis not present

## 2019-07-06 MED ORDER — BENRALIZUMAB 30 MG/ML ~~LOC~~ SOSY
30.0000 mg | PREFILLED_SYRINGE | Freq: Once | SUBCUTANEOUS | Status: AC
  Administered 2019-07-06: 12:00:00 30 mg via SUBCUTANEOUS

## 2019-07-06 NOTE — Progress Notes (Signed)
Have you been hospitalized within the last 10 days?  No Do you have a fever?  No Do you have a cough?  No Do you have a headache or sore throat? No Do you have your Epi Pen visible and is it within date?  Yes 

## 2019-07-07 ENCOUNTER — Telehealth: Payer: Self-pay | Admitting: Primary Care

## 2019-07-07 ENCOUNTER — Other Ambulatory Visit: Payer: Self-pay | Admitting: Primary Care

## 2019-07-07 DIAGNOSIS — J4550 Severe persistent asthma, uncomplicated: Secondary | ICD-10-CM

## 2019-07-07 MED ORDER — BUDESONIDE 0.5 MG/2ML IN SUSP: 0.5000 mg | Freq: Two times a day (BID) | RESPIRATORY_TRACT | 12 refills | Status: DC

## 2019-07-07 MED ORDER — FORMOTEROL FUMARATE 20 MCG/2ML IN NEBU: 20.0000 ug | INHALATION_SOLUTION | Freq: Two times a day (BID) | RESPIRATORY_TRACT | 6 refills | Status: DC

## 2019-07-07 NOTE — Telephone Encounter (Signed)
[name]- please advise on pt email thanks  I receive my CPAP supplies from [facility] ([address] [place], [place]).  When I visited [facility] today, they told me I could order the generic form of Symbicort in a liquid form?  I can then use my nebulizer to inhale this liquid form--rather than using my Symbicort inhaler.    The savings would be enormous!  The liquid form would be billed through Medicare and my supplemental health insurance rather than through the pharmacy--probably at no cost to me--resulting in a savings of over $300 per month.   Do you think this is a viable option for me?  If so, would you fax a prescription for the liquid form to [facility] at  [phone]?  Thank you so much!

## 2019-07-07 NOTE — Telephone Encounter (Signed)
Called [facility] and was advised the fax of the printed prescriptions issued for today were received.  Their fax # is [phone]. Nothing further needed at this time.

## 2019-07-07 NOTE — Telephone Encounter (Signed)
Called [facility] and spoke with Jonelle Sidle  She states we need to call the [facility] at [phone]  I called and spoke with Maudie Mercury there in [place]  They were unsure what was needed   Per Sharyne Peach called back from the [facility] in [place]  Call was returned to her  She states that they received the rxs for budesonide and perforomist, however, the chart notes do not mention these meds  [name]- we will have to get you to addend the last ov note to reflect med change  Please advise thanks!

## 2019-07-07 NOTE — Telephone Encounter (Signed)
This is replacing Symbicort d/t cost, see telephone note. I changed to budesonide and perforomist today. Needs prescription sent to [facility]   I'm not sure what previous message is referring to?

## 2019-07-07 NOTE — Telephone Encounter (Signed)
There are no chart notes on the budesonide and performist    [phone] Estill Bamberg

## 2019-07-07 NOTE — Telephone Encounter (Signed)
Yes that is fine. printed

## 2019-07-22 ENCOUNTER — Telehealth: Payer: Self-pay | Admitting: Primary Care

## 2019-07-22 NOTE — Telephone Encounter (Signed)
Diagnosis needed for medications budesonide and perforomist. Diagnosis severe persistent allergic asthma (J45.50). Nothing further at this time.

## 2019-07-22 NOTE — Telephone Encounter (Signed)
[employer].  [facility] needs documentation for budesonide and perforomist showing diagnosis, medication, patient name/dob, dated within at least 1 year.  There is a telephone encounter with medications changed to budesonide and perforomist by Eustaquio Maize, NP. I was told by [facility], if Medicare message Ronney Asters is met, patient name/dob, diagnosis, medication, within last year, it should be accepted. 05/16/20 OV notes and 07/07/19 telephone note faxed to [phone]. Nothing further at this time.

## 2019-07-29 DIAGNOSIS — Z23 Encounter for immunization: Secondary | ICD-10-CM | POA: Diagnosis not present

## 2019-08-03 DIAGNOSIS — Z1231 Encounter for screening mammogram for malignant neoplasm of breast: Secondary | ICD-10-CM | POA: Diagnosis not present

## 2019-08-23 ENCOUNTER — Telehealth: Payer: Self-pay | Admitting: Primary Care

## 2019-08-23 NOTE — Telephone Encounter (Signed)
Berna Bue Order: 30mg  #1 prefilled syringe Ordered date: 08/23/2019 Expected date of arrival: 08/24/2019 Ordered by: Desmond Dike, [place]  Specialty Pharmacy: [facility]

## 2019-08-24 NOTE — Telephone Encounter (Signed)
Fasenra Shipment Received:  30mg  #1 prefilled syringe Medication arrival date: 08/24/2019 Lot #: [id number] Exp date: 09/2020 Received by: Desmond Dike, [place]

## 2019-08-26 DIAGNOSIS — Z23 Encounter for immunization: Secondary | ICD-10-CM | POA: Diagnosis not present

## 2019-08-31 ENCOUNTER — Other Ambulatory Visit: Payer: Self-pay

## 2019-08-31 ENCOUNTER — Ambulatory Visit (INDEPENDENT_AMBULATORY_CARE_PROVIDER_SITE_OTHER): Payer: Medicare Other

## 2019-08-31 DIAGNOSIS — J4550 Severe persistent asthma, uncomplicated: Secondary | ICD-10-CM | POA: Diagnosis not present

## 2019-08-31 MED ORDER — BENRALIZUMAB 30 MG/ML ~~LOC~~ SOSY
30.0000 mg | PREFILLED_SYRINGE | Freq: Once | SUBCUTANEOUS | Status: AC
  Administered 2019-08-31: 30 mg via SUBCUTANEOUS

## 2019-08-31 NOTE — Progress Notes (Signed)
All questions were answered by the patient before medication was administered. Have you been hospitalized in the last 10 days? No Do you have a fever? No Do you have a cough? No Do you have a headache or sore throat? No  

## 2019-09-10 NOTE — Telephone Encounter (Signed)
[name], please advise on pt email.  States that Symbicort makes her feel more sob.  Pt has cut back on using this.  Declined office visit/ video visit.  Thanks!

## 2019-09-10 NOTE — Telephone Encounter (Signed)
She has a hx of severe persistent asthma. Is she still getting fasenra injections? Symbicort was changed to budesonide and perforomist d/t cost- she should be using these nebulizers twice a day. She should not be taking Symbicort.  She can use albuterol rescue inhaler 2 puffs every 6 hours for breakthrough shortness of breath. Needs follow-up visit with Dr. Halford Chessman or new pulmonary provider in April or May (not app). She was a previous BQ patient.

## 2019-09-13 ENCOUNTER — Telehealth: Payer: Self-pay | Admitting: Pulmonary Disease

## 2019-09-13 NOTE — Telephone Encounter (Signed)
Will forward to [facility] as FYI - see pts most recent response.

## 2019-09-13 NOTE — Telephone Encounter (Signed)
Ok that is fine, again needs to be with new Pulmonary MD - not APP, d/t former BQ patient

## 2019-09-14 NOTE — Telephone Encounter (Signed)
Called and spoke with Patient.  Patient requested to change fasenra injection to 09/27/19, because she has 1130 OV with Dr. Halford Chessman.  Injection changed to 1045, 09/27/19.  Nothing further at this time.

## 2019-09-20 ENCOUNTER — Telehealth: Payer: Self-pay | Admitting: Pulmonary Disease

## 2019-09-20 NOTE — Telephone Encounter (Signed)
Berna Bue Order: 30mg  #1 prefilled syringe Ordered date: 09/20/2019 Expected date of arrival: 09/21/2019 Ordered by: Desmond Dike, [place]  Specialty Pharmacy: [facility]

## 2019-09-21 NOTE — Telephone Encounter (Signed)
Fasenra Shipment Received:  30mg  #1 prefilled syringe Medication arrival date: 09/21/2019 Lot #: [id number] Exp date: 09/2020 Received by: Desmond Dike, [place]

## 2019-09-22 ENCOUNTER — Telehealth: Payer: Self-pay | Admitting: Pulmonary Disease

## 2019-09-23 NOTE — Telephone Encounter (Signed)
Spoke with pt. Her appointment for Tamara Daugherty has been rescheduled to 10/27/2019. Nothing further was needed.

## 2019-09-27 ENCOUNTER — Ambulatory Visit: Payer: PRIVATE HEALTH INSURANCE

## 2019-10-04 DIAGNOSIS — H2513 Age-related nuclear cataract, bilateral: Secondary | ICD-10-CM | POA: Diagnosis not present

## 2019-10-04 DIAGNOSIS — H52203 Unspecified astigmatism, bilateral: Secondary | ICD-10-CM | POA: Diagnosis not present

## 2019-10-04 DIAGNOSIS — H524 Presbyopia: Secondary | ICD-10-CM | POA: Diagnosis not present

## 2019-10-04 DIAGNOSIS — H43393 Other vitreous opacities, bilateral: Secondary | ICD-10-CM | POA: Diagnosis not present

## 2019-10-18 ENCOUNTER — Telehealth: Payer: Self-pay | Admitting: Pulmonary Disease

## 2019-10-18 NOTE — Telephone Encounter (Signed)
Error

## 2019-10-26 ENCOUNTER — Ambulatory Visit: Payer: PRIVATE HEALTH INSURANCE

## 2019-10-27 ENCOUNTER — Ambulatory Visit (INDEPENDENT_AMBULATORY_CARE_PROVIDER_SITE_OTHER): Payer: Medicare Other | Admitting: Pulmonary Disease

## 2019-10-27 ENCOUNTER — Other Ambulatory Visit: Payer: Self-pay

## 2019-10-27 ENCOUNTER — Encounter: Payer: Self-pay | Admitting: Pulmonary Disease

## 2019-10-27 ENCOUNTER — Ambulatory Visit (INDEPENDENT_AMBULATORY_CARE_PROVIDER_SITE_OTHER): Payer: Medicare Other

## 2019-10-27 VITALS — BP 146/76 | HR 73 | Temp 97.2°F | Ht 65.0 in | Wt 321.2 lb

## 2019-10-27 DIAGNOSIS — G4733 Obstructive sleep apnea (adult) (pediatric): Secondary | ICD-10-CM | POA: Diagnosis not present

## 2019-10-27 DIAGNOSIS — E669 Obesity, unspecified: Secondary | ICD-10-CM | POA: Diagnosis not present

## 2019-10-27 DIAGNOSIS — J302 Other seasonal allergic rhinitis: Secondary | ICD-10-CM | POA: Diagnosis not present

## 2019-10-27 DIAGNOSIS — J4550 Severe persistent asthma, uncomplicated: Secondary | ICD-10-CM | POA: Diagnosis not present

## 2019-10-27 DIAGNOSIS — J4551 Severe persistent asthma with (acute) exacerbation: Secondary | ICD-10-CM | POA: Diagnosis not present

## 2019-10-27 DIAGNOSIS — Z9989 Dependence on other enabling machines and devices: Secondary | ICD-10-CM | POA: Diagnosis not present

## 2019-10-27 DIAGNOSIS — G4730 Sleep apnea, unspecified: Secondary | ICD-10-CM

## 2019-10-27 MED ORDER — BENRALIZUMAB 30 MG/ML ~~LOC~~ SOSY
30.0000 mg | PREFILLED_SYRINGE | Freq: Once | SUBCUTANEOUS | Status: AC
  Administered 2019-10-27: 30 mg via SUBCUTANEOUS

## 2019-10-27 NOTE — Patient Instructions (Signed)
[facility] - [id number] - [id number]  Follow up in 1 year

## 2019-10-27 NOTE — Progress Notes (Signed)
[facility]  Chief Complaint  Patient presents with  . Follow-up    got Fasenra injection today , increase in SOB with increase in pollen     Constitutional:  BP (!) 146/76 (Cuff Size: Large)   Pulse 73   Temp (!) 97.2 F (36.2 C) (Temporal)   Ht 5\' 5"  (1.651 m)   Wt (!) 321 lb 3.2 oz (145.7 kg)   SpO2 99%   BMI 53.45 kg/m   Past Medical History:  Anemia, Anxiety, OA, GERD, HA, Colon polyps, HLD, HTN, Hypothyroidism, PNA, Vertigo, TIA  Summary:  Tamara Daugherty is a 69 y.o. female with eosinophilic asthma and obstructive sleep apnea.  Subjective:   Previously followed by Dr. Lake Bells and Dr. Ashok Cordia.  She was last on ABx about 1 month ago.  Otherwise hasn't had any trouble since February 2020.  She thinks she had COVID then. She did get Moderna vaccine this year.  Hasn't needed nebulizer on regular basis recently.  Has a little more trouble with pollen and allergies.  Not having cough, wheeze, sputum, fever, skin rash.  No issues with CPAP.  Wants to learn more about [facility] weight loss program.   Physical Exam:   Appearance - well kempt  ENMT - no sinus tenderness, no nasal discharge, no oral exudate, Mallampati 3  Respiratory - no wheeze, or rales  CV - regular rate and rhythm, no murmurs  GI - soft, non tender  Lymph - no adenopathy noted in neck  Ext - no edema  Skin - no rashes  Neuro - normal strength, oriented x 3  Psych - normal mood and affect  Assessment/Plan:   Eosinophilic asthma. - started on fasenra in May 2020 - continue singulair - prn performist, pulmicort, albuterol - nebulizer medication is more cost effective for her compare to inhaler therapy  Allergic rhinitis. - continue singulair - prn azelastine, fluticasone, loratadine  Obstructive sleep apnea. - she is compliant with CPAP and reports benefit - continue CPAP 8 cm H2O  Obesity. - reviewed options to assist with weight loss - provider her  contact information for [facility] medical weight loss program  A total of  34 minutes spent addressing patient care issues on day of visit.  Follow up:  Patient Instructions  [facility] - [phone] - [phone]  Follow up in 1 year  Signature:  Chesley Mires, MD [place] Pager: [phone] 10/27/2019, 12:45 PM  Flow Sheet     Pulmonary tests:  RAST 08/30/16 >> dust mights, IgE 121 PFT 09/27/16 >> FEV1 1.56 (71%), FEV1% 81, TLC 3.46 (64%), DLCO 69% PFT 01/09/17 >> FEV1 1.67 (76%), FEV1% 75, DLCO 65% PFT 04/23/18 >> FEV1 1.61 (76%), FEV1% 77, TLC 4.63 (87%), DLCO 69%  Chest imaging:  PSG 12/16/13 ([facility]) >> moderate OSA HRCT chest 01/16/17 >> mild tracheomalacia, mild CM, PA 4.1 cm  Sleep tests:  CPAP 09/26/19 to 10/25/19 >> used on 30 of 30 nights with average 7 hrs 27 min.  Average AHI 2.3 with CPAP 8 cm H2O  Cardiac tests:  Echo 01/16/17 >> EF 55 to 60%, mild MR  Medications:   Allergies as of 10/27/2019      Reactions   Bee Venom Anaphylaxis   Diphenhydramine Hcl Other (See Comments)   Paradoxical reaction/ becomes hyper for days   Penicillins Hives   No reaction to Ancef.  OK to give.     Percocet [oxycodone-acetaminophen] Other (See Comments)  hallucination   Propoxyphene    Sulfa Antibiotics Itching      Medication List       Accurate as of Oct 27, 2019 12:45 PM. If you have any questions, ask your nurse or doctor.        AeroChamber MV inhaler Use as instructed   albuterol 108 (90 Base) MCG/ACT inhaler Commonly known as: VENTOLIN HFA Inhale 2 puffs into the lungs every 6 (six) hours as needed for wheezing or shortness of breath.   azelastine 0.1 % nasal spray Commonly known as: ASTELIN Place 1 spray into both nostrils 2 (two) times daily. Use in each nostril as directed   budesonide 0.5 MG/2ML nebulizer solution Commonly known as: Pulmicort Take 2 mLs (0.5 mg total) by nebulization 2 (two) times daily at 10 AM  and 5 PM.   CALCIUM-VITAMIN D PO Take 1 tablet by mouth daily.   clopidogrel 75 MG tablet Commonly known as: PLAVIX Take 1 tablet by mouth daily.   EPINEPHrine 0.3 mg/0.3 mL Soaj injection Commonly known as: EPI-PEN Inject 0.3 mg into the muscle once.   FISH OIL PO Take 1 capsule by mouth 2 (two) times daily.   fluticasone 50 MCG/ACT nasal spray Commonly known as: FLONASE Place 1 spray into both nostrils 2 (two) times daily as needed for allergies or rhinitis.   Flutter Devi Use flutter valve 3 times a day   formoterol 20 MCG/2ML nebulizer solution Commonly known as: PERFOROMIST Take 2 mLs (20 mcg total) by nebulization 2 (two) times daily.   guaiFENesin 600 MG 12 hr tablet Commonly known as: MUCINEX Take 600 mg by mouth 2 (two) times daily as needed for to loosen phlegm.   hydrOXYzine 25 MG tablet Commonly known as: ATARAX/VISTARIL Take 25 mg by mouth every 8 (eight) hours as needed for itching.   levothyroxine 50 MCG tablet Commonly known as: SYNTHROID Take 50 mcg by mouth daily before breakfast.   loratadine 10 MG tablet Commonly known as: CLARITIN Take 10 mg by mouth daily as needed for allergies.   Matzim LA 240 MG 24 hr tablet Generic drug: diltiazem Take 240 mg by mouth daily.   montelukast 10 MG tablet Commonly known as: SINGULAIR Take 1 tablet (10 mg total) by mouth at bedtime.   multivitamin with minerals tablet Take 1 tablet by mouth daily.   olmesartan-hydrochlorothiazide 40-25 MG tablet Commonly known as: BENICAR HCT Take 1 tablet by mouth daily.   pantoprazole 40 MG tablet Commonly known as: PROTONIX Take 40 mg by mouth daily.   potassium chloride 10 MEQ CR capsule Commonly known as: MICRO-K Take 30 mEq by mouth daily.   pyridOXINE 50 MG tablet Commonly known as: B-6 Take 50 mg by mouth daily.   simvastatin 20 MG tablet Commonly known as: ZOCOR Take 20 mg by mouth daily.   torsemide 20 MG tablet Commonly known as: DEMADEX Take  20 mg by mouth as needed.   TURMERIC PO Take 1 capsule by mouth daily.       Past Surgical History:  She  has a past surgical history that includes Tonsillectomy; Abdominal hysterectomy; Back surgery; Knee arthroscopy; Breast surgery; Cardiac catheterization; trigger thumb; carpel tunnel (Right); Total knee arthroplasty (Left, 06/21/2014); Colonoscopy; Esophagogastroduodenoscopy; and Total knee arthroplasty (Right, 03/21/2015).  Family History:  Her family history includes Cancer in her cousin and maternal aunt; Heart disease in her paternal aunt; Lung cancer in her father and paternal uncle; Rheum arthritis in her mother; Scleroderma in her mother; Stroke in  her brother.  Social History:  She  reports that she is a non-smoker but has been exposed to tobacco smoke. She has never used smokeless tobacco. She reports current alcohol use. She reports that she does not use drugs.

## 2019-10-27 NOTE — Progress Notes (Signed)
Have you been hospitalized within the last 10 days?  No Do you have a fever?  No Do you have a cough?  No Do you have a headache or sore throat? No  

## 2019-11-17 DIAGNOSIS — E669 Obesity, unspecified: Secondary | ICD-10-CM

## 2019-11-17 DIAGNOSIS — G4730 Sleep apnea, unspecified: Secondary | ICD-10-CM

## 2019-11-17 NOTE — Telephone Encounter (Signed)
Please place referral to medical weight loss program with [facility].

## 2019-11-17 NOTE — Telephone Encounter (Signed)
Dr. Halford Chessman please advise if you are ok with placing referral for patient.  I met with you on 10/27/19 and during that visit, my weight was discussed.  You gave me contact information for the weight loss program at [facility].  Would you write a referral for me to this program?   I really want to try to get off all of the medications that I am taking, and the key to this would be significant weight loss.  I have tried many times to lose weight on my own, but I have not been very successful.  Thank you.

## 2019-12-01 ENCOUNTER — Ambulatory Visit (INDEPENDENT_AMBULATORY_CARE_PROVIDER_SITE_OTHER): Payer: Medicare Other | Admitting: Family Medicine

## 2019-12-01 ENCOUNTER — Other Ambulatory Visit: Payer: Self-pay

## 2019-12-01 ENCOUNTER — Encounter (INDEPENDENT_AMBULATORY_CARE_PROVIDER_SITE_OTHER): Payer: Self-pay | Admitting: Family Medicine

## 2019-12-01 VITALS — BP 126/75 | HR 73 | Temp 97.9°F | Ht 65.0 in | Wt 319.0 lb

## 2019-12-01 DIAGNOSIS — Z1331 Encounter for screening for depression: Secondary | ICD-10-CM

## 2019-12-01 DIAGNOSIS — R5383 Other fatigue: Secondary | ICD-10-CM

## 2019-12-01 DIAGNOSIS — R7303 Prediabetes: Secondary | ICD-10-CM | POA: Diagnosis not present

## 2019-12-01 DIAGNOSIS — R0602 Shortness of breath: Secondary | ICD-10-CM | POA: Diagnosis not present

## 2019-12-01 DIAGNOSIS — Z0289 Encounter for other administrative examinations: Secondary | ICD-10-CM

## 2019-12-01 DIAGNOSIS — E559 Vitamin D deficiency, unspecified: Secondary | ICD-10-CM | POA: Diagnosis not present

## 2019-12-01 DIAGNOSIS — Z6841 Body Mass Index (BMI) 40.0 and over, adult: Secondary | ICD-10-CM

## 2019-12-01 DIAGNOSIS — I10 Essential (primary) hypertension: Secondary | ICD-10-CM

## 2019-12-01 NOTE — Progress Notes (Signed)
Dear Dr. Chesley Mires,   Thank you for referring Tamara Daugherty to our clinic. The following note includes my evaluation and treatment recommendations.  Chief Complaint:   OBESITY Tamara Daugherty (MR# [id number]) is a 69 y.o. female who presents for evaluation and treatment of obesity and related comorbidities. Current BMI is Body mass index is 53.08 kg/m.Tamara Daugherty has been struggling with her weight for many years and has been unsuccessful in either losing weight, maintaining weight loss, or reaching her healthy weight goal.  Tamara Daugherty is currently in the action stage of change and ready to dedicate time achieving and maintaining a healthier weight. Edit is interested in becoming our patient and working on intensive lifestyle modifications including (but not limited to) diet and exercise for weight loss.  Tamara Daugherty is referred by Dr. Halford Chessman. Tamara Daugherty is lactose intolerant, but eats cheese. She skips lunch almost daily. 10:30 a.m./11:00 a.m. she will have 1/2 cup of Cheerios cereal or 1 sausage patty, 3 slices of bacon, 2 eggs with toast and coffee with sugar and feels full; she skips lunch; 5:00 p.m./5:30 p.m. she has 2.5 oz pork chop, 1/2 cup mashed potatoes, and 1 cup of green beans and feels full. After dinner she will have 1 cup of strawberry ice cream.  Tamara Daugherty's habits were reviewed today and are as follows: Her family eats meals together, she thinks her family will eat healthier with her, her desired weight loss is 139 lbs, she started gaining weight after her baby was born in 1979, her heaviest weight ever was 320 pounds, she craves sweets/ice cream, she snacks frequently in the evenings, she skips lunch frequently, she is frequently drinking liquids with calories and she has binge eating behaviors.  Depression Screen Tamara Daugherty's Food and Mood (modified PHQ-9) score was 11.  Depression screen PHQ 2/9 12/01/2019  Decreased Interest 1  Down, Depressed, Hopeless 1  PHQ - 2 Score 2  Altered sleeping 3  Tired,  decreased energy 3  Change in appetite 1  Feeling bad or failure about yourself  0  Moving slowly or fidgety/restless 2  Suicidal thoughts 0  PHQ-9 Score 11  Difficult doing work/chores Somewhat difficult   Subjective:   Other fatigue. Tamara Daugherty denies daytime somnolence and denies waking up still tired. Patent has a history of symptoms of morning headache. Tamara Daugherty generally gets 7-9 hours of sleep per night, and states that she has generally restful sleep. Snoring is not present with the CPAP machine. Apneic episodes are not present. Epworth Sleepiness Score is 9.  SOB (shortness of breath) on exertion. Tamara Daugherty notes increasing shortness of breath with exercising and seems to be worsening over time with weight gain. She notes getting out of breath sooner with activity than she used to. This has not gotten worse recently. Tamara Daugherty denies shortness of breath at rest or orthopnea.  Prediabetes. Tamara Daugherty has a diagnosis of prediabetes based on her elevated HgA1c and was informed this puts her at greater risk of developing diabetes. She continues to work on diet and exercise to decrease her risk of diabetes. She denies nausea or hypoglycemia. A1c recently 5.7 in August 2020. Tamara Daugherty is not on metformin.  No results found for: HGBA1C No results found for: INSULIN  Essential hypertension. Hypertension was diagnosed ~30 years ago. No chest pain, chest pressure, or headache.   BP Readings from Last 3 Encounters:  12/01/19 126/75  10/27/19 (!) 146/76  10/28/18 136/84   Lab Results  Component Value Date   CREATININE 1.06 08/21/2018  CREATININE 1.12 08/11/2018   CREATININE 1.42 (H) 03/22/2015   Vitamin D deficiency. Vitamin D deficiency diagnosis is likely given her obesity. [name] is on OTC Vitamin D supplementation.  Depression screening. Tamara Daugherty has a moderately positive depression screen with a PHQ-9 score of 11.  Assessment/Plan:   Other fatigue. Fatigue may be related to obesity, depression or many other  causes. Labs will be ordered, and in the meanwhile, [name] will focus on self care including making healthy food choices, increasing physical activity and focusing on stress reduction. EKG 12-Lead showed NSR with 1st degree AV block and PAC. Comprehensive metabolic panel, CBC with Differential/Platelet, Lipid Panel With LDL/HDL Ratio, T3, T4, TSH labs ordered today.  SOB (shortness of breath) on exertion. Tamara Daugherty's shortness of breath appears to be obesity related and exercise induced. She has agreed to work on weight loss and gradually increase exercise to treat her exercise induced shortness of breath. Will continue to monitor closely. Comprehensive metabolic panel, CBC with Differential/Platelet, Lipid Panel With LDL/HDL Ratio, T3, T4, TSH labs ordered today.  Prediabetes. Tamara Daugherty will continue to work on weight loss, exercise, and decreasing simple carbohydrates to help decrease the risk of diabetes. Comprehensive metabolic panel, Hemoglobin A1c, Insulin, random labs ordered today.  Essential hypertension. Tamara Daugherty is working on healthy weight loss and exercise to improve blood pressure control. We will watch for signs of hypotension as she continues her lifestyle modifications. Comprehensive metabolic panel, Lipid Panel With LDL/HDL Ratio labs ordered today.  Vitamin D deficiency. Low Vitamin D level contributes to fatigue and are associated with obesity, breast, and colon cancer. VITAMIN D 25 Hydroxy (Vit-D Deficiency, Fractures) level ordered today.  Depression screening. Tamara Daugherty had a positive depression screening. Depression is commonly associated with obesity and often results in emotional eating behaviors. We will monitor this closely and work on CBT to help improve the non-hunger eating patterns. Referral to Psychology may be required if no improvement is seen as she continues in our clinic.  Class 3 severe obesity with serious comorbidity and body mass index (BMI) of 50.0 to 59.9 in adult, unspecified  obesity type ([place]).  Tamara Daugherty is currently in the action stage of change and her goal is to continue with weight loss efforts. I recommend Yesica begin the structured treatment plan as follows:  She has agreed to the Category 2 Plan + 100 calories.  Exercise goals: No exercise has been prescribed at this time.   Behavioral modification strategies: increasing lean protein intake, increasing vegetables, meal planning and cooking strategies and planning for success.  She was informed of the importance of frequent follow-up visits to maximize her success with intensive lifestyle modifications for her multiple health conditions. She was informed we would discuss her lab results at her next visit unless there is a critical issue that needs to be addressed sooner. Meara agreed to keep her next visit at the agreed upon time to discuss these results.  Objective:   Blood pressure 126/75, pulse 73, temperature 97.9 F (36.6 C), temperature source Oral, height 5\' 5"  (1.651 m), weight (!) 319 lb (144.7 kg), SpO2 95 %. Body mass index is 53.08 kg/m.  EKG: Sinus  Rhythm with a rate of 74 BPM. First degree A-V block - frequent PAC's. PRi = 224, # PACs = 2. Borderline rhythm.  Indirect Calorimeter completed today shows a VO2 of 282 and a REE of 1960.  Her calculated basal metabolic rate is 1660 thus her basal metabolic rate is worse than expected.  General: Cooperative, alert,  well developed, in no acute distress. HEENT: Conjunctivae and lids unremarkable. Cardiovascular: II/VI systolic heart murmur heard best in aorta or pulmonic area.  Lungs: Normal work of breathing. Neurologic: No focal deficits.   Lab Results  Component Value Date   CREATININE 1.06 08/21/2018   BUN 15 08/21/2018   NA 133 (L) 08/21/2018   K 3.6 08/21/2018   CL 94 (L) 08/21/2018   CO2 32 08/21/2018   No results found for: ALT, AST, GGT, ALKPHOS, BILITOT No results found for: HGBA1C No results found for: INSULIN No results found  for: TSH No results found for: CHOL, HDL, LDLCALC, LDLDIRECT, TRIG, CHOLHDL Lab Results  Component Value Date   WBC 6.5 10/28/2018   HGB 12.3 10/28/2018   HCT 37.0 10/28/2018   MCV 91.8 10/28/2018   PLT 324.0 10/28/2018   No results found for: IRON, TIBC, FERRITIN up appointment today.  Attestation Statements:   Reviewed by clinician on day of visit: allergies, medications, problem list, medical history, surgical history, family history, social history, and previous encounter notes.  This is the patient's first visit at [facility]. The patient's NEW PATIENT PACKET was reviewed at length. Included in the packet: current and past health history, medications, allergies, ROS, gynecologic history (women only), surgical history, family history, social history, weight history, weight loss surgery history (for those that have had weight loss surgery), nutritional evaluation, mood and food questionnaire, PHQ9, Epworth questionnaire, sleep habits questionnaire, patient life and health improvement goals questionnaire. These will all be scanned into the patient's chart under media.   During the visit, I independently reviewed the patient's EKG, bioimpedance scale results, and indirect calorimeter results. I used this information to tailor a meal plan for the patient that will help her to lose weight and will improve her obesity-related conditions going forward. I performed a medically necessary appropriate examination and/or evaluation. I discussed the assessment and treatment plan with the patient. The patient was provided an opportunity to ask questions and all were answered. The patient agreed with the plan and demonstrated an understanding of the instructions. Labs were ordered at this visit and will be reviewed at the next visit unless more critical results need to be addressed immediately. Clinical information was updated and documented in the EMR.   Time spent on visit including  pre-visit chart review and post-visit care was 60 minutes.   A separate 15 minutes was spent on risk counseling (see above).     I, Michaelene Song, am acting as transcriptionist for [name], MD   I have reviewed the above documentation for accuracy and completeness, and I agree with the above. - Jinny Blossom, MD

## 2019-12-02 LAB — COMPREHENSIVE METABOLIC PANEL
ALT: 15 IU/L (ref 0–32)
AST: 16 IU/L (ref 0–40)
Albumin/Globulin Ratio: 1.2 (ref 1.2–2.2)
Albumin: 4.2 g/dL (ref 3.8–4.8)
Alkaline Phosphatase: 57 IU/L (ref 48–121)
BUN/Creatinine Ratio: 14 (ref 12–28)
BUN: 13 mg/dL (ref 8–27)
Bilirubin Total: 0.3 mg/dL (ref 0.0–1.2)
CO2: 26 mmol/L (ref 20–29)
Calcium: 9.5 mg/dL (ref 8.7–10.3)
Chloride: 95 mmol/L — ABNORMAL LOW (ref 96–106)
Creatinine, Ser: 0.9 mg/dL (ref 0.57–1.00)
GFR calc Af Amer: 77 mL/min/{1.73_m2} (ref >59)
GFR calc non Af Amer: 67 mL/min/{1.73_m2} (ref >59)
Globulin, Total: 3.6 g/dL (ref 1.5–4.5)
Glucose: 91 mg/dL (ref 65–99)
Potassium: 4 mmol/L (ref 3.5–5.2)
Sodium: 135 mmol/L (ref 134–144)
Total Protein: 7.8 g/dL (ref 6.0–8.5)

## 2019-12-02 LAB — CBC WITH DIFFERENTIAL/PLATELET
Basophils Absolute: 0.1 10*3/uL (ref 0.0–0.2)
Basos: 1 %
EOS (ABSOLUTE): 0 10*3/uL (ref 0.0–0.4)
Eos: 0 %
Hematocrit: 37.7 % (ref 34.0–46.6)
Hemoglobin: 12 g/dL (ref 11.1–15.9)
Immature Grans (Abs): 0 10*3/uL (ref 0.0–0.1)
Immature Granulocytes: 0 %
Lymphocytes Absolute: 3.5 10*3/uL — ABNORMAL HIGH (ref 0.7–3.1)
Lymphs: 41 %
MCH: 29.6 pg (ref 26.6–33.0)
MCHC: 31.8 g/dL (ref 31.5–35.7)
MCV: 93 fL (ref 79–97)
Monocytes Absolute: 0.7 10*3/uL (ref 0.1–0.9)
Monocytes: 8 %
Neutrophils Absolute: 4.3 10*3/uL (ref 1.4–7.0)
Neutrophils: 50 %
Platelets: 310 10*3/uL (ref 150–450)
RBC: 4.06 x10E6/uL (ref 3.77–5.28)
RDW: 14.7 % (ref 11.7–15.4)
WBC: 8.6 10*3/uL (ref 3.4–10.8)

## 2019-12-02 LAB — T3: T3, Total: 123 ng/dL (ref 71–180)

## 2019-12-02 LAB — LIPID PANEL WITH LDL/HDL RATIO
Cholesterol, Total: 155 mg/dL (ref 100–199)
HDL: 68 mg/dL (ref >39)
LDL Chol Calc (NIH): 76 mg/dL (ref 0–99)
LDL/HDL Ratio: 1.1 ratio (ref 0.0–3.2)
Triglycerides: 55 mg/dL (ref 0–149)
VLDL Cholesterol Cal: 11 mg/dL (ref 5–40)

## 2019-12-02 LAB — INSULIN, RANDOM: INSULIN: 19.4 u[IU]/mL (ref 2.6–24.9)

## 2019-12-02 LAB — TSH: TSH: 1.61 u[IU]/mL (ref 0.450–4.500)

## 2019-12-02 LAB — VITAMIN D 25 HYDROXY (VIT D DEFICIENCY, FRACTURES): Vit D, 25-Hydroxy: 39.3 ng/mL (ref 30.0–100.0)

## 2019-12-02 LAB — HEMOGLOBIN A1C
Est. average glucose Bld gHb Est-mCnc: 126 mg/dL
Hgb A1c MFr Bld: 6 % — ABNORMAL HIGH (ref 4.8–5.6)

## 2019-12-02 LAB — T4: T4, Total: 7.1 ug/dL (ref 4.5–12.0)

## 2019-12-06 ENCOUNTER — Other Ambulatory Visit: Payer: Self-pay | Admitting: Primary Care

## 2019-12-13 ENCOUNTER — Telehealth: Payer: Self-pay | Admitting: Pulmonary Disease

## 2019-12-13 NOTE — Telephone Encounter (Signed)
Berna Bue Order: 30mg  #1 prefilled syringe Ordered date: 12/13/19 Expected date of arrival: 12/14/19 Ordered by: [place]: Nigel Mormon

## 2019-12-14 NOTE — Telephone Encounter (Signed)
Berna Bue Shipment Received:  30mg  #1 prefilled syringe Medication arrival date: 12/14/2019 Lot #: [id number] Exp date: 02/2021 Received by: Desmond Dike, [place]

## 2019-12-15 ENCOUNTER — Encounter (INDEPENDENT_AMBULATORY_CARE_PROVIDER_SITE_OTHER): Payer: Self-pay | Admitting: Family Medicine

## 2019-12-15 ENCOUNTER — Other Ambulatory Visit: Payer: Self-pay

## 2019-12-15 ENCOUNTER — Ambulatory Visit (INDEPENDENT_AMBULATORY_CARE_PROVIDER_SITE_OTHER): Payer: Medicare Other | Admitting: Family Medicine

## 2019-12-15 VITALS — BP 134/77 | HR 79 | Temp 98.1°F | Ht 65.0 in | Wt 313.0 lb

## 2019-12-15 DIAGNOSIS — R7303 Prediabetes: Secondary | ICD-10-CM | POA: Diagnosis not present

## 2019-12-15 DIAGNOSIS — E559 Vitamin D deficiency, unspecified: Secondary | ICD-10-CM

## 2019-12-15 DIAGNOSIS — Z6841 Body Mass Index (BMI) 40.0 and over, adult: Secondary | ICD-10-CM

## 2019-12-15 MED ORDER — VITAMIN D (ERGOCALCIFEROL) 1.25 MG (50000 UNIT) PO CAPS: 50000.0000 [IU] | ORAL_CAPSULE | ORAL | 0 refills | Status: DC

## 2019-12-16 NOTE — Progress Notes (Signed)
Chief Complaint:   OBESITY Tamara Daugherty is here to discuss her progress with her obesity treatment plan along with follow-up of her obesity related diagnoses. Tamara Daugherty is on the Category 2 Plan + 100 calories and states she is following her eating plan approximately 50% of the time. Tamara Daugherty states she is doing 0 minutes 0 times per week.  Today's visit was #: 2 Starting weight: 319 lbs Starting date: 12/01/2019 Today's weight: 313 lbs Today's date: 12/15/2019 Total lbs lost to date: 6 Total lbs lost since last in-office visit: 6  Interim History: Tamara Daugherty felt it was a significant portion of food on the meal plan, and she couldn't eat all of it most days. She felt she had plenty of food to eat. She did [employer] cheese, peanut butter and crackers (Townhouse crackers), and strawberry ice cream for snacks. She has an [place] wedding in the upcoming few weeks.  Subjective:   1. Pre-diabetes Tamara Daugherty's last A1c was 6.0 and insulin of 19.4. I discussed labs with the patient today.  2. Vitamin D deficiency Tamara Daugherty denies nausea, vomiting, or muscle weakness, but notes fatigue. She is on Vit D and calcium OTC combination. I discussed labs with the patient today.  Assessment/Plan:   1. Pre-diabetes Tamara Daugherty will continue to work on weight loss, exercise, and decreasing simple carbohydrates to help decrease the risk of diabetes. We will repeat labs in 3 months.  2. Vitamin D deficiency Low Vitamin D level contributes to fatigue and are associated with obesity, breast, and colon cancer. Tamara Daugherty agreed to stop OTC and start prescription Vitamin D 50,000 IU every week with no refills. She will follow-up for routine testing of Vitamin D, at least 2-3 times per year to avoid over-replacement.  - Vitamin D, Ergocalciferol, (DRISDOL) 1.25 MG (50000 UNIT) CAPS capsule; Take 1 capsule (50,000 Units total) by mouth every 7 (seven) days.  Dispense: 4 capsule; Refill: 0  3. Class 3 severe obesity with serious comorbidity and body  mass index (BMI) of 50.0 to 59.9 in adult, unspecified obesity type [facility] Tamara Daugherty is currently in the action stage of change. As such, her goal is to continue with weight loss efforts. She has agreed to the Category 2 Plan + 100 calories.   Exercise goals: No exercise has been prescribed at this time.  Behavioral modification strategies: increasing lean protein intake, meal planning and cooking strategies, keeping healthy foods in the home and planning for success.  Tamara Daugherty has agreed to follow-up with our clinic in 2 weeks. She was informed of the importance of frequent follow-up visits to maximize her success with intensive lifestyle modifications for her multiple health conditions.   Objective:   Blood pressure 134/77, pulse 79, temperature 98.1 F (36.7 C), temperature source Oral, height 5\' 5"  (1.651 m), weight (!) 313 lb (142 kg), SpO2 96 %. Body mass index is 52.09 kg/m.  General: Cooperative, alert, well developed, in no acute distress. HEENT: Conjunctivae and lids unremarkable. Cardiovascular: Regular rhythm.  Lungs: Normal work of breathing. Neurologic: No focal deficits.   Lab Results  Component Value Date   CREATININE 0.90 12/01/2019   BUN 13 12/01/2019   NA 135 12/01/2019   K 4.0 12/01/2019   CL 95 (L) 12/01/2019   CO2 26 12/01/2019   Lab Results  Component Value Date   ALT 15 12/01/2019   AST 16 12/01/2019   ALKPHOS 57 12/01/2019   BILITOT 0.3 12/01/2019   Lab Results  Component Value Date   HGBA1C 6.0 (H)  12/01/2019   Lab Results  Component Value Date   INSULIN 19.4 12/01/2019   Lab Results  Component Value Date   TSH 1.610 12/01/2019   Lab Results  Component Value Date   CHOL 155 12/01/2019   HDL 68 12/01/2019   LDLCALC 76 12/01/2019   TRIG 55 12/01/2019   Lab Results  Component Value Date   WBC 8.6 12/01/2019   HGB 12.0 12/01/2019   HCT 37.7 12/01/2019   MCV 93 12/01/2019   PLT 310 12/01/2019   No results found for: IRON, TIBC,  FERRITIN  Obesity Behavioral Intervention Documentation for Insurance:   Approximately 15 minutes were spent on the discussion below.  ASK: We discussed the diagnosis of obesity with Tamara Daugherty today and Tamara Daugherty agreed to give Tamara Daugherty permission to discuss obesity behavioral modification therapy today.  ASSESS: Tamara Daugherty has the diagnosis of obesity and her BMI today is 52.09. Tamara Daugherty is in the action stage of change.   ADVISE: Tamara Daugherty was educated on the multiple health risks of obesity as well as the benefit of weight loss to improve her health. She was advised of the need for long term treatment and the importance of lifestyle modifications to improve her current health and to decrease her risk of future health problems.  AGREE: Multiple dietary modification options and treatment options were discussed and Tamara Daugherty agreed to follow the recommendations documented in the above note.  ARRANGE: Tamara Daugherty was educated on the importance of frequent visits to treat obesity as outlined per CMS and USPSTF guidelines and agreed to schedule her next follow up appointment today.  Attestation Statements:   Reviewed by clinician on day of visit: allergies, medications, problem list, medical history, surgical history, family history, social history, and previous encounter notes.   I, Trixie Dredge, am acting as transcriptionist for Tamara Common, MD.  I have reviewed the above documentation for accuracy and completeness, and I agree with the above. - Jinny Blossom, MD

## 2019-12-22 ENCOUNTER — Other Ambulatory Visit: Payer: Self-pay

## 2019-12-22 ENCOUNTER — Ambulatory Visit (INDEPENDENT_AMBULATORY_CARE_PROVIDER_SITE_OTHER): Payer: Medicare Other

## 2019-12-22 DIAGNOSIS — J4550 Severe persistent asthma, uncomplicated: Secondary | ICD-10-CM | POA: Diagnosis not present

## 2019-12-22 MED ORDER — BENRALIZUMAB 30 MG/ML ~~LOC~~ SOSY
30.0000 mg | PREFILLED_SYRINGE | Freq: Once | SUBCUTANEOUS | Status: AC
  Administered 2019-12-22: 30 mg via SUBCUTANEOUS

## 2019-12-22 NOTE — Progress Notes (Signed)
All questions were answered by the patient before medication was administered. Have you been hospitalized in the last 10 days? No Do you have a fever? No Do you have a cough? No Do you have a headache or sore throat? No  

## 2019-12-30 ENCOUNTER — Ambulatory Visit (INDEPENDENT_AMBULATORY_CARE_PROVIDER_SITE_OTHER): Payer: Medicare Other | Admitting: Family Medicine

## 2019-12-30 ENCOUNTER — Other Ambulatory Visit: Payer: Self-pay

## 2019-12-30 ENCOUNTER — Encounter (INDEPENDENT_AMBULATORY_CARE_PROVIDER_SITE_OTHER): Payer: Self-pay | Admitting: Family Medicine

## 2019-12-30 VITALS — BP 127/73 | HR 75 | Temp 97.8°F | Ht 65.0 in | Wt 312.0 lb

## 2019-12-30 DIAGNOSIS — R7303 Prediabetes: Secondary | ICD-10-CM

## 2019-12-30 DIAGNOSIS — Z6835 Body mass index (BMI) 35.0-35.9, adult: Secondary | ICD-10-CM | POA: Diagnosis not present

## 2019-12-30 DIAGNOSIS — E559 Vitamin D deficiency, unspecified: Secondary | ICD-10-CM | POA: Diagnosis not present

## 2019-12-30 MED ORDER — VITAMIN D (ERGOCALCIFEROL) 1.25 MG (50000 UNIT) PO CAPS: 50000.0000 [IU] | ORAL_CAPSULE | ORAL | 0 refills | Status: DC

## 2020-01-05 NOTE — Progress Notes (Signed)
Chief Complaint:   OBESITY Tamara Daugherty is here to discuss her progress with her obesity treatment plan along with follow-up of her obesity related diagnoses. Tamara Daugherty is on the Category 2 Plan + 100 calories and states she is following her eating plan approximately 30% of the time. Tamara Daugherty states she is walking more.  Today's visit was #: 3 Starting weight: 319 lbs Starting date: 12/01/2019 Today's weight: 312 lbs Today's date: 12/30/2019 Total lbs lost to date: 7 Total lbs lost since last in-office visit: 1  Interim History: Tamara Daugherty went to a wedding and she had a birthday celebration over the last few weeks. Since this past weekend she has been back on the plan. Her grandson's birthday is next weekend, and her grandson wants to go to a seafood buffet.  Subjective:   1. Vitamin D deficiency Tamara Daugherty denies nausea, vomiting, or muscle weakness, but she notes fatigue. She is on prescription Vit D.  2. Pre-diabetes Tamara Daugherty's last A1c was 6.0 and insulin 19.4. She is not on metformin.  Assessment/Plan:   1. Vitamin D deficiency Low Vitamin D level contributes to fatigue and are associated with obesity, breast, and colon cancer. We will refill prescription Vitamin D for 1 month. Tamara Daugherty will follow-up for routine testing of Vitamin D, at least 2-3 times per year to avoid over-replacement.  - Vitamin D, Ergocalciferol, (DRISDOL) 1.25 MG (50000 UNIT) CAPS capsule; Take 1 capsule (50,000 Units total) by mouth every 7 (seven) days.  Dispense: 4 capsule; Refill: 0  2. Pre-diabetes Tamara Daugherty will continue to work on weight loss, exercise, and decreasing simple carbohydrates to help decrease the risk of diabetes. We will repeat labs in 2 months.  3. Class 2 severe obesity with serious comorbidity and body mass index (BMI) of 35.0 to 35.9 in adult, unspecified obesity type [facility]) Tamara Daugherty is currently in the action stage of change. As such, her goal is to continue with weight loss efforts. She has agreed to the Category 2 Plan  + 100 calories.   Exercise goals: No exercise has been prescribed at this time.  Behavioral modification strategies: increasing lean protein intake, meal planning and cooking strategies, keeping healthy foods in the home and planning for success.  Tamara Daugherty has agreed to follow-up with our clinic in 2 weeks. She was informed of the importance of frequent follow-up visits to maximize her success with intensive lifestyle modifications for her multiple health conditions.   Objective:   Blood pressure 127/73, pulse 75, temperature 97.8 F (36.6 C), temperature source Oral, height 5\' 5"  (1.651 m), weight (!) 312 lb (141.5 kg), SpO2 100 %. Body mass index is 51.92 kg/m.  General: Cooperative, alert, well developed, in no acute distress. HEENT: Conjunctivae and lids unremarkable. Cardiovascular: Regular rhythm.  Lungs: Normal work of breathing. Neurologic: No focal deficits.   Lab Results  Component Value Date   CREATININE 0.90 12/01/2019   BUN 13 12/01/2019   NA 135 12/01/2019   K 4.0 12/01/2019   CL 95 (L) 12/01/2019   CO2 26 12/01/2019   Lab Results  Component Value Date   ALT 15 12/01/2019   AST 16 12/01/2019   ALKPHOS 57 12/01/2019   BILITOT 0.3 12/01/2019   Lab Results  Component Value Date   HGBA1C 6.0 (H) 12/01/2019   Lab Results  Component Value Date   INSULIN 19.4 12/01/2019   Lab Results  Component Value Date   TSH 1.610 12/01/2019   Lab Results  Component Value Date   CHOL 155  12/01/2019   HDL 68 12/01/2019   LDLCALC 76 12/01/2019   TRIG 55 12/01/2019   Lab Results  Component Value Date   WBC 8.6 12/01/2019   HGB 12.0 12/01/2019   HCT 37.7 12/01/2019   MCV 93 12/01/2019   PLT 310 12/01/2019   No results found for: IRON, TIBC, FERRITIN  Obesity Behavioral Intervention Documentation for Insurance:   Approximately 15 minutes were spent on the discussion below.  ASK: We discussed the diagnosis of obesity with Tamara Daugherty today and Tamara Daugherty agreed to give [name]  permission to discuss obesity behavioral modification therapy today.  ASSESS: Tamara Daugherty has the diagnosis of obesity and her BMI today is 51.92. Tamara Daugherty is in the action stage of change.   ADVISE: Tamara Daugherty was educated on the multiple health risks of obesity as well as the benefit of weight loss to improve her health. She was advised of the need for long term treatment and the importance of lifestyle modifications to improve her current health and to decrease her risk of future health problems.  AGREE: Multiple dietary modification options and treatment options were discussed and Tamara Daugherty agreed to follow the recommendations documented in the above note.  ARRANGE: Tamara Daugherty was educated on the importance of frequent visits to treat obesity as outlined per CMS and USPSTF guidelines and agreed to schedule her next follow up appointment today.  Attestation Statements:   Reviewed by clinician on day of visit: allergies, medications, problem list, medical history, surgical history, family history, social history, and previous encounter notes.   I, Trixie Dredge, am acting as transcriptionist for [name], MD.  I have reviewed the above documentation for accuracy and completeness, and I agree with the above. - Jinny Blossom, MD

## 2020-01-10 ENCOUNTER — Telehealth: Payer: Self-pay | Admitting: Pulmonary Disease

## 2020-01-10 DIAGNOSIS — E669 Obesity, unspecified: Secondary | ICD-10-CM

## 2020-01-10 NOTE — Telephone Encounter (Signed)
Call 01/11/20 per pt

## 2020-01-11 NOTE — Telephone Encounter (Signed)
Called and spoke with pt who stated she put her serial number in on the [employer] website and stated that her serial number was one that is being recalled.  Pt called [employer] and they were told that they did not have the inventory to replace her machine.  Pt stated that her machine is a little over a year old. She wants to know what is recommended due to her machine being on the recall list. Dr. Halford Chessman, please advise.

## 2020-01-12 NOTE — Telephone Encounter (Signed)
Given the severity of her sleep apnea and presence of other health problems, she should continue to use CPAP therapy.  It is still not clear what [facility] will do to remedy the problem with their CPAP devices.  Options at this time are 1) to continue using her current CPAP machine while awaiting further information from [facility], or 2) arrange for a new CPAP machine manufactured by a different company but she will likely have to pay for this out of pocket.  Also she should not using any type of ozone based cleaning device for her CPAP.

## 2020-01-12 NOTE — Telephone Encounter (Signed)
Spoke with [facility], advised that some DME's have been offering to order a bacterial filter to put on cpap's inspiratory line to help keep recalled cpaps safer.  [facility] agreed to this plan for pt.   Bacterial filter ordered. lmtcb X1 for pt to make aware of recs.

## 2020-01-13 ENCOUNTER — Other Ambulatory Visit: Payer: Self-pay

## 2020-01-13 ENCOUNTER — Encounter (INDEPENDENT_AMBULATORY_CARE_PROVIDER_SITE_OTHER): Payer: Self-pay | Admitting: Family Medicine

## 2020-01-13 ENCOUNTER — Ambulatory Visit (INDEPENDENT_AMBULATORY_CARE_PROVIDER_SITE_OTHER): Payer: Medicare Other | Admitting: Family Medicine

## 2020-01-13 VITALS — BP 141/73 | HR 91 | Temp 98.3°F | Ht 65.0 in | Wt 308.0 lb

## 2020-01-13 DIAGNOSIS — I10 Essential (primary) hypertension: Secondary | ICD-10-CM | POA: Diagnosis not present

## 2020-01-13 DIAGNOSIS — Z6841 Body Mass Index (BMI) 40.0 and over, adult: Secondary | ICD-10-CM | POA: Diagnosis not present

## 2020-01-13 DIAGNOSIS — R7303 Prediabetes: Secondary | ICD-10-CM

## 2020-01-13 NOTE — Telephone Encounter (Signed)
Attempted to call pt but unable to reach. Left message for her to return call. 

## 2020-01-14 NOTE — Telephone Encounter (Signed)
Spoke with the pt and notified of response per Dr Halford Chessman and she is aware that the bacterial filter was ordered. Nothing further needed.

## 2020-01-18 NOTE — Progress Notes (Signed)
Chief Complaint:   OBESITY Tamara Daugherty is here to discuss her progress with her obesity treatment plan along with follow-up of her obesity related diagnoses. Jakera is on the Category 2 Plan + 100 calories and states she is following her eating plan approximately 50% of the time. Neka states she is doing 0 minutes 0 times per week.  Today's visit was #: 4 Starting weight: 319 lbs Starting date: 12/01/2019 Today's weight: 308 lbs Today's date: 01/13/2020 Total lbs lost to date: 11 Total lbs lost since last in-office visit: 4  Interim History: Tamara Daugherty was ill with a sinus infection over the last few weeks, and she felt nauseated and fatigued with a sore throat. She is feeling better now. She is doing better controlling ice cream nightly, and she is only eating that sparingly.  Subjective:   1. Essential hypertension Tamara Daugherty's blood pressure is 141/73, her blood pressure was well controlled previously. She denies chest pain, chest pressure, or headaches.  2. Pre-diabetes Tamara Daugherty's last A1c was 6.0 and insulin 19.4. She is not on metformin.  Assessment/Plan:   1. Essential hypertension Tamara Daugherty will continue her current medications, and will continue working on healthy weight loss and exercise to improve blood pressure control. We will watch for signs of hypotension as she continues her lifestyle modifications.   2. Pre-diabetes Tamara Daugherty will continue to work on weight loss, exercise, and decreasing simple carbohydrates to help decrease the risk of diabetes. We will repeat labs in 2 months.  3. Class 3 severe obesity with serious comorbidity and body mass index (BMI) of 50.0 to 59.9 in adult, unspecified obesity type [facility]) Tamara Daugherty is currently in the action stage of change. As such, her goal is to continue with weight loss efforts. She has agreed to the Category 2 Plan and keeping a food journal and adhering to recommended goals of 300-400 calories and 25+ grams of protein at lunch daily.   Exercise goals: No  exercise has been prescribed at this time.  Behavioral modification strategies: increasing lean protein intake, meal planning and cooking strategies, keeping healthy foods in the home and planning for success.  Tamara Daugherty has agreed to follow-up with our clinic in 2 weeks. She was informed of the importance of frequent follow-up visits to maximize her success with intensive lifestyle modifications for her multiple health conditions.   Objective:   Blood pressure (!) 141/73, pulse 91, temperature 98.3 F (36.8 C), temperature source Oral, height 5\' 5"  (1.651 m), weight (!) 308 lb (139.7 kg), SpO2 97 %. Body mass index is 51.25 kg/m.  General: Cooperative, alert, well developed, in no acute distress. HEENT: Conjunctivae and lids unremarkable. Cardiovascular: Regular rhythm.  Lungs: Normal work of breathing. Neurologic: No focal deficits.   Lab Results  Component Value Date   CREATININE 0.90 12/01/2019   BUN 13 12/01/2019   NA 135 12/01/2019   K 4.0 12/01/2019   CL 95 (L) 12/01/2019   CO2 26 12/01/2019   Lab Results  Component Value Date   ALT 15 12/01/2019   AST 16 12/01/2019   ALKPHOS 57 12/01/2019   BILITOT 0.3 12/01/2019   Lab Results  Component Value Date   HGBA1C 6.0 (H) 12/01/2019   Lab Results  Component Value Date   INSULIN 19.4 12/01/2019   Lab Results  Component Value Date   TSH 1.610 12/01/2019   Lab Results  Component Value Date   CHOL 155 12/01/2019   HDL 68 12/01/2019   LDLCALC 76 12/01/2019   TRIG  55 12/01/2019   Lab Results  Component Value Date   WBC 8.6 12/01/2019   HGB 12.0 12/01/2019   HCT 37.7 12/01/2019   MCV 93 12/01/2019   PLT 310 12/01/2019   No results found for: IRON, TIBC, FERRITIN  Attestation Statements:   Reviewed by clinician on day of visit: allergies, medications, problem list, medical history, surgical history, family history, social history, and previous encounter notes.  Time spent on visit including pre-visit chart  review and post-visit care and charting was 15 minutes.    I, Trixie Dredge, am acting as transcriptionist for Tamara Common, MD. I have reviewed the above documentation for accuracy and completeness, and I agree with the above. - Jinny Blossom, MD

## 2020-01-27 ENCOUNTER — Other Ambulatory Visit (INDEPENDENT_AMBULATORY_CARE_PROVIDER_SITE_OTHER): Payer: Self-pay | Admitting: Family Medicine

## 2020-01-27 DIAGNOSIS — E559 Vitamin D deficiency, unspecified: Secondary | ICD-10-CM

## 2020-02-01 DIAGNOSIS — Z79899 Other long term (current) drug therapy: Secondary | ICD-10-CM | POA: Diagnosis not present

## 2020-02-01 DIAGNOSIS — Z7902 Long term (current) use of antithrombotics/antiplatelets: Secondary | ICD-10-CM | POA: Diagnosis not present

## 2020-02-01 DIAGNOSIS — N183 Chronic kidney disease, stage 3 unspecified: Secondary | ICD-10-CM | POA: Diagnosis not present

## 2020-02-01 DIAGNOSIS — D508 Other iron deficiency anemias: Secondary | ICD-10-CM | POA: Diagnosis not present

## 2020-02-01 DIAGNOSIS — I10 Essential (primary) hypertension: Secondary | ICD-10-CM | POA: Diagnosis not present

## 2020-02-01 DIAGNOSIS — E782 Mixed hyperlipidemia: Secondary | ICD-10-CM | POA: Diagnosis not present

## 2020-02-01 DIAGNOSIS — E039 Hypothyroidism, unspecified: Secondary | ICD-10-CM | POA: Diagnosis not present

## 2020-02-01 DIAGNOSIS — Z1159 Encounter for screening for other viral diseases: Secondary | ICD-10-CM | POA: Diagnosis not present

## 2020-02-01 DIAGNOSIS — R7303 Prediabetes: Secondary | ICD-10-CM | POA: Diagnosis not present

## 2020-02-03 ENCOUNTER — Other Ambulatory Visit: Payer: Self-pay

## 2020-02-03 ENCOUNTER — Ambulatory Visit (INDEPENDENT_AMBULATORY_CARE_PROVIDER_SITE_OTHER): Payer: Medicare Other | Admitting: Family Medicine

## 2020-02-03 ENCOUNTER — Encounter (INDEPENDENT_AMBULATORY_CARE_PROVIDER_SITE_OTHER): Payer: Self-pay | Admitting: Family Medicine

## 2020-02-03 VITALS — BP 122/78 | HR 71 | Temp 98.3°F | Ht 65.0 in | Wt 307.0 lb

## 2020-02-03 DIAGNOSIS — I10 Essential (primary) hypertension: Secondary | ICD-10-CM | POA: Diagnosis not present

## 2020-02-03 DIAGNOSIS — Z6841 Body Mass Index (BMI) 40.0 and over, adult: Secondary | ICD-10-CM

## 2020-02-03 DIAGNOSIS — R7303 Prediabetes: Secondary | ICD-10-CM | POA: Diagnosis not present

## 2020-02-04 DIAGNOSIS — J4550 Severe persistent asthma, uncomplicated: Secondary | ICD-10-CM | POA: Diagnosis not present

## 2020-02-04 DIAGNOSIS — I10 Essential (primary) hypertension: Secondary | ICD-10-CM | POA: Diagnosis not present

## 2020-02-04 DIAGNOSIS — Z0000 Encounter for general adult medical examination without abnormal findings: Secondary | ICD-10-CM | POA: Diagnosis not present

## 2020-02-04 DIAGNOSIS — Z23 Encounter for immunization: Secondary | ICD-10-CM | POA: Diagnosis not present

## 2020-02-04 DIAGNOSIS — E039 Hypothyroidism, unspecified: Secondary | ICD-10-CM | POA: Diagnosis not present

## 2020-02-04 DIAGNOSIS — E782 Mixed hyperlipidemia: Secondary | ICD-10-CM | POA: Diagnosis not present

## 2020-02-04 DIAGNOSIS — N959 Unspecified menopausal and perimenopausal disorder: Secondary | ICD-10-CM | POA: Diagnosis not present

## 2020-02-07 ENCOUNTER — Telehealth: Payer: Self-pay | Admitting: Pulmonary Disease

## 2020-02-07 NOTE — Telephone Encounter (Signed)
Berna Bue Order: 30mg  #1 prefilled syringe Ordered date: 02/07/20 Expected date of arrival: 02/08/20 Ordered by: [place]: Nigel Mormon

## 2020-02-07 NOTE — Progress Notes (Signed)
Chief Complaint:   OBESITY Tamara Daugherty is here to discuss her progress with her obesity treatment plan along with follow-up of her obesity related diagnoses. Tamara Daugherty is on the Category 2 Plan and keeping a food journal and adhering to recommended goals of 300-400 calories and 25+ grams of protein at lunch daily and states she is following her eating plan approximately 50% of the time. Tamara Daugherty states she is doing 0 minutes 0 times per week.  Today's visit was #: 5 Starting weight: 319 lbs Starting date: 12/01/2019 Today's weight: 307 lbs Today's date: 02/03/2020 Total lbs lost to date: 12 Total lbs lost since last in-office visit: 1  Interim History: Tamara Daugherty voices she wasn't on the meal plan like she wanted to. She has quite a bit of personal responsibilities caring for her grandchildren. She was eating what her grandchildren eat. She is often not hungry, and she is skipping lunch most days.  Subjective:   1. Essential hypertension Tamara Daugherty's blood pressure is well controlled. She denies chest pain, chest pressure, or headaches. She is on Benicar-HCT.  2. Pre-diabetes Tamara Daugherty's last A1c was 6.0. She is not on medications, and she is still experiencing carbohydrate cravings.  Assessment/Plan:   1. Essential hypertension Tamara Daugherty is working on healthy weight loss and exercise to improve blood pressure control. We will watch for signs of hypotension as she continues her lifestyle modifications. Tamara Daugherty will continue her current medications, no refill needed.  2. Pre-diabetes Tamara Daugherty will continue to work on weight loss, exercise, and decreasing simple carbohydrates to help decrease the risk of diabetes. We will repeat labs in 1 month.  3. Class 3 severe obesity with serious comorbidity and body mass index (BMI) of 50.0 to 59.9 in adult, unspecified obesity type Tamara Daugherty) Tamara Daugherty is currently in the action stage of change. As such, her goal is to continue with weight loss efforts. She has agreed to the Category 2 Plan.    Exercise goals: No exercise has been prescribed at this time.  Behavioral modification strategies: increasing lean protein intake, increasing vegetables, meal planning and cooking strategies and keeping healthy foods in the home.  Tamara Daugherty has agreed to follow-up with our clinic in 2 weeks. She was informed of the importance of frequent follow-up visits to maximize her success with intensive lifestyle modifications for her multiple health conditions.   Objective:   Blood pressure 122/78, pulse 71, temperature 98.3 F (36.8 C), temperature source Oral, height 5\' 5"  (1.651 m), weight (!) 307 lb (139.3 kg), SpO2 96 %. Body mass index is 51.09 kg/m.  General: Cooperative, alert, well developed, in no acute distress. HEENT: Conjunctivae and lids unremarkable. Cardiovascular: Regular rhythm.  Lungs: Normal work of breathing. Neurologic: No focal deficits.   Lab Results  Component Value Date   CREATININE 0.90 12/01/2019   BUN 13 12/01/2019   NA 135 12/01/2019   K 4.0 12/01/2019   CL 95 (L) 12/01/2019   CO2 26 12/01/2019   Lab Results  Component Value Date   ALT 15 12/01/2019   AST 16 12/01/2019   ALKPHOS 57 12/01/2019   BILITOT 0.3 12/01/2019   Lab Results  Component Value Date   HGBA1C 6.0 (H) 12/01/2019   Lab Results  Component Value Date   INSULIN 19.4 12/01/2019   Lab Results  Component Value Date   TSH 1.610 12/01/2019   Lab Results  Component Value Date   CHOL 155 12/01/2019   HDL 68 12/01/2019   LDLCALC 76 12/01/2019   TRIG 55  12/01/2019   Lab Results  Component Value Date   WBC 8.6 12/01/2019   HGB 12.0 12/01/2019   HCT 37.7 12/01/2019   MCV 93 12/01/2019   PLT 310 12/01/2019   No results found for: IRON, TIBC, FERRITIN  Attestation Statements:   Reviewed by clinician on day of visit: allergies, medications, problem list, medical history, Tamara history, family history, social history, and previous encounter notes.  Time spent on visit including  pre-visit chart review and post-visit care and charting was 15 minutes.    I, Trixie Dredge, am acting as transcriptionist for [name], MD.  I have reviewed the above documentation for accuracy and completeness, and I agree with the above. - Jinny Blossom, MD

## 2020-02-08 NOTE — Telephone Encounter (Signed)
[facility] Shipment Received:  30mg  #1 prefilled syringe Medication arrival date: 02/08/2020 Lot #: [id number] Exp date: 05/23/2021 Received by: Elliot Dally

## 2020-02-17 ENCOUNTER — Ambulatory Visit (INDEPENDENT_AMBULATORY_CARE_PROVIDER_SITE_OTHER): Payer: Medicare Other

## 2020-02-17 ENCOUNTER — Ambulatory Visit (INDEPENDENT_AMBULATORY_CARE_PROVIDER_SITE_OTHER): Payer: Medicare Other | Admitting: Family Medicine

## 2020-02-17 ENCOUNTER — Other Ambulatory Visit: Payer: Self-pay

## 2020-02-17 ENCOUNTER — Encounter (INDEPENDENT_AMBULATORY_CARE_PROVIDER_SITE_OTHER): Payer: Self-pay | Admitting: Family Medicine

## 2020-02-17 VITALS — BP 117/76 | HR 78 | Temp 98.0°F | Ht 65.0 in | Wt 304.0 lb

## 2020-02-17 DIAGNOSIS — E669 Obesity, unspecified: Secondary | ICD-10-CM | POA: Diagnosis not present

## 2020-02-17 DIAGNOSIS — Z6841 Body Mass Index (BMI) 40.0 and over, adult: Secondary | ICD-10-CM | POA: Diagnosis not present

## 2020-02-17 DIAGNOSIS — J4550 Severe persistent asthma, uncomplicated: Secondary | ICD-10-CM

## 2020-02-17 DIAGNOSIS — E559 Vitamin D deficiency, unspecified: Secondary | ICD-10-CM

## 2020-02-17 DIAGNOSIS — G4730 Sleep apnea, unspecified: Secondary | ICD-10-CM | POA: Diagnosis not present

## 2020-02-17 DIAGNOSIS — I10 Essential (primary) hypertension: Secondary | ICD-10-CM | POA: Diagnosis not present

## 2020-02-17 MED ORDER — VITAMIN D (ERGOCALCIFEROL) 1.25 MG (50000 UNIT) PO CAPS: 50000.0000 [IU] | ORAL_CAPSULE | ORAL | 0 refills | Status: DC

## 2020-02-17 MED ORDER — BENRALIZUMAB 30 MG/ML ~~LOC~~ SOSY
30.0000 mg | PREFILLED_SYRINGE | Freq: Once | SUBCUTANEOUS | Status: AC
  Administered 2020-02-17: 30 mg via SUBCUTANEOUS

## 2020-02-17 NOTE — Progress Notes (Signed)
Chief Complaint:   OBESITY Tamara Daugherty is here to discuss her progress with her obesity treatment plan along with follow-up of her obesity related diagnoses. Tamara Daugherty is on the Category 2 Plan and states she is following her eating plan approximately 50% of the time. Tamara Daugherty states she is walking around the house.  Today's visit was #: 6 Starting weight: 319 lbs Starting date: 12/01/2019 Today's weight: 304 lbs Today's date: 02/17/2020 Total lbs lost to date: 15 Total lbs lost since last in-office visit: 3  Interim History: Tamara Daugherty reports the last few weeks have been "so so". She has been trying to stay in as much as possible. She has been eating some indulgent foods and eating off the plan. She hasn't been eating 3 meals, and trying to do something for lunch (not whole meal), and getting in yogurt at breakfast.  Subjective:   1. Vitamin D deficiency Tamara Daugherty denies nausea, vomiting, or muscle weakness, but she notes fatigue. She is on prescription Vit D. Last Vit D level was of 39.3.  2. Essential hypertension Tamara Daugherty's blood pressure is well controlled. She denies denies chest pain, chest pressure, or headache. She is on diltiazem and Benicar-hydrochlorothiazide.  Assessment/Plan:   1. Vitamin D deficiency Low Vitamin D level contributes to fatigue and are associated with obesity, breast, and colon cancer. We will refill prescription Vitamin D for 1 month. Dannell will follow-up for routine testing of Vitamin D, at least 2-3 times per year to avoid over-replacement.  - Vitamin D, Ergocalciferol, (DRISDOL) 1.25 MG (50000 UNIT) CAPS capsule; Take 1 capsule (50,000 Units total) by mouth every 7 (seven) days.  Dispense: 12 capsule; Refill: 0  2. Essential hypertension Tamara Daugherty will continue her current medications with no change in dose. She will continue working on healthy weight loss and exercise to improve blood pressure control. We will watch for signs of hypotension as she continues her lifestyle  modifications.   3. Class 3 severe obesity with serious comorbidity and body mass index (BMI) of 50.0 to 59.9 in adult, unspecified obesity type Tamara Daugherty) Tamara Daugherty is currently in the action stage of change. As such, her goal is to continue with weight loss efforts. She has agreed to the Category 2 Plan.   Exercise goals: All adults should avoid inactivity. Some physical activity is better than none, and adults who participate in any amount of physical activity gain some health benefits.  Behavioral modification strategies: increasing lean protein intake, no skipping meals, meal planning and cooking strategies and planning for success.  Tamara Daugherty has agreed to follow-up with our clinic in 2 to 3 weeks. She was informed of the importance of frequent follow-up visits to maximize her success with intensive lifestyle modifications for her multiple health conditions.   Objective:   Blood pressure 117/76, pulse 78, temperature 98 F (36.7 C), temperature source Oral, height 5\' 5"  (1.651 m), weight (!) 304 lb (137.9 kg), SpO2 98 %. Body mass index is 50.59 kg/m.  General: Cooperative, alert, well developed, in no acute distress. HEENT: Conjunctivae and lids unremarkable. Cardiovascular: Regular rhythm.  Lungs: Normal work of breathing. Neurologic: No focal deficits.   Lab Results  Component Value Date   CREATININE 0.90 12/01/2019   BUN 13 12/01/2019   NA 135 12/01/2019   K 4.0 12/01/2019   CL 95 (L) 12/01/2019   CO2 26 12/01/2019   Lab Results  Component Value Date   ALT 15 12/01/2019   AST 16 12/01/2019   ALKPHOS 57 12/01/2019  BILITOT 0.3 12/01/2019   Lab Results  Component Value Date   HGBA1C 6.0 (H) 12/01/2019   Lab Results  Component Value Date   INSULIN 19.4 12/01/2019   Lab Results  Component Value Date   TSH 1.610 12/01/2019   Lab Results  Component Value Date   CHOL 155 12/01/2019   HDL 68 12/01/2019   LDLCALC 76 12/01/2019   TRIG 55 12/01/2019   Lab Results    Component Value Date   WBC 8.6 12/01/2019   HGB 12.0 12/01/2019   HCT 37.7 12/01/2019   MCV 93 12/01/2019   PLT 310 12/01/2019   No results found for: IRON, TIBC, FERRITIN  Obesity Behavioral Intervention Documentation for Insurance:   Approximately 15 minutes were spent on the discussion below.  ASK: We discussed the diagnosis of obesity with Tamara Daugherty today and Tamara Daugherty agreed to give [name] permission to discuss obesity behavioral modification therapy today.  ASSESS: Tamara Daugherty has the diagnosis of obesity and her BMI today is 50.59. Tamara Daugherty is in the action stage of change.   ADVISE: Tamara Daugherty was educated on the multiple health risks of obesity as well as the benefit of weight loss to improve her health. She was advised of the need for long term treatment and the importance of lifestyle modifications to improve her current health and to decrease her risk of future health problems.  AGREE: Multiple dietary modification options and treatment options were discussed and Tamara Daugherty agreed to follow the recommendations documented in the above note.  ARRANGE: Tamara Daugherty was educated on the importance of frequent visits to treat obesity as outlined per CMS and USPSTF guidelines and agreed to schedule her next follow up appointment today.  Attestation Statements:   Reviewed by clinician on day of visit: allergies, medications, problem list, medical history, surgical history, family history, social history, and previous encounter notes.   I, Trixie Dredge, am acting as transcriptionist for [name], MD.  I have reviewed the above documentation for accuracy and completeness, and I agree with the above. - Jinny Blossom, MD

## 2020-02-17 NOTE — Progress Notes (Signed)
Have you been hospitalized within the last 10 days?  No Do you have a fever?  No Do you have a cough?  No Do you have a headache or sore throat? No Do you have your Epi Pen visible and is it within date?  Yes 

## 2020-03-07 ENCOUNTER — Ambulatory Visit (INDEPENDENT_AMBULATORY_CARE_PROVIDER_SITE_OTHER): Payer: Medicare Other | Admitting: Family Medicine

## 2020-03-07 ENCOUNTER — Other Ambulatory Visit: Payer: Self-pay

## 2020-03-07 ENCOUNTER — Encounter (INDEPENDENT_AMBULATORY_CARE_PROVIDER_SITE_OTHER): Payer: Self-pay | Admitting: Family Medicine

## 2020-03-07 VITALS — BP 120/76 | HR 73 | Temp 98.1°F | Ht 65.0 in | Wt 305.0 lb

## 2020-03-07 DIAGNOSIS — Z6841 Body Mass Index (BMI) 40.0 and over, adult: Secondary | ICD-10-CM

## 2020-03-07 DIAGNOSIS — E038 Other specified hypothyroidism: Secondary | ICD-10-CM

## 2020-03-07 DIAGNOSIS — R7303 Prediabetes: Secondary | ICD-10-CM

## 2020-03-30 ENCOUNTER — Ambulatory Visit (INDEPENDENT_AMBULATORY_CARE_PROVIDER_SITE_OTHER): Payer: Medicare Other | Admitting: Family Medicine

## 2020-03-30 ENCOUNTER — Encounter (INDEPENDENT_AMBULATORY_CARE_PROVIDER_SITE_OTHER): Payer: Self-pay | Admitting: Family Medicine

## 2020-03-30 ENCOUNTER — Other Ambulatory Visit: Payer: Self-pay

## 2020-03-30 VITALS — BP 139/79 | HR 68 | Temp 98.0°F | Ht 65.0 in | Wt 306.0 lb

## 2020-03-30 DIAGNOSIS — M25572 Pain in left ankle and joints of left foot: Secondary | ICD-10-CM | POA: Diagnosis not present

## 2020-03-30 DIAGNOSIS — Z6841 Body Mass Index (BMI) 40.0 and over, adult: Secondary | ICD-10-CM

## 2020-03-30 DIAGNOSIS — E559 Vitamin D deficiency, unspecified: Secondary | ICD-10-CM

## 2020-03-30 NOTE — Progress Notes (Signed)
Chief Complaint:   OBESITY Tamara Daugherty is here to discuss her progress with her obesity treatment plan along with follow-up of her obesity related diagnoses. Tamara Daugherty is on the Category 2 Plan and states she is following her eating plan approximately 50% of the time. Tamara Daugherty states she was walking a lot on vacation.   Today's visit was #: 8 Starting weight: 319 lbs Starting date: 12/01/2019 Today's weight: 306 lbs Today's date: 03/30/2020 Total lbs lost to date: 13 Total lbs lost since last in-office visit: 0  Interim History: Tamara Daugherty feels she stepped out of the shower wrong and hurt her foot. She went to the beach last week and ate and had a very nice time. She was not following the meal plan while away. She has no travels for the next few weeks. She is doing yogurt or boiled eggs with toast with bacon. Lunch is salad or tuna or protein shake. Dinner is at least 5-6 oz of meat, salad, corn, and squash.  Subjective:   1. Vitamin D deficiency Tamara Daugherty denies nausea, vomiting, or muscle weakness, but she notes fatigue. She is on prescription Vit D. Last Vit D level was 39.3.  2. Left ankle pain Tamara Daugherty has very sensitive left ankle and foot pain around the area of navicular bone.  Assessment/Plan:   1. Vitamin D deficiency Low Vitamin D level contributes to fatigue and are associated with obesity, breast, and colon cancer. Tamara Daugherty agreed to continue taking prescription Vitamin D 50,000 IU every week, no refill needed. She will follow-up for routine testing of Vitamin D, at least 2-3 times per year to avoid over-replacement.  2. Left ankle pain Tamara Daugherty was encouraged to follow up with her primary care physician for further management. She may need an x-ray.  3. Class 3 severe obesity with serious comorbidity and body mass index (BMI) of 50.0 to 59.9 in adult, unspecified obesity type Tamara Daugherty) Tamara Daugherty is currently in the action stage of change. As such, her goal is to continue with weight loss efforts. She has agreed  to the Category 3 Plan with 7 oz of meat at dinner.   Exercise goals: All adults should avoid inactivity. Some physical activity is better than none, and adults who participate in any amount of physical activity gain some health benefits.  Behavioral modification strategies: increasing lean protein intake, no skipping meals, meal planning and cooking strategies and keeping healthy foods in the home.  Tamara Daugherty has agreed to follow-up with our clinic in 2 to 3 weeks. She was informed of the importance of frequent follow-up visits to maximize her success with intensive lifestyle modifications for her multiple health conditions.   Objective:   Blood pressure 139/79, pulse 68, temperature 98 F (36.7 C), temperature source Oral, height 5\' 5"  (1.651 m), weight (!) 306 lb (138.8 kg), SpO2 97 %. Body mass index is 50.92 kg/m.  General: Cooperative, alert, well developed, in no acute distress. HEENT: Conjunctivae and lids unremarkable. Cardiovascular: Regular rhythm.  Lungs: Normal work of breathing. Neurologic: No focal deficits.   Lab Results  Component Value Date   CREATININE 0.90 12/01/2019   BUN 13 12/01/2019   NA 135 12/01/2019   K 4.0 12/01/2019   CL 95 (L) 12/01/2019   CO2 26 12/01/2019   Lab Results  Component Value Date   ALT 15 12/01/2019   AST 16 12/01/2019   ALKPHOS 57 12/01/2019   BILITOT 0.3 12/01/2019   Lab Results  Component Value Date   HGBA1C 6.0 (H)  12/01/2019   Lab Results  Component Value Date   INSULIN 19.4 12/01/2019   Lab Results  Component Value Date   TSH 1.610 12/01/2019   Lab Results  Component Value Date   CHOL 155 12/01/2019   HDL 68 12/01/2019   LDLCALC 76 12/01/2019   TRIG 55 12/01/2019   Lab Results  Component Value Date   WBC 8.6 12/01/2019   HGB 12.0 12/01/2019   HCT 37.7 12/01/2019   MCV 93 12/01/2019   PLT 310 12/01/2019   No results found for: IRON, TIBC, FERRITIN  Attestation Statements:   Reviewed by clinician on day of  visit: allergies, medications, problem list, medical history, surgical history, family history, social history, and previous encounter notes.  Time spent on visit including pre-visit chart review and post-visit care and charting was 15 minutes.    I, Trixie Dredge, am acting as transcriptionist for [name], MD.  I have reviewed the above documentation for accuracy and completeness, and I agree with the above. - Jinny Blossom, MD

## 2020-04-13 ENCOUNTER — Ambulatory Visit (INDEPENDENT_AMBULATORY_CARE_PROVIDER_SITE_OTHER): Payer: Medicare Other

## 2020-04-13 ENCOUNTER — Encounter (INDEPENDENT_AMBULATORY_CARE_PROVIDER_SITE_OTHER): Payer: Self-pay | Admitting: Physician Assistant

## 2020-04-13 ENCOUNTER — Other Ambulatory Visit: Payer: Self-pay

## 2020-04-13 ENCOUNTER — Ambulatory Visit (INDEPENDENT_AMBULATORY_CARE_PROVIDER_SITE_OTHER): Payer: Medicare Other | Admitting: Physician Assistant

## 2020-04-13 VITALS — BP 135/71 | HR 86 | Temp 98.2°F | Ht 65.0 in | Wt 302.0 lb

## 2020-04-13 DIAGNOSIS — J4550 Severe persistent asthma, uncomplicated: Secondary | ICD-10-CM

## 2020-04-13 DIAGNOSIS — Z6841 Body Mass Index (BMI) 40.0 and over, adult: Secondary | ICD-10-CM | POA: Diagnosis not present

## 2020-04-13 DIAGNOSIS — E559 Vitamin D deficiency, unspecified: Secondary | ICD-10-CM

## 2020-04-13 DIAGNOSIS — R7303 Prediabetes: Secondary | ICD-10-CM | POA: Diagnosis not present

## 2020-04-13 MED ORDER — BENRALIZUMAB 30 MG/ML ~~LOC~~ SOSY
30.0000 mg | PREFILLED_SYRINGE | Freq: Once | SUBCUTANEOUS | Status: AC
  Administered 2020-04-13: 30 mg via SUBCUTANEOUS

## 2020-04-13 NOTE — Progress Notes (Signed)
Have you been hospitalized within the last 10 days?  No Do you have a fever?  No Do you have a cough?  No Do you have a headache or sore throat? No Do you have your Epi Pen visible and is it within date?  Yes 

## 2020-04-18 NOTE — Progress Notes (Signed)
Chief Complaint:   Tamara Daugherty is here to discuss her progress with her obesity treatment plan along with follow-up of her obesity related diagnoses. Tamara Daugherty is on the Category 2 Plan and states she is following her eating plan approximately 20% of the time. Tamara Daugherty states she is exercising 0 minutes 0 times per week.  Today's visit was #: 9 Starting weight: 319 lbs Starting date: 12/01/2019 Today's weight: 302 lbs Today's date: 04/13/2020 Total lbs lost to date: 17 Total lbs lost since last in-office visit: 4  Interim History: Tamara Daugherty has asthma and had to take prednisone for 4 days, which increased her appetite. She was craving salt and eating crackers and chips. She is often drinking protein shakes for lunch and not weighing her protein at dinner. She states that she is not hungry throughout the day.  Subjective:   Prediabetes. Tamara Daugherty has a diagnosis of prediabetes based on her elevated HgA1c and was informed this puts her at greater risk of developing diabetes. She continues to work on diet and exercise to decrease her risk of diabetes. She denies nausea or hypoglycemia. Tamara Daugherty is on no medication currently. She denies polyphagia. Last A1c 6.0.  Lab Results  Component Value Date   HGBA1C 6.0 (H) 12/01/2019   Lab Results  Component Value Date   INSULIN 19.4 12/01/2019   Vitamin D deficiency. Tamara Daugherty is on Vitamin D weekly, which she is tolerating well.   Ref. Range 12/01/2019 11:50  Vitamin D, 25-Hydroxy Latest Ref Range: 30.0 - 100.0 ng/mL 39.3   Assessment/Plan:   Prediabetes. Tamara Daugherty will continue to work on weight loss, exercise, and decreasing simple carbohydrates to help decrease the risk of diabetes. She is due for labs.  Vitamin D deficiency. Low Vitamin D level contributes to fatigue and are associated with obesity, breast, and colon cancer. She agrees to continue to take Vitamin D as directed and will follow-up for routine testing of Vitamin D, at least 2-3 times per  year to avoid over-replacement.  Class 3 severe obesity with serious comorbidity and body mass index (BMI) of 50.0 to 59.9 in Tamara Daugherty, unspecified obesity type (Tamara Daugherty).  Tamara Daugherty is currently in the action stage of change. As such, her goal is to continue with weight loss efforts. She has agreed to the Category 2 Plan.   Exercise goals: Tamara Daugherty should follow the Tamara Daugherty guidelines. When Tamara Daugherty cannot meet the Tamara Daugherty guidelines, they should be as physically active as their abilities and conditions will allow.   Behavioral modification strategies: decreasing simple carbohydrates and increasing water intake.  Tamara Daugherty has agreed to follow-up with our clinic in 2 weeks. She was informed of the importance of frequent follow-up visits to maximize her success with intensive lifestyle modifications for her multiple health conditions.   Objective:   Blood pressure 135/71, pulse 86, temperature 98.2 F (36.8 C), temperature source Oral, height 5\' 5"  (1.651 m), weight (!) 302 lb (137 kg), SpO2 96 %. Body mass index is 50.26 kg/m.  General: Cooperative, alert, well developed, in no acute distress. HEENT: Conjunctivae and lids unremarkable. Cardiovascular: Regular rhythm.  Lungs: Normal work of breathing. Neurologic: No focal deficits.   Lab Results  Component Value Date   CREATININE 0.90 12/01/2019   BUN 13 12/01/2019   NA 135 12/01/2019   K 4.0 12/01/2019   CL 95 (L) 12/01/2019   CO2 26 12/01/2019   Lab Results  Component Value Date   ALT 15 12/01/2019   AST 16 12/01/2019  ALKPHOS 57 12/01/2019   BILITOT 0.3 12/01/2019   Lab Results  Component Value Date   HGBA1C 6.0 (H) 12/01/2019   Lab Results  Component Value Date   INSULIN 19.4 12/01/2019   Lab Results  Component Value Date   TSH 1.610 12/01/2019   Lab Results  Component Value Date   CHOL 155 12/01/2019   HDL 68 12/01/2019   LDLCALC 76 12/01/2019   TRIG 55 12/01/2019   Lab Results  Component Value Date   WBC 8.6  12/01/2019   HGB 12.0 12/01/2019   HCT 37.7 12/01/2019   MCV 93 12/01/2019   PLT 310 12/01/2019   No results found for: IRON, TIBC, FERRITIN  Obesity Behavioral Intervention:   Approximately 15 minutes were spent on the discussion below.  ASK: We discussed the diagnosis of obesity with Tamara Daugherty today and Tamara Daugherty agreed to give [name] permission to discuss obesity behavioral modification therapy today.  ASSESS: Tamara Daugherty has the diagnosis of obesity and her BMI today is 50.6. Tamara Daugherty is in the action stage of change.   ADVISE: Tamara Daugherty was educated on the multiple health risks of obesity as well as the benefit of weight loss to improve her health. She was advised of the need for long term treatment and the importance of lifestyle modifications to improve her current health and to decrease her risk of future health problems.  AGREE: Multiple dietary modification options and treatment options were discussed and Tamara Daugherty agreed to follow the recommendations documented in the above note.  ARRANGE: Tamara Daugherty was educated on the importance of frequent visits to treat obesity as outlined per CMS and USPSTF guidelines and agreed to schedule her next follow up appointment today.  Attestation Statements:   Reviewed by clinician on day of visit: allergies, medications, problem list, medical history, surgical history, family history, social history, and previous encounter notes.  Tamara Daugherty, am acting as transcriptionist for Abby Potash, PA-C   I have reviewed the above documentation for accuracy and completeness, and I agree with the above. Abby Potash, PA-C

## 2020-04-26 DIAGNOSIS — Z23 Encounter for immunization: Secondary | ICD-10-CM | POA: Diagnosis not present

## 2020-05-01 ENCOUNTER — Other Ambulatory Visit (INDEPENDENT_AMBULATORY_CARE_PROVIDER_SITE_OTHER): Payer: Self-pay | Admitting: Family Medicine

## 2020-05-01 DIAGNOSIS — E559 Vitamin D deficiency, unspecified: Secondary | ICD-10-CM

## 2020-05-03 ENCOUNTER — Encounter (INDEPENDENT_AMBULATORY_CARE_PROVIDER_SITE_OTHER): Payer: Self-pay | Admitting: Family Medicine

## 2020-05-03 ENCOUNTER — Ambulatory Visit (INDEPENDENT_AMBULATORY_CARE_PROVIDER_SITE_OTHER): Payer: Medicare Other | Admitting: Family Medicine

## 2020-05-03 ENCOUNTER — Other Ambulatory Visit: Payer: Self-pay

## 2020-05-03 VITALS — BP 122/74 | HR 77 | Temp 97.9°F | Ht 65.0 in | Wt 303.0 lb

## 2020-05-03 DIAGNOSIS — I10 Essential (primary) hypertension: Secondary | ICD-10-CM

## 2020-05-03 DIAGNOSIS — Z6841 Body Mass Index (BMI) 40.0 and over, adult: Secondary | ICD-10-CM

## 2020-05-03 DIAGNOSIS — E559 Vitamin D deficiency, unspecified: Secondary | ICD-10-CM | POA: Diagnosis not present

## 2020-05-03 DIAGNOSIS — R7303 Prediabetes: Secondary | ICD-10-CM | POA: Diagnosis not present

## 2020-05-03 MED ORDER — VITAMIN D (ERGOCALCIFEROL) 1.25 MG (50000 UNIT) PO CAPS: 50000.0000 [IU] | ORAL_CAPSULE | ORAL | 0 refills | Status: DC

## 2020-05-04 NOTE — Progress Notes (Signed)
Chief Complaint:   OBESITY Tamara Daugherty is here to discuss her progress with her obesity treatment plan along with follow-up of her obesity related diagnoses. Tamara Daugherty is on the Category 2 Plan and states she is following her eating plan approximately 60% of the time. Tamara Daugherty states she is doing 0 minutes 0 times per week.  Today's visit was #: 10 Starting weight: 319 lbs Starting date: 12/01/2019 Today's weight: 303 lbs Today's date: 05/03/2020 Total lbs lost to date: 16 Total lbs lost since last in-office visit: 0  Interim History: Tamara Daugherty voices she is really struggling to eat on the plan. She is not eating 3 meals but she is trying to drink more water. She is not eating enough. She is having a significant amount of sinus drainage. She is doing a bit of yogurt in the morning, meat and vegetables in the evening, and with or without protein shake at night.  Subjective:   1. Essential hypertension Tamara Daugherty's blood pressure is well controlled today. She denies chest pain, chest pressure, or headache. She is on Benicar, Torsemide, and Tiazec.  2. Pre-diabetes Tamara Daugherty's last A1c was 6.0, and she is not on medications currently.  3. Vitamin D deficiency Tamara Daugherty denies nausea, vomiting, or muscle weakness, but she notes fatigue. She is on prescription Vit D.  Assessment/Plan:   1. Essential hypertension Tamara Daugherty will continue her medications, no change in dosage. She will continue working on healthy weight loss and exercise to improve blood pressure control. We will watch for signs of hypotension as she continues her lifestyle modifications.  2. Pre-diabetes Tamara Daugherty will continue to work on weight loss, exercise, and decreasing simple carbohydrates to help decrease the risk of diabetes. We will repeat labs at her next appointment.  3. Vitamin D deficiency Low Vitamin D level contributes to fatigue and are associated with obesity, breast, and colon cancer. We will refill prescription Vitamin D for 90 days with no  refills. Tamara Daugherty will follow-up for routine testing of Vitamin D, at least 2-3 times per year to avoid over-replacement.  - Vitamin D, Ergocalciferol, (DRISDOL) 1.25 MG (50000 UNIT) CAPS capsule; Take 1 capsule (50,000 Units total) by mouth every 7 (seven) days.  Dispense: 12 capsule; Refill: 0  4. Class 3 severe obesity with serious comorbidity and body mass index (BMI) of 50.0 to 59.9 in adult, unspecified obesity type [facility]) Tamara Daugherty is currently in the action stage of change. As such, her goal is to continue with weight loss efforts. She has agreed to the Category 2 Plan.   Exercise goals: No exercise has been prescribed at this time.  Behavioral modification strategies: increasing lean protein intake, meal planning and cooking strategies, keeping healthy foods in the home and holiday eating strategies .  Tamara Daugherty has agreed to follow-up with our clinic in 2 weeks. She was informed of the importance of frequent follow-up visits to maximize her success with intensive lifestyle modifications for her multiple health conditions.   Objective:   Blood pressure 122/74, pulse 77, temperature 97.9 F (36.6 C), temperature source Oral, height 5\' 5"  (1.651 m), weight (!) 303 lb (137.4 kg), SpO2 98 %. Body mass index is 50.42 kg/m.  General: Cooperative, alert, well developed, in no acute distress. HEENT: Conjunctivae and lids unremarkable. Cardiovascular: Regular rhythm.  Lungs: Normal work of breathing. Neurologic: No focal deficits.   Lab Results  Component Value Date   CREATININE 0.90 12/01/2019   BUN 13 12/01/2019   NA 135 12/01/2019   K 4.0 12/01/2019  CL 95 (L) 12/01/2019   CO2 26 12/01/2019   Lab Results  Component Value Date   ALT 15 12/01/2019   AST 16 12/01/2019   ALKPHOS 57 12/01/2019   BILITOT 0.3 12/01/2019   Lab Results  Component Value Date   HGBA1C 6.0 (H) 12/01/2019   Lab Results  Component Value Date   INSULIN 19.4 12/01/2019   Lab Results  Component Value Date    TSH 1.610 12/01/2019   Lab Results  Component Value Date   CHOL 155 12/01/2019   HDL 68 12/01/2019   LDLCALC 76 12/01/2019   TRIG 55 12/01/2019   Lab Results  Component Value Date   WBC 8.6 12/01/2019   HGB 12.0 12/01/2019   HCT 37.7 12/01/2019   MCV 93 12/01/2019   PLT 310 12/01/2019   No results found for: IRON, TIBC, FERRITIN  Obesity Behavioral Intervention:   Approximately 15 minutes were spent on the discussion below.  ASK: We discussed the diagnosis of obesity with Tamara Daugherty today and Tamara Daugherty agreed to give [name] permission to discuss obesity behavioral modification therapy today.  ASSESS: Tamara Daugherty has the diagnosis of obesity and her BMI today is 50.42. Tamara Daugherty is in the action stage of change.   ADVISE: Tamara Daugherty was educated on the multiple health risks of obesity as well as the benefit of weight loss to improve her health. She was advised of the need for long term treatment and the importance of lifestyle modifications to improve her current health and to decrease her risk of future health problems.  AGREE: Multiple dietary modification options and treatment options were discussed and Tamara Daugherty agreed to follow the recommendations documented in the above note.  ARRANGE: Tamara Daugherty was educated on the importance of frequent visits to treat obesity as outlined per CMS and USPSTF guidelines and agreed to schedule her next follow up appointment today.  Attestation Statements:   Reviewed by clinician on day of visit: allergies, medications, problem list, medical history, surgical history, family history, social history, and previous encounter notes.   I, Trixie Dredge, am acting as transcriptionist for [name], MD.  I have reviewed the above documentation for accuracy and completeness, and I agree with the above. - Jinny Blossom, MD

## 2020-05-15 ENCOUNTER — Ambulatory Visit (INDEPENDENT_AMBULATORY_CARE_PROVIDER_SITE_OTHER): Payer: Medicare Other | Admitting: Family Medicine

## 2020-05-15 ENCOUNTER — Other Ambulatory Visit: Payer: Self-pay

## 2020-05-15 ENCOUNTER — Encounter (INDEPENDENT_AMBULATORY_CARE_PROVIDER_SITE_OTHER): Payer: Self-pay | Admitting: Family Medicine

## 2020-05-15 VITALS — BP 130/75 | HR 77 | Temp 97.9°F | Ht 65.0 in | Wt 301.0 lb

## 2020-05-15 DIAGNOSIS — E559 Vitamin D deficiency, unspecified: Secondary | ICD-10-CM | POA: Diagnosis not present

## 2020-05-15 DIAGNOSIS — Z6841 Body Mass Index (BMI) 40.0 and over, adult: Secondary | ICD-10-CM | POA: Diagnosis not present

## 2020-05-15 DIAGNOSIS — R7303 Prediabetes: Secondary | ICD-10-CM

## 2020-05-15 DIAGNOSIS — I10 Essential (primary) hypertension: Secondary | ICD-10-CM

## 2020-05-16 LAB — LIPID PANEL WITH LDL/HDL RATIO
Cholesterol, Total: 154 mg/dL (ref 100–199)
HDL: 66 mg/dL (ref >39)
LDL Chol Calc (NIH): 77 mg/dL (ref 0–99)
LDL/HDL Ratio: 1.2 ratio (ref 0.0–3.2)
Triglycerides: 49 mg/dL (ref 0–149)
VLDL Cholesterol Cal: 11 mg/dL (ref 5–40)

## 2020-05-16 LAB — VITAMIN D 25 HYDROXY (VIT D DEFICIENCY, FRACTURES): Vit D, 25-Hydroxy: 53.1 ng/mL (ref 30.0–100.0)

## 2020-05-16 LAB — HEMOGLOBIN A1C
Est. average glucose Bld gHb Est-mCnc: 120 mg/dL
Hgb A1c MFr Bld: 5.8 % — ABNORMAL HIGH (ref 4.8–5.6)

## 2020-05-16 LAB — COMPREHENSIVE METABOLIC PANEL
ALT: 15 IU/L (ref 0–32)
AST: 16 IU/L (ref 0–40)
Albumin/Globulin Ratio: 0.9 — ABNORMAL LOW (ref 1.2–2.2)
Albumin: 3.9 g/dL (ref 3.8–4.8)
Alkaline Phosphatase: 57 IU/L (ref 44–121)
BUN/Creatinine Ratio: 15 (ref 12–28)
BUN: 13 mg/dL (ref 8–27)
Bilirubin Total: 0.4 mg/dL (ref 0.0–1.2)
CO2: 27 mmol/L (ref 20–29)
Calcium: 9.7 mg/dL (ref 8.7–10.3)
Chloride: 96 mmol/L (ref 96–106)
Creatinine, Ser: 0.88 mg/dL (ref 0.57–1.00)
GFR calc Af Amer: 79 mL/min/{1.73_m2} (ref >59)
GFR calc non Af Amer: 68 mL/min/{1.73_m2} (ref >59)
Globulin, Total: 4.4 g/dL (ref 1.5–4.5)
Glucose: 92 mg/dL (ref 65–99)
Potassium: 4.1 mmol/L (ref 3.5–5.2)
Sodium: 135 mmol/L (ref 134–144)
Total Protein: 8.3 g/dL (ref 6.0–8.5)

## 2020-05-16 LAB — INSULIN, RANDOM: INSULIN: 20.9 u[IU]/mL (ref 2.6–24.9)

## 2020-05-16 NOTE — Progress Notes (Signed)
Chief Complaint:   OBESITY Tamara Daugherty is here to discuss her progress with her obesity treatment plan along with follow-up of her obesity related diagnoses. Tamara Daugherty is on the Category 2 Plan and states she is following her eating plan approximately 50% of the time. Tamara Daugherty states she is doing 0 minutes 0 times per week.  Today's visit was #: 11 Starting weight: 319 lbs Starting date: 12/01/2019 Today's weight: 301 lbs Today's date: 05/15/2020 Total lbs lost to date: 18 Total lbs lost since last in-office visit: 2  Interim History: Tamara Daugherty has had 3 birthday parties since her last appointment. Trying to prep for the holidays. She is cooking for Thanksgiving. She does realize she will taste food as she prepares it. She has quite a few holiday festivities in the upcoming month.  Subjective:   1. Vitamin D deficiency Tamara Daugherty denies nausea, vomiting, or muscle weakness, but she notes fatigue. She is on prescription Vit D.  2. Essential hypertension Tamara Daugherty's blood pressure is well controlled today. She denies chest pain, chest pressure, or headache.  3. Pre-diabetes Tamara Daugherty's last A1c was 6.0 and insulin 19.4. She is not on medications.  Assessment/Plan:   1. Vitamin D deficiency Low Vitamin D level contributes to fatigue and are associated with obesity, breast, and colon cancer. We will check labs today. Tamara Daugherty will continue prescription Vitamin D 50,000 IU every week, no refill needed. She will follow-up for routine testing of Vitamin D, at least 2-3 times per year to avoid over-replacement.  - VITAMIN D 25 Hydroxy (Vit-D Deficiency, Fractures)  2. Essential hypertension Tamara Daugherty will continue her current medications, no change in dose. She will continue working on healthy weight loss and exercise to improve blood pressure control. We will watch for signs of hypotension as she continues her lifestyle modifications. We will check labs today.  - Comprehensive metabolic panel - Lipid Panel With LDL/HDL  Ratio  3. Pre-diabetes Tamara Daugherty will continue to work on weight loss, exercise, and decreasing simple carbohydrates to help decrease the risk of diabetes. We will check labs today.  - Hemoglobin A1c - Insulin, random  4. Class 3 severe obesity with serious comorbidity and body mass index (BMI) of 50.0 to 59.9 in adult, unspecified obesity type Tamara Daugherty) Tamara Daugherty is currently in the action stage of change. As such, her goal is to continue with weight loss efforts. She has agreed to the Category 2 Plan.   Exercise goals: No exercise has been prescribed at this time.  Behavioral modification strategies: increasing lean protein intake, meal planning and cooking strategies, keeping healthy foods in the home and holiday eating strategies .  Tamara Daugherty has agreed to follow-up with our Daugherty in 3 weeks. She was informed of the importance of frequent follow-up visits to maximize her success with intensive lifestyle modifications for her multiple health conditions.   Tamara Daugherty was informed we would discuss her lab results at her next visit unless there is a critical issue that needs to be addressed sooner. Tamara Daugherty agreed to keep her next visit at the agreed upon time to discuss these results.  Objective:   Blood pressure 130/75, pulse 77, temperature 97.9 F (36.6 C), temperature source Oral, height 5\' 5"  (1.651 m), weight (!) 301 lb (136.5 kg), SpO2 97 %. Body mass index is 50.09 kg/m.  General: Cooperative, alert, well developed, in no acute distress. HEENT: Conjunctivae and lids unremarkable. Cardiovascular: Regular rhythm.  Lungs: Normal work of breathing. Neurologic: No focal deficits.   Lab Results  Component Value  Date   CREATININE 0.88 05/15/2020   BUN 13 05/15/2020   NA 135 05/15/2020   K 4.1 05/15/2020   CL 96 05/15/2020   CO2 27 05/15/2020   Lab Results  Component Value Date   ALT 15 05/15/2020   AST 16 05/15/2020   ALKPHOS 57 05/15/2020   BILITOT 0.4 05/15/2020   Lab Results  Component  Value Date   HGBA1C 5.8 (H) 05/15/2020   HGBA1C 6.0 (H) 12/01/2019   Lab Results  Component Value Date   INSULIN 20.9 05/15/2020   INSULIN 19.4 12/01/2019   Lab Results  Component Value Date   TSH 1.610 12/01/2019   Lab Results  Component Value Date   CHOL 154 05/15/2020   HDL 66 05/15/2020   LDLCALC 77 05/15/2020   TRIG 49 05/15/2020   Lab Results  Component Value Date   WBC 8.6 12/01/2019   HGB 12.0 12/01/2019   HCT 37.7 12/01/2019   MCV 93 12/01/2019   PLT 310 12/01/2019   No results found for: IRON, TIBC, FERRITIN  Obesity Behavioral Intervention:   Approximately 15 minutes were spent on the discussion below.  ASK: We discussed the diagnosis of obesity with Tamara Daugherty today and Tamara Daugherty agreed to give [name] permission to discuss obesity behavioral modification therapy today.  ASSESS: Tamara Daugherty has the diagnosis of obesity and her BMI today is 50.09. Tamara Daugherty is in the action stage of change.   ADVISE: Tamara Daugherty was educated on the multiple health risks of obesity as well as the benefit of weight loss to improve her health. She was advised of the need for long term treatment and the importance of lifestyle modifications to improve her current health and to decrease her risk of future health problems.  AGREE: Multiple dietary modification options and treatment options were discussed and Tamara Daugherty agreed to follow the recommendations documented in the above note.  ARRANGE: Tamara Daugherty was educated on the importance of frequent visits to treat obesity as outlined per CMS and USPSTF guidelines and agreed to schedule her next follow up appointment today.  Attestation Statements:   Reviewed by clinician on day of visit: allergies, medications, problem list, medical history, surgical history, family history, social history, and previous encounter notes.   I, Trixie Dredge, am acting as transcriptionist for [name], MD. I have reviewed the above documentation for accuracy and completeness, and I  agree with the above. - Jinny Blossom, MD

## 2020-05-29 ENCOUNTER — Telehealth: Payer: Self-pay | Admitting: Pulmonary Disease

## 2020-05-29 NOTE — Telephone Encounter (Signed)
Berna Bue Order: 30mg  #1 prefilled syringe Ordered date: 05/29/20 Expected date of arrival: 05/30/20 Ordered by: Sharpes: Nigel Mormon

## 2020-05-30 NOTE — Telephone Encounter (Signed)
[facility] Shipment Received:  30mg  #1 prefilled syringe Medication arrival date: 05/30/20 Lot #: [id number] Exp date: 07/24/2021 Received by: Elliot Dally

## 2020-05-31 ENCOUNTER — Encounter (INDEPENDENT_AMBULATORY_CARE_PROVIDER_SITE_OTHER): Payer: Self-pay | Admitting: Family Medicine

## 2020-05-31 ENCOUNTER — Ambulatory Visit (INDEPENDENT_AMBULATORY_CARE_PROVIDER_SITE_OTHER): Payer: Medicare Other | Admitting: Family Medicine

## 2020-05-31 ENCOUNTER — Other Ambulatory Visit: Payer: Self-pay

## 2020-05-31 VITALS — BP 133/77 | HR 75 | Temp 98.1°F | Ht 65.0 in | Wt 304.0 lb

## 2020-05-31 DIAGNOSIS — E7849 Other hyperlipidemia: Secondary | ICD-10-CM

## 2020-05-31 DIAGNOSIS — E559 Vitamin D deficiency, unspecified: Secondary | ICD-10-CM

## 2020-05-31 DIAGNOSIS — Z6841 Body Mass Index (BMI) 40.0 and over, adult: Secondary | ICD-10-CM

## 2020-06-01 NOTE — Progress Notes (Signed)
Chief Complaint:   OBESITY Tamara Daugherty is here to discuss her progress with her obesity treatment plan along with follow-up of her obesity related diagnoses. Tamara Daugherty is on the Category 2 Plan and states she is following her eating plan approximately 40% of the time. Tamara Daugherty states she is doing 0 minutes 0 times per week.  Today's visit was #: 12 Starting weight: 319 lbs Starting date: 12/01/2019 Today's weight: 304 lbs Today's date: 05/31/2020 Total lbs lost to date: 15 Total lbs lost since last in-office visit: 0  Interim History: Tamara Daugherty voices she has done some indulgent eating more frequently than she would like. She is not eating throughout the day. She is eating breakfast and dinner only. She has 3 more engagements in the next few weeks. She has food in the house. She wants to eat more regularly.  Subjective:   1. Other hyperlipidemia Tamara Daugherty's last LDL was 77, and she denies transaminitis. She is on Lipitor.  2. Vitamin D deficiency Tamara Daugherty's last Vit D level was 53.1. She denies nausea, vomiting, or muscle weakness, but she notes fatigue.   Assessment/Plan:   1. Other hyperlipidemia Cardiovascular risk and specific lipid/LDL goals reviewed. We discussed several lifestyle modifications today. Tamara Daugherty will continue Lipitor, no change in dose, and she will continue to work on diet, exercise and weight loss efforts. Orders and follow up as documented in patient record.   Counseling Intensive lifestyle modifications are the first line treatment for this issue. . Dietary changes: Increase soluble fiber. Decrease simple carbohydrates. . Exercise changes: Moderate to vigorous-intensity aerobic activity 150 minutes per week if tolerated. . Lipid-lowering medications: see documented in medical record.  2. Vitamin D deficiency Low Vitamin D level contributes to fatigue and are associated with obesity, breast, and colon cancer. Tamara Daugherty agreed to continue taking prescription Vitamin D 50,000 IU every week and  will follow-up for routine testing of Vitamin D, at least 2-3 times per year to avoid over-replacement.  3. Class 3 severe obesity with serious comorbidity and body mass index (BMI) of 50.0 to 59.9 in adult, unspecified obesity type Tamara Daugherty) Tamara Daugherty is currently in the action stage of change. As such, her goal is to continue with weight loss efforts. She has agreed to the Category 2 Plan.   Tamara Daugherty is to start using microwave dinner at lunch.  Exercise goals: All adults should avoid inactivity. Some physical activity is better than none, and adults who participate in any amount of physical activity gain some health benefits.  Behavioral modification strategies: increasing lean protein intake, meal planning and cooking strategies, keeping healthy foods in the home and planning for success.  Tamara Daugherty has agreed to follow-up with our clinic in 4 weeks. She was informed of the importance of frequent follow-up visits to maximize her success with intensive lifestyle modifications for her multiple health conditions.   Objective:   Blood pressure 133/77, pulse 75, temperature 98.1 F (36.7 C), temperature source Oral, height 5\' 5"  (1.651 m), weight (!) 304 lb (137.9 kg), SpO2 97 %. Body mass index is 50.59 kg/m.  General: Cooperative, alert, well developed, in no acute distress. HEENT: Conjunctivae and lids unremarkable. Cardiovascular: Regular rhythm.  Lungs: Normal work of breathing. Neurologic: No focal deficits.   Lab Results  Component Value Date   CREATININE 0.88 05/15/2020   BUN 13 05/15/2020   NA 135 05/15/2020   K 4.1 05/15/2020   CL 96 05/15/2020   CO2 27 05/15/2020   Lab Results  Component Value Date  ALT 15 05/15/2020   AST 16 05/15/2020   ALKPHOS 57 05/15/2020   BILITOT 0.4 05/15/2020   Lab Results  Component Value Date   HGBA1C 5.8 (H) 05/15/2020   HGBA1C 6.0 (H) 12/01/2019   Lab Results  Component Value Date   INSULIN 20.9 05/15/2020   INSULIN 19.4 12/01/2019   Lab  Results  Component Value Date   TSH 1.610 12/01/2019   Lab Results  Component Value Date   CHOL 154 05/15/2020   HDL 66 05/15/2020   LDLCALC 77 05/15/2020   TRIG 49 05/15/2020   Lab Results  Component Value Date   WBC 8.6 12/01/2019   HGB 12.0 12/01/2019   HCT 37.7 12/01/2019   MCV 93 12/01/2019   PLT 310 12/01/2019   No results found for: IRON, TIBC, FERRITIN  Attestation Statements:   Reviewed by clinician on day of visit: allergies, medications, problem list, medical history, surgical history, family history, social history, and previous encounter notes.  Time spent on visit including pre-visit chart review and post-visit care and charting was 15 minutes.    I, Trixie Dredge, am acting as transcriptionist for [name], MD.  I have reviewed the above documentation for accuracy and completeness, and I agree with the above. - Jinny Blossom, MD

## 2020-06-08 ENCOUNTER — Other Ambulatory Visit: Payer: Self-pay

## 2020-06-08 ENCOUNTER — Ambulatory Visit (INDEPENDENT_AMBULATORY_CARE_PROVIDER_SITE_OTHER): Payer: Medicare Other

## 2020-06-08 DIAGNOSIS — J4550 Severe persistent asthma, uncomplicated: Secondary | ICD-10-CM | POA: Diagnosis not present

## 2020-06-08 MED ORDER — BENRALIZUMAB 30 MG/ML ~~LOC~~ SOSY
30.0000 mg | PREFILLED_SYRINGE | Freq: Once | SUBCUTANEOUS | Status: AC
  Administered 2020-06-08: 11:00:00 30 mg via SUBCUTANEOUS

## 2020-06-08 NOTE — Progress Notes (Signed)
Have you been hospitalized within the last 10 days?  No Do you have a fever?  No Do you have a cough?  No Do you have a headache or sore throat? No Do you have your Epi Pen visible and is it within date?  Yes 

## 2020-06-27 ENCOUNTER — Ambulatory Visit (INDEPENDENT_AMBULATORY_CARE_PROVIDER_SITE_OTHER): Payer: Medicare Other | Admitting: Family Medicine

## 2020-06-29 ENCOUNTER — Telehealth (INDEPENDENT_AMBULATORY_CARE_PROVIDER_SITE_OTHER): Payer: Self-pay

## 2020-06-29 ENCOUNTER — Encounter (INDEPENDENT_AMBULATORY_CARE_PROVIDER_SITE_OTHER): Payer: Self-pay | Admitting: Family Medicine

## 2020-06-29 ENCOUNTER — Other Ambulatory Visit: Payer: Self-pay

## 2020-06-29 ENCOUNTER — Telehealth (INDEPENDENT_AMBULATORY_CARE_PROVIDER_SITE_OTHER): Payer: Medicare Other | Admitting: Family Medicine

## 2020-06-29 DIAGNOSIS — Z6841 Body Mass Index (BMI) 40.0 and over, adult: Secondary | ICD-10-CM

## 2020-06-29 DIAGNOSIS — R7303 Prediabetes: Secondary | ICD-10-CM | POA: Diagnosis not present

## 2020-06-29 DIAGNOSIS — E7849 Other hyperlipidemia: Secondary | ICD-10-CM

## 2020-06-29 NOTE — Telephone Encounter (Signed)
I connected with  [name] on 06/29/20 by a video enabled telemedicine application and verified that I am speaking with the correct person using two identifiers.   I discussed the limitations of evaluation and management by telemedicine. The patient expressed understanding and agreed to proceed.

## 2020-07-06 DIAGNOSIS — Z20822 Contact with and (suspected) exposure to covid-19: Secondary | ICD-10-CM | POA: Diagnosis not present

## 2020-07-12 NOTE — Progress Notes (Signed)
TeleHealth Visit:  Due to the COVID-19 pandemic, this visit was completed with telemedicine (audio/video) technology to reduce patient and provider exposure as well as to preserve personal protective equipment.   Tamara Daugherty has verbally consented to this TeleHealth visit. The patient is located at home, the provider is located at the [facility] office. The participants in this visit include the listed provider and patient . The visit was conducted today via Mychart Video.  Chief Complaint: OBESITY Tamara Daugherty is here to discuss her progress with her obesity treatment plan along with follow-up of her obesity related diagnoses. Tamara Daugherty is on the Category 2 Plan and states she is following her eating plan approximately 20% of the time. Tamara Daugherty states she is not exercising.  Today's visit was #: 13 Starting weight: 319 lbs Starting date: 304 lbs  Interim History: Tamara Daugherty is doing lots of vegetables and fish but minimal beef and pork. She did not follow plan per say over the holidays. Tamara Daugherty has been eating more sweets. No excursions or events for the next few weeks. Biggest obstacle in next few weeks is sweets.  Subjective:   Prediabetes Last A1c was 5.8. Chryl is not on medication.  Other hyperlipidemia LFT's previously within normal limits. She is on statin and denies myalgias.  Assessment/Plan:   Prediabetes Tamara Daugherty may want to consider metformin.  Other hyperlipidemia Tamara Daugherty will continue statin. No change in treatment.  Class 3 severe obesity with serious comorbidity and body mass index (BMI) of 50.0 to 59.9 in adult, unspecified obesity type [facility])  Tamara Daugherty is currently in the action stage of change. As such, her goal is to continue with weight loss efforts. She has agreed to the Category 2 Plan.   Exercise goals: No exercise has been prescribed at this time.  Behavioral modification strategies: increasing lean protein intake, meal planning and cooking strategies, keeping healthy  foods in the home and planning for success.  Tamara Daugherty has agreed to follow-up with our clinic in 2 weeks. She was informed of the importance of frequent follow-up visits to maximize her success with intensive lifestyle modifications for her multiple health conditions.  Objective:   VITALS: Per patient if applicable, see vitals. GENERAL: Alert and in no acute distress. CARDIOPULMONARY: No increased WOB. Speaking in clear sentences.  PSYCH: Pleasant and cooperative. Speech normal rate and rhythm. Affect is appropriate. Insight and judgement are appropriate. Attention is focused, linear, and appropriate.  NEURO: Oriented as arrived to appointment on time with no prompting.   Lab Results  Component Value Date   CREATININE 0.88 05/15/2020   BUN 13 05/15/2020   NA 135 05/15/2020   K 4.1 05/15/2020   CL 96 05/15/2020   CO2 27 05/15/2020   Lab Results  Component Value Date   ALT 15 05/15/2020   AST 16 05/15/2020   ALKPHOS 57 05/15/2020   BILITOT 0.4 05/15/2020   Lab Results  Component Value Date   HGBA1C 5.8 (H) 05/15/2020   HGBA1C 6.0 (H) 12/01/2019   Lab Results  Component Value Date   INSULIN 20.9 05/15/2020   INSULIN 19.4 12/01/2019   Lab Results  Component Value Date   TSH 1.610 12/01/2019   Lab Results  Component Value Date   CHOL 154 05/15/2020   HDL 66 05/15/2020   LDLCALC 77 05/15/2020   TRIG 49 05/15/2020   Lab Results  Component Value Date   WBC 8.6 12/01/2019   HGB 12.0 12/01/2019   HCT 37.7 12/01/2019  MCV 93 12/01/2019   PLT 310 12/01/2019   No results found for: IRON, TIBC, FERRITIN  Attestation Statements:   Reviewed by clinician on day of visit: allergies, medications, problem list, medical history, surgical history, family history, social history, and previous encounter notes.   I, Para March, am acting as transcriptionist for [name], MD.  I have reviewed the above documentation for accuracy and completeness, and I agree with the  above. - Jinny Blossom, MD

## 2020-07-13 ENCOUNTER — Other Ambulatory Visit: Payer: Self-pay

## 2020-07-13 ENCOUNTER — Encounter (INDEPENDENT_AMBULATORY_CARE_PROVIDER_SITE_OTHER): Payer: Self-pay | Admitting: Family Medicine

## 2020-07-13 ENCOUNTER — Telehealth (INDEPENDENT_AMBULATORY_CARE_PROVIDER_SITE_OTHER): Payer: Medicare Other | Admitting: Family Medicine

## 2020-07-13 DIAGNOSIS — E559 Vitamin D deficiency, unspecified: Secondary | ICD-10-CM

## 2020-07-13 DIAGNOSIS — Z6835 Body mass index (BMI) 35.0-35.9, adult: Secondary | ICD-10-CM | POA: Diagnosis not present

## 2020-07-13 DIAGNOSIS — R7303 Prediabetes: Secondary | ICD-10-CM | POA: Diagnosis not present

## 2020-07-13 MED ORDER — VITAMIN D (ERGOCALCIFEROL) 1.25 MG (50000 UNIT) PO CAPS: 50000.0000 [IU] | ORAL_CAPSULE | ORAL | 0 refills | Status: DC

## 2020-07-14 DIAGNOSIS — J0100 Acute maxillary sinusitis, unspecified: Secondary | ICD-10-CM | POA: Diagnosis not present

## 2020-07-14 DIAGNOSIS — Z6841 Body Mass Index (BMI) 40.0 and over, adult: Secondary | ICD-10-CM | POA: Diagnosis not present

## 2020-07-14 DIAGNOSIS — J4540 Moderate persistent asthma, uncomplicated: Secondary | ICD-10-CM | POA: Diagnosis not present

## 2020-07-14 DIAGNOSIS — Z79899 Other long term (current) drug therapy: Secondary | ICD-10-CM | POA: Diagnosis not present

## 2020-07-18 NOTE — Progress Notes (Signed)
TeleHealth Visit:  Due to the COVID-19 pandemic, this visit was completed with telemedicine (audio/video) technology to reduce patient and provider exposure as well as to preserve personal protective equipment.   Dewana has verbally consented to this TeleHealth visit. The patient is located at home, the provider is located at the [facility] office. The participants in this visit include the listed provider and patient . The visit was conducted today via MyChart Video.   Chief Complaint: OBESITY Tamara Daugherty is here to discuss her progress with her obesity treatment plan along with follow-up of her obesity related diagnoses. Tamara Daugherty is on the Category 2 Plan and states she is following her eating plan approximately 20% of the time. Tamara Daugherty states she is not exercising.  Today's visit was #: 14 Starting weight: 319 lbs Starting date: 12/01/2019  Interim History: Tamara Daugherty has lost a few lbs in past few weeks due to illness. Tamara Daugherty has been mainly drinking liquids, soups, hot chocolate, and yogurt.Her husband tested positive for COVID last week. Tamara Daugherty is starting to feel slightly better.  Subjective:   1. Vitamin D deficiency  Tamara Daugherty is on Vitamin D prescription. She denies nausea, vomiting, or muscle weakness, but notes fatigue.   2. Prediabetes Tamara Daugherty has a diagnosis of prediabetes based on her elevated HgA1c and was informed this puts her at greater risk of developing diabetes. She continues to work on diet and exercise to decrease her risk of diabetes. She denies nausea or hypoglycemia. Tamara Daugherty's A1c was 5.8 and insulin 20.9. Tamara Daugherty had carb cravings while she was ill.  Lab Results  Component Value Date   HGBA1C 5.8 (H) 05/15/2020   Lab Results  Component Value Date   INSULIN 20.9 05/15/2020   INSULIN 19.4 12/01/2019      Assessment/Plan:   1. Vitamin D deficiency Low Vitamin D level contributes to fatigue and are associated with obesity, breast, and colon cancer. She agrees to continue to  take prescription Vitamin D @50 ,000 IU every week and will follow-up for routine testing of Vitamin D, at least 2-3 times per year to avoid over-replacement. We will refill Vitamin D 50,000 unit weekly #4 no refill.  - Vitamin D, Ergocalciferol, (DRISDOL) 1.25 MG (50000 UNIT) CAPS capsule; Take 1 capsule (50,000 Units total) by mouth every 7 (seven) days.  Dispense: 12 capsule; Refill: 0  2. Prediabetes Tamara Daugherty will continue to work on weight loss, exercise, and decreasing simple carbohydrates to help decrease the risk of diabetes. Tamara Daugherty will continue Cat plan as she starts to feel better.  3. Class 2 severe obesity with serious comorbidity and body mass index (BMI) of 35.0 to 35.9 in adult, unspecified obesity type [facility])  Tamara Daugherty is currently in the action stage of change. As such, her goal is to continue with weight loss efforts. She has agreed to the Category 2 Plan.   Exercise goals: No exercise has been prescribed at this time.  Behavioral modification strategies: increasing lean protein intake, increasing vegetables, meal planning and cooking strategies, keeping healthy foods in the home and planning for success.  Tamara Daugherty has agreed to follow-up with our clinic in 2 weeks. She was informed of the importance of frequent follow-up visits to maximize her success with intensive lifestyle modifications for her multiple health conditions.    Objective:   VITALS: Per patient if applicable, see vitals. GENERAL: Alert and in no acute distress. CARDIOPULMONARY: No increased WOB. Speaking in clear sentences.  PSYCH: Pleasant and cooperative. Speech normal rate and rhythm. Affect  is appropriate. Insight and judgement are appropriate. Attention is focused, linear, and appropriate.  NEURO: Oriented as arrived to appointment on time with no prompting.   Lab Results  Component Value Date   CREATININE 0.88 05/15/2020   BUN 13 05/15/2020   NA 135 05/15/2020   K 4.1 05/15/2020   CL 96 05/15/2020   CO2 27  05/15/2020   Lab Results  Component Value Date   ALT 15 05/15/2020   AST 16 05/15/2020   ALKPHOS 57 05/15/2020   BILITOT 0.4 05/15/2020   Lab Results  Component Value Date   HGBA1C 5.8 (H) 05/15/2020   HGBA1C 6.0 (H) 12/01/2019   Lab Results  Component Value Date   INSULIN 20.9 05/15/2020   INSULIN 19.4 12/01/2019   Lab Results  Component Value Date   TSH 1.610 12/01/2019   Lab Results  Component Value Date   CHOL 154 05/15/2020   HDL 66 05/15/2020   LDLCALC 77 05/15/2020   TRIG 49 05/15/2020   Lab Results  Component Value Date   WBC 8.6 12/01/2019   HGB 12.0 12/01/2019   HCT 37.7 12/01/2019   MCV 93 12/01/2019   PLT 310 12/01/2019   No results found for: IRON, TIBC, FERRITIN  Attestation Statements:   Reviewed by clinician on day of visit: allergies, medications, problem list, medical history, surgical history, family history, social history, and previous encounter notes.    I, Para March, am acting as transcriptionist for [name], MD.  I have reviewed the above documentation for accuracy and completeness, and I agree with the above. - Jinny Blossom, MD

## 2020-07-24 ENCOUNTER — Telehealth: Payer: Self-pay | Admitting: Pulmonary Disease

## 2020-07-24 NOTE — Telephone Encounter (Signed)
Berna Bue Order: 30mg  #1 prefilled syringe Ordered date: 07/24/20 Expected date of arrival: 07/25/20 Ordered by: Lecompte: Nigel Mormon

## 2020-07-25 NOTE — Telephone Encounter (Signed)
Fasenra Shipment Received:  30mg  #1 prefilled syringe Medication arrival date: 07/25/20 Lot #: [id number] Exp date: 07/24/2021 Received by: Elliot Dally

## 2020-07-31 DIAGNOSIS — R5383 Other fatigue: Secondary | ICD-10-CM | POA: Diagnosis not present

## 2020-07-31 DIAGNOSIS — R059 Cough, unspecified: Secondary | ICD-10-CM | POA: Diagnosis not present

## 2020-07-31 DIAGNOSIS — Z20828 Contact with and (suspected) exposure to other viral communicable diseases: Secondary | ICD-10-CM | POA: Diagnosis not present

## 2020-08-01 ENCOUNTER — Ambulatory Visit (INDEPENDENT_AMBULATORY_CARE_PROVIDER_SITE_OTHER): Payer: Self-pay | Admitting: Family Medicine

## 2020-08-02 ENCOUNTER — Telehealth: Payer: Self-pay | Admitting: Pulmonary Disease

## 2020-08-02 MED ORDER — DOXYCYCLINE HYCLATE 100 MG PO TABS: 100.0000 mg | ORAL_TABLET | Freq: Two times a day (BID) | ORAL | 0 refills | Status: DC

## 2020-08-02 MED ORDER — PREDNISONE 10 MG PO TABS: ORAL_TABLET | ORAL | 0 refills | Status: AC

## 2020-08-02 NOTE — Telephone Encounter (Signed)
Please advise on patient mychart message   I have been experiencing the following symptoms since Friday: Coughing Congestion  Wheezing Shortness of breath Weak/no energy Headache  My Covid Test came back negative. The last time I had these symptoms, I ended up with pneumonia.   What do you advise?  Please advise as Dr. Halford Chessman is on vacation

## 2020-08-02 NOTE — Telephone Encounter (Signed)
Called and spoke with Patient.  Patient stated she was not feeling well. Patient rescheduled for Fasenra injection 08/07/20 at 1030.

## 2020-08-03 ENCOUNTER — Ambulatory Visit: Payer: Medicare Other

## 2020-08-03 ENCOUNTER — Encounter (INDEPENDENT_AMBULATORY_CARE_PROVIDER_SITE_OTHER): Payer: Self-pay

## 2020-08-03 ENCOUNTER — Telehealth (INDEPENDENT_AMBULATORY_CARE_PROVIDER_SITE_OTHER): Payer: Medicare Other | Admitting: Family Medicine

## 2020-08-03 ENCOUNTER — Encounter (INDEPENDENT_AMBULATORY_CARE_PROVIDER_SITE_OTHER): Payer: Self-pay | Admitting: Family Medicine

## 2020-08-03 DIAGNOSIS — I10 Essential (primary) hypertension: Secondary | ICD-10-CM | POA: Diagnosis not present

## 2020-08-03 DIAGNOSIS — J329 Chronic sinusitis, unspecified: Secondary | ICD-10-CM | POA: Diagnosis not present

## 2020-08-03 DIAGNOSIS — Z6841 Body Mass Index (BMI) 40.0 and over, adult: Secondary | ICD-10-CM

## 2020-08-03 DIAGNOSIS — B9689 Other specified bacterial agents as the cause of diseases classified elsewhere: Secondary | ICD-10-CM | POA: Diagnosis not present

## 2020-08-07 ENCOUNTER — Ambulatory Visit (INDEPENDENT_AMBULATORY_CARE_PROVIDER_SITE_OTHER): Payer: Medicare Other

## 2020-08-07 ENCOUNTER — Other Ambulatory Visit: Payer: Self-pay

## 2020-08-07 DIAGNOSIS — J4550 Severe persistent asthma, uncomplicated: Secondary | ICD-10-CM | POA: Diagnosis not present

## 2020-08-07 MED ORDER — BENRALIZUMAB 30 MG/ML ~~LOC~~ SOSY
30.0000 mg | PREFILLED_SYRINGE | Freq: Once | SUBCUTANEOUS | Status: AC
Start: 2020-08-07 — End: 2020-08-07
  Administered 2020-08-07: 30 mg via SUBCUTANEOUS

## 2020-08-07 NOTE — Progress Notes (Signed)
Have you been hospitalized within the last 10 days?  No Do you have a fever?  No Do you have a cough?  No Do you have a headache or sore throat? No  

## 2020-08-07 NOTE — Progress Notes (Signed)
TeleHealth Visit:  Due to the COVID-19 pandemic, this visit was completed with telemedicine (audio/video) technology to reduce patient and provider exposure as well as to preserve personal protective equipment.   Tamara Daugherty has verbally consented to this TeleHealth visit. The patient is located at home, the provider is located at the [facility] office. The participants in this visit include the listed provider and patient. The visit was conducted today via video.  Chief Complaint: OBESITY Tamara Daugherty is here to discuss her progress with her obesity treatment plan along with follow-up of her obesity related diagnoses. Tamara Daugherty is on the Category 2 Plan and states she is following her eating plan approximately 10% of the time. Tamara Daugherty states she is doing 0 minutes 0 times per week.  Today's visit was #: 15 Starting weight: 319 lbs Starting date: 12/01/2019  Interim History: Pt is still feeling ill. She is struggling with significant congestion and now on prednisone, an antibiotic, and cough syrup. She really is only tolerating liquids and hot things. She is not eating much.  Subjective:   1. Essential hypertension  Pt denies chest pain, chest pressure and headache. She is on diltiazem. Her BP has previously been controlled.   BP Readings from Last 3 Encounters:  05/31/20 133/77  05/15/20 130/75  05/03/20 122/74    2. Bacterial sinusitis Pt reports prolonged illness. She is now on prednisone, antibiotic, and a cough medication. She is still feeling very poorly.   Assessment/Plan:   1. Essential hypertension Tamara Daugherty is working on healthy weight loss and exercise to improve blood pressure control. We will watch for signs of hypotension as she continues her lifestyle modifications. Follow up at next appointment.  2. Bacterial sinusitis Pt is encouraged to be mindful of PO intake, especially fluids and rest. She was told if she does not improve, to seek re-evaluation with PCP.  3. Class 3  severe obesity with serious comorbidity and body mass index (BMI) of 50.0 to 59.9 in adult, unspecified obesity type [facility]) Tamara Daugherty is currently in the action stage of change. As such, her goal is to continue with weight loss efforts. She has agreed to the Category 2 Plan.   Exercise goals: No exercise has been prescribed at this time.  Behavioral modification strategies: increasing lean protein intake, meal planning and cooking strategies, keeping healthy foods in the home and planning for success.  Tamara Daugherty has agreed to follow-up with our clinic in 3 weeks. She was informed of the importance of frequent follow-up visits to maximize her success with intensive lifestyle modifications for her multiple health conditions.  Objective:   VITALS: Per patient if applicable, see vitals. GENERAL: Alert and in no acute distress. CARDIOPULMONARY: No increased WOB. Speaking in clear sentences.  PSYCH: Pleasant and cooperative. Speech normal rate and rhythm. Affect is appropriate. Insight and judgement are appropriate. Attention is focused, linear, and appropriate.  NEURO: Oriented as arrived to appointment on time with no prompting.   Lab Results  Component Value Date   CREATININE 0.88 05/15/2020   BUN 13 05/15/2020   NA 135 05/15/2020   K 4.1 05/15/2020   CL 96 05/15/2020   CO2 27 05/15/2020   Lab Results  Component Value Date   ALT 15 05/15/2020   AST 16 05/15/2020   ALKPHOS 57 05/15/2020   BILITOT 0.4 05/15/2020   Lab Results  Component Value Date   HGBA1C 5.8 (H) 05/15/2020   HGBA1C 6.0 (H) 12/01/2019   Lab Results  Component Value Date  INSULIN 20.9 05/15/2020   INSULIN 19.4 12/01/2019   Lab Results  Component Value Date   TSH 1.610 12/01/2019   Lab Results  Component Value Date   CHOL 154 05/15/2020   HDL 66 05/15/2020   LDLCALC 77 05/15/2020   TRIG 49 05/15/2020   Lab Results  Component Value Date   WBC 8.6 12/01/2019   HGB 12.0 12/01/2019   HCT 37.7 12/01/2019   MCV  93 12/01/2019   PLT 310 12/01/2019   No results found for: IRON, TIBC, FERRITIN  Attestation Statements:   Reviewed by clinician on day of visit: allergies, medications, problem list, medical history, surgical history, family history, social history, and previous encounter notes.  Coral Ceo, am acting as transcriptionist for [name], MD.   I have reviewed the above documentation for accuracy and completeness, and I agree with the above. - Jinny Blossom, MD

## 2020-08-17 ENCOUNTER — Ambulatory Visit (INDEPENDENT_AMBULATORY_CARE_PROVIDER_SITE_OTHER): Payer: Medicare Other | Admitting: Family Medicine

## 2020-08-17 ENCOUNTER — Other Ambulatory Visit: Payer: Self-pay

## 2020-08-17 ENCOUNTER — Encounter (INDEPENDENT_AMBULATORY_CARE_PROVIDER_SITE_OTHER): Payer: Self-pay | Admitting: Family Medicine

## 2020-08-17 VITALS — BP 125/80 | HR 70 | Temp 97.9°F | Ht 65.0 in | Wt 307.0 lb

## 2020-08-17 DIAGNOSIS — R7303 Prediabetes: Secondary | ICD-10-CM

## 2020-08-17 DIAGNOSIS — Z6841 Body Mass Index (BMI) 40.0 and over, adult: Secondary | ICD-10-CM

## 2020-08-20 ENCOUNTER — Other Ambulatory Visit (HOSPITAL_COMMUNITY): Payer: Self-pay | Admitting: Pharmacy Technician

## 2020-08-21 ENCOUNTER — Encounter (INDEPENDENT_AMBULATORY_CARE_PROVIDER_SITE_OTHER): Payer: Self-pay | Admitting: Family Medicine

## 2020-08-21 DIAGNOSIS — R7303 Prediabetes: Secondary | ICD-10-CM | POA: Insufficient documentation

## 2020-08-21 NOTE — Progress Notes (Signed)
Chief Complaint:   OBESITY Tamara Daugherty is here to discuss her progress with her obesity treatment plan along with follow-up of her obesity related diagnoses. Tamara Daugherty is on the Category 2 Plan and states she is following her eating plan approximately 40% of the time. Tamara Daugherty states she is doing 0 minutes 0 times per week.  Today's visit was #: 16 Starting weight: 319 lbs Starting date: 12/01/2019 Today's weight: 307 lbs Today's date: 08/17/2020 Total lbs lost to date: 12 lbs Total lbs lost since last in-office visit: 0  Interim History: Tamara Daugherty has been ill with an asthma flare and is up 3 lbs today. She has been on a prednisone burst and her appetite is up. She tends to eat a combo meal for breakfast/lunch and then dinner at 7 PM. She usually has breakfast food for brunch and then skips lunch. She drinks sweet tea and orange juice regularly.   Subjective:   1. Pre-diabetes Tamara Daugherty is not on Metformin. Her last A1c was 5.8 on 05/15/2020.  Lab Results  Component Value Date   HGBA1C 5.8 (H) 05/15/2020   Lab Results  Component Value Date   INSULIN 20.9 05/15/2020   INSULIN 19.4 12/01/2019    Assessment/Plan:   1. Pre-diabetes Tamara Daugherty will continue to work on weight loss, exercise, and decreasing simple carbohydrates to help decrease the risk of diabetes. Work on reducing sugar sweetened beverages.  2. Class 3 severe obesity with serious comorbidity and body mass index (BMI) of 50.0 to 59.9 in adult, unspecified obesity type [facility]) Tamara Daugherty is currently in the action stage of change. As such, her goal is to continue with weight loss efforts. She has agreed to the Category 2 Plan.   Have a snack with 20 grams of protein between brunch and supper. Discussed options for this snack.  Exercise goals: No exercise has been prescribed at this time.  Behavioral modification strategies: increasing lean protein intake, decreasing simple carbohydrates, decreasing liquid calories and no skipping meals.  Tamara Daugherty  has agreed to follow-up with our clinic in 2-3 weeks.   Objective:   Blood pressure 125/80, pulse 70, temperature 97.9 F (36.6 C), height 5\' 5"  (1.651 m), weight (!) 307 lb (139.3 kg), SpO2 96 %. Body mass index is 51.09 kg/m.  General: Cooperative, alert, well developed, in no acute distress. HEENT: Conjunctivae and lids unremarkable. Cardiovascular: Regular rhythm.  Lungs: Normal work of breathing. Neurologic: No focal deficits.   Lab Results  Component Value Date   CREATININE 0.88 05/15/2020   BUN 13 05/15/2020   NA 135 05/15/2020   K 4.1 05/15/2020   CL 96 05/15/2020   CO2 27 05/15/2020   Lab Results  Component Value Date   ALT 15 05/15/2020   AST 16 05/15/2020   ALKPHOS 57 05/15/2020   BILITOT 0.4 05/15/2020   Lab Results  Component Value Date   HGBA1C 5.8 (H) 05/15/2020   HGBA1C 6.0 (H) 12/01/2019   Lab Results  Component Value Date   INSULIN 20.9 05/15/2020   INSULIN 19.4 12/01/2019   Lab Results  Component Value Date   TSH 1.610 12/01/2019   Lab Results  Component Value Date   CHOL 154 05/15/2020   HDL 66 05/15/2020   LDLCALC 77 05/15/2020   TRIG 49 05/15/2020   Lab Results  Component Value Date   WBC 8.6 12/01/2019   HGB 12.0 12/01/2019   HCT 37.7 12/01/2019   MCV 93 12/01/2019   PLT 310 12/01/2019   No results found  for: IRON, TIBC, FERRITIN  Obesity Behavioral Intervention:   Approximately 15 minutes were spent on the discussion below.  ASK: We discussed the diagnosis of obesity with Tamara Daugherty today and Tamara Daugherty agreed to give [name] permission to discuss obesity behavioral modification therapy today.  ASSESS: Tamara Daugherty has the diagnosis of obesity and her BMI today is 51.2. Tamara Daugherty is in the action stage of change.   ADVISE: Tamara Daugherty was educated on the multiple health risks of obesity as well as the benefit of weight loss to improve her health. She was advised of the need for long term treatment and the importance of lifestyle modifications to improve her  current health and to decrease her risk of future health problems.  AGREE: Multiple dietary modification options and treatment options were discussed and Tamara Daugherty agreed to follow the recommendations documented in the above note.  ARRANGE: Tamara Daugherty was educated on the importance of frequent visits to treat obesity as outlined per CMS and USPSTF guidelines and agreed to schedule her next follow up appointment today.  Attestation Statements:   Reviewed by clinician on day of visit: allergies, medications, problem list, medical history, surgical history, family history, social history, and previous encounter notes.  Tamara Daugherty, am acting as Location manager for [employer], [place].  I have reviewed the above documentation for accuracy and completeness, and I agree with the above. -  Georgianne Fick, FNP

## 2020-09-06 ENCOUNTER — Other Ambulatory Visit: Payer: Self-pay

## 2020-09-06 ENCOUNTER — Encounter (INDEPENDENT_AMBULATORY_CARE_PROVIDER_SITE_OTHER): Payer: Self-pay | Admitting: Family Medicine

## 2020-09-06 ENCOUNTER — Ambulatory Visit (INDEPENDENT_AMBULATORY_CARE_PROVIDER_SITE_OTHER): Payer: Medicare Other | Admitting: Family Medicine

## 2020-09-06 VITALS — BP 108/65 | HR 76 | Temp 97.6°F | Ht 65.0 in | Wt 307.0 lb

## 2020-09-06 DIAGNOSIS — Z6841 Body Mass Index (BMI) 40.0 and over, adult: Secondary | ICD-10-CM

## 2020-09-06 DIAGNOSIS — R7303 Prediabetes: Secondary | ICD-10-CM | POA: Diagnosis not present

## 2020-09-11 MED ORDER — ALBUTEROL SULFATE HFA 108 (90 BASE) MCG/ACT IN AERS: 2.0000 | INHALATION_SPRAY | Freq: Four times a day (QID) | RESPIRATORY_TRACT | 3 refills | Status: DC | PRN

## 2020-09-11 NOTE — Addendum Note (Signed)
Addended by: Lorretta Harp on: 09/11/2020 12:39 PM   Modules accepted: Orders

## 2020-09-12 ENCOUNTER — Encounter (INDEPENDENT_AMBULATORY_CARE_PROVIDER_SITE_OTHER): Payer: Self-pay | Admitting: Family Medicine

## 2020-09-12 NOTE — Progress Notes (Signed)
Chief Complaint:   OBESITY Tamara Daugherty is here to discuss her progress with her obesity treatment plan along with follow-up of her obesity related diagnoses. Tamara Daugherty is on the Category 2 Plan and states she is following her eating plan approximately 50% of the time. Tamara Daugherty states she is doing 0 minutes 0 times per week.  Today's visit was #: 28 Starting weight: 319 lbs Starting date: 12/01/2019 Today's weight: 307 lbs Today's date: 09/06/2020 Total lbs lost to date: 12 Total lbs lost since last in-office visit: 0  Interim History: Tamara Daugherty is stressed due to helping her sister who is ill. She has been driving her to her appointments and has been on the road quite a bit. She is helping her with activities of daily living and chores. This added stressor has made it more difficult for her to adhere to the meal plan. Tamara Daugherty has been supplementing her protein intake with cheese and milk. She has cut back on soda.  Subjective:   1. Pre-diabetes Tamara Daugherty's last A1c was 5.8, and she is not on metformin.   Lab Results  Component Value Date   HGBA1C 5.8 (H) 05/15/2020   Lab Results  Component Value Date   INSULIN 20.9 05/15/2020   INSULIN 19.4 12/01/2019   Assessment/Plan:   1. Pre-diabetes Tamara Daugherty will continue her meal plan, and will continue to work on weight loss, exercise, and decreasing simple carbohydrates to help decrease the risk of diabetes.   2. Class 3 severe obesity with serious comorbidity and body mass index (BMI) of 50.0 to 59.9 in adult, unspecified obesity type Tamara Daugherty) Tamara Daugherty is currently in the action stage of change. As such, her goal is to continue with weight loss efforts. She has agreed to the Category 2 Plan.   Tamara Daugherty will pack a snack bag to take with her when driving her sister to appointments.  Exercise goals: No exercise has been prescribed at this time.  Behavioral modification strategies: increasing lean protein intake, meal planning and cooking strategies and better snacking  choices.  Tamara Daugherty has agreed to follow-up with our clinic in 3 weeks.   Objective:   Blood pressure 108/65, pulse 76, temperature 97.6 F (36.4 C), height 5\' 5"  (1.651 m), weight (!) 307 lb (139.3 kg), SpO2 95 %. Body mass index is 51.09 kg/m.  General: Cooperative, alert, well developed, in no acute distress. HEENT: Conjunctivae and lids unremarkable. Cardiovascular: Regular rhythm.  Lungs: Normal work of breathing. Neurologic: No focal deficits.   Lab Results  Component Value Date   CREATININE 0.88 05/15/2020   BUN 13 05/15/2020   NA 135 05/15/2020   K 4.1 05/15/2020   CL 96 05/15/2020   CO2 27 05/15/2020   Lab Results  Component Value Date   ALT 15 05/15/2020   AST 16 05/15/2020   ALKPHOS 57 05/15/2020   BILITOT 0.4 05/15/2020   Lab Results  Component Value Date   HGBA1C 5.8 (H) 05/15/2020   HGBA1C 6.0 (H) 12/01/2019   Lab Results  Component Value Date   INSULIN 20.9 05/15/2020   INSULIN 19.4 12/01/2019   Lab Results  Component Value Date   TSH 1.610 12/01/2019   Lab Results  Component Value Date   CHOL 154 05/15/2020   HDL 66 05/15/2020   LDLCALC 77 05/15/2020   TRIG 49 05/15/2020   Lab Results  Component Value Date   WBC 8.6 12/01/2019   HGB 12.0 12/01/2019   HCT 37.7 12/01/2019   MCV 93 12/01/2019  PLT 310 12/01/2019   No results found for: IRON, TIBC, FERRITIN  Obesity Behavioral Intervention:   Approximately 15 minutes were spent on the discussion below.  ASK: We discussed the diagnosis of obesity with Tamara Daugherty today and Tamara Daugherty agreed to give [name] permission to discuss obesity behavioral modification therapy today.  ASSESS: Tamara Daugherty has the diagnosis of obesity and her BMI today is 51.09. Tamara Daugherty is in the action stage of change.   ADVISE: Tamara Daugherty was educated on the multiple health risks of obesity as well as the benefit of weight loss to improve her health. She was advised of the need for long term treatment and the importance of lifestyle  modifications to improve her current health and to decrease her risk of future health problems.  AGREE: Multiple dietary modification options and treatment options were discussed and Tamara Daugherty agreed to follow the recommendations documented in the above note.  ARRANGE: Tamara Daugherty was educated on the importance of frequent visits to treat obesity as outlined per CMS and USPSTF guidelines and agreed to schedule her next follow up appointment today.  Attestation Statements:   Reviewed by clinician on day of visit: allergies, medications, problem list, medical history, surgical history, family history, social history, and previous encounter notes.   Wilhemena Durie, am acting as Location manager for [employer], FNP-C.  I have reviewed the above documentation for accuracy and completeness, and I agree with the above. -  Georgianne Fick, FNP

## 2020-09-25 ENCOUNTER — Other Ambulatory Visit: Payer: Self-pay

## 2020-09-25 ENCOUNTER — Encounter: Payer: Self-pay | Admitting: Pulmonary Disease

## 2020-09-25 ENCOUNTER — Ambulatory Visit (INDEPENDENT_AMBULATORY_CARE_PROVIDER_SITE_OTHER): Payer: Medicare Other | Admitting: Pulmonary Disease

## 2020-09-25 VITALS — BP 134/74 | HR 102 | Temp 97.2°F | Ht 65.0 in | Wt 313.2 lb

## 2020-09-25 DIAGNOSIS — R0600 Dyspnea, unspecified: Secondary | ICD-10-CM | POA: Diagnosis not present

## 2020-09-25 DIAGNOSIS — R0609 Other forms of dyspnea: Secondary | ICD-10-CM

## 2020-09-25 DIAGNOSIS — R000 Tachycardia, unspecified: Secondary | ICD-10-CM | POA: Diagnosis not present

## 2020-09-25 MED ORDER — TORSEMIDE 20 MG PO TABS: 20.0000 mg | ORAL_TABLET | ORAL | 1 refills | Status: DC | PRN

## 2020-09-25 NOTE — Progress Notes (Signed)
[facility]  Chief Complaint  Patient presents with  . Follow-up    Shortness of breath with activity for past two weeks    Constitutional:  BP 134/74 (BP Location: Left Arm, Cuff Size: Normal)   Pulse (!) 102   Temp (!) 97.2 F (36.2 C) (Temporal)   Ht 5\' 5"  (1.651 m)   Wt (!) 313 lb 3.2 oz (142.1 kg)   SpO2 96% Comment: Room air  BMI 52.12 kg/m   Past Medical History:  Anemia, Anxiety, OA, GERD, HA, Colon polyps, HLD, HTN, Hypothyroidism, PNA, Vertigo, TIA  Past Surgical History:  Tamara Daugherty  has a past surgical history that includes Tonsillectomy; Abdominal hysterectomy; Back surgery; Knee arthroscopy; Breast surgery; Cardiac catheterization; trigger thumb; carpel tunnel (Right); Total knee arthroplasty (Left, 06/21/2014); Colonoscopy; Esophagogastroduodenoscopy; and Total knee arthroplasty (Right, 03/21/2015).  Brief Summary:  Tamara Daugherty is a 71 y.o. female with eosinophilic asthma and obstructive sleep apnea.      Subjective:   Tamara Daugherty has noticed more trouble with breathing over the past 2 to 3 weeks.  Tamara Daugherty feels like Tamara Daugherty has been retaining fluid.  Tamara Daugherty has been getting cough with clear sputum.  Tamara Daugherty isn't aware of having chest pain.  Tamara Daugherty denies fever, hemoptysis, or sinus congestion.  Tamara Daugherty tried using her nebulizer medications again, but these didn't improve her symptoms.  Tamara Daugherty hasn't been using demadex recently.  Tamara Daugherty uses CPAP nightly.  ECG today showed tachycardia with PVCs.  Physical Exam:   Appearance - well kempt   ENMT - no sinus tenderness, no oral exudate, no LAN, Mallampati 4 airway, no stridor  Respiratory - equal breath sounds bilaterally, no wheezing or rales  CV - irregular, tachcardic  Ext - ankle edema  Skin - no rashes  Psych - normal mood and affect   Pulmonary testing:   RAST 08/30/16 >> dust mights, IgE 121  PFT 09/27/16 >> FEV1 1.56 (71%), FEV1% 81, TLC 3.46 (64%), DLCO 69%  PFT 01/09/17 >> FEV1 1.67  (76%), FEV1% 75, DLCO 65%  PFT 04/23/18 >> FEV1 1.61 (76%), FEV1% 77, TLC 4.63 (87%), DLCO 69%  Chest Imaging:   HRCT chest 01/16/17 >> mild tracheomalacia, mild CM, PA 4.1 cm  Sleep Tests:   PSG 12/16/13 ([facility]) >> moderate OSA  CPAP 09/26/19 to 10/25/19 >> used on 30 of 30 nights with average 7 hrs 27 min.  Average AHI 2.3 with CPAP 8 cm H2O  Cardiac Tests:   Echo 01/16/17 >> EF 55 to 60%, mild MR  Social History:  Tamara Daugherty  reports that Tamara Daugherty is a non-smoker but has been exposed to tobacco smoke. Tamara Daugherty has never used smokeless tobacco. Tamara Daugherty reports current alcohol use. Tamara Daugherty reports that Tamara Daugherty does not use drugs.  Family History:  Her family history includes Cancer in her cousin, father, and maternal aunt; Heart disease in her mother and paternal aunt; Hyperlipidemia in her mother; Hypertension in her mother; Lung cancer in her father and paternal uncle; Rheum arthritis in her mother; Scleroderma in her mother; Stroke in her brother.     Assessment/Plan:   Dyspnea on exertion. - previously followed by Dr. Jacinta Shoe with Cardiology in [place]; will refer to cardiology for further assessment - advised her to use torsemide daily for next three days - will check CMET, Mg, CBC  Eosinophilic asthma. - started on fasenra in May 2020 - continue singulair - Tamara Daugherty uses performist, pulmicort, and albuterol prn - nebulizer medication is more cost effective for  her compare to inhaler therapy  Allergic rhinitis. - continue singulair - prn azelastine, fluticasone, loratadine  Obstructive sleep apnea. - Tamara Daugherty is compliant with CPAP and reports benefit - Tamara Daugherty uses [employer] as her DME - continue CPAP 8 cm H2O - will call her with results of download  Obesity. - discussed options to assist with weight loss   Time Spent Involved in Patient Care on Day of Examination:  44 minutes  Follow up:  Patient Instructions  Lab tests today  Will arrange for referral to Cardiology in [place]  (fluid pill) daily for next 3 days  Follow up in 6 to 8 weeks   Medication List:   Allergies as of 09/25/2020      Reactions   Bee Venom Anaphylaxis   Diphenhydramine Hcl Other (See Comments)   Paradoxical reaction/ becomes hyper for days   Diphenhydramine Hcl    Other reaction(s): Other - See Comments Paradoxical reaction/ becomes hyper for days   Doxycycline    Feels bad   Oxycodone-acetaminophen    Penicillins Hives   No reaction to Ancef.  OK to give.     Percocet [oxycodone-acetaminophen] Other (See Comments)   hallucination   Propoxyphene    Sulfa Antibiotics Itching      Medication List       Accurate as of September 25, 2020  4:11 PM. If you have any questions, ask your nurse or doctor.        STOP taking these medications   doxycycline 100 MG tablet Commonly known as: VIBRA-TABS Stopped by: Chesley Mires, MD     TAKE these medications   AeroChamber MV inhaler Use as instructed   albuterol 108 (90 Base) MCG/ACT inhaler Commonly known as: VENTOLIN HFA Inhale 2 puffs into the lungs every 6 (six) hours as needed for wheezing or shortness of breath.   atorvastatin 10 MG tablet Commonly known as: LIPITOR Take 10 mg by mouth daily.   azelastine 0.1 % nasal spray Commonly known as: ASTELIN Place 1 spray into both nostrils 2 (two) times daily. Use in each nostril as directed   budesonide 0.25 MG/2ML nebulizer solution Commonly known as: PULMICORT Inhale into the lungs.   budesonide 0.5 MG/2ML nebulizer solution Commonly known as: PULMICORT USE 1 VIAL  IN  NEBULIZER TWICE  DAILY - at 10 AM and 5 PM. Rinse mouth after treatment   clopidogrel 75 MG tablet Commonly known as: PLAVIX Take 1 tablet by mouth daily.   diltiazem 240 MG 24 hr capsule Commonly known as: TIAZAC Take by mouth.   EPINEPHrine 0.3 mg/0.3 mL Soaj injection Commonly known as: EPI-PEN Inject 0.3 mg into the muscle once.   FISH OIL PO Take 1 capsule by mouth 2 (two) times daily.    fluticasone 50 MCG/ACT nasal spray Commonly known as: FLONASE Place 1 spray into both nostrils 2 (two) times daily as needed for allergies or rhinitis.   guaiFENesin 600 MG 12 hr tablet Commonly known as: MUCINEX Take 600 mg by mouth 2 (two) times daily as needed for to loosen phlegm.   ipratropium-albuterol 0.5-2.5 (3) MG/3ML Soln Commonly known as: DUONEB Inhale into the lungs.   levothyroxine 50 MCG tablet Commonly known as: SYNTHROID Take 50 mcg by mouth daily before breakfast.   loratadine 10 MG tablet Commonly known as: CLARITIN Take 10 mg by mouth daily as needed for allergies.   montelukast 10 MG tablet Commonly known as: SINGULAIR Take 1 tablet (10 mg total) by mouth at bedtime.  multivitamin with minerals tablet Take 1 tablet by mouth daily.   olmesartan-hydrochlorothiazide 40-25 MG tablet Commonly known as: BENICAR HCT Take 1 tablet by mouth daily.   pantoprazole 40 MG tablet Commonly known as: PROTONIX Take 40 mg by mouth daily.   Perforomist 20 MCG/2ML nebulizer solution Generic drug: formoterol USE 1 VIAL  IN  NEBULIZER TWICE  DAILY - morning and evening   potassium chloride 10 MEQ CR capsule Commonly known as: MICRO-K Take 30 mEq by mouth daily.   pyridOXINE 50 MG tablet Commonly known as: B-6 Take 50 mg by mouth daily.   torsemide 20 MG tablet Commonly known as: DEMADEX Take 1 tablet (20 mg total) by mouth as needed.   TURMERIC PO Take 1 capsule by mouth daily.   UNABLE TO FIND Nebulizer machine  Diagnosis - moderate persistent asthma   Vitamin D (Ergocalciferol) 1.25 MG (50000 UNIT) Caps capsule Commonly known as: DRISDOL Take 1 capsule (50,000 Units total) by mouth every 7 (seven) days.   Vitron-C 65-125 MG Tabs Generic drug: Iron-Vitamin C Take by mouth.       Signature:  Chesley Mires, MD [place] Pager - [phone] 09/25/2020, 4:11 PM

## 2020-09-25 NOTE — Addendum Note (Signed)
Addended by: Suzzanne Cloud E on: 09/25/2020 04:18 PM   Modules accepted: Orders

## 2020-09-25 NOTE — Patient Instructions (Signed)
Lab tests today  Will arrange for referral to Cardiology in [place] (fluid pill) daily for next 3 days  Follow up in 6 to 8 weeks

## 2020-09-26 DIAGNOSIS — R0609 Other forms of dyspnea: Secondary | ICD-10-CM

## 2020-09-26 DIAGNOSIS — R0600 Dyspnea, unspecified: Secondary | ICD-10-CM

## 2020-09-26 DIAGNOSIS — R000 Tachycardia, unspecified: Secondary | ICD-10-CM

## 2020-09-26 LAB — CBC
HCT: 31.1 % — ABNORMAL LOW (ref 36.0–46.0)
Hemoglobin: 10.2 g/dL — ABNORMAL LOW (ref 12.0–15.0)
MCHC: 32.9 g/dL (ref 30.0–36.0)
MCV: 84.8 fl (ref 78.0–100.0)
Platelets: 346 10*3/uL (ref 150.0–400.0)
RBC: 3.66 Mil/uL — ABNORMAL LOW (ref 3.87–5.11)
RDW: 17.7 % — ABNORMAL HIGH (ref 11.5–15.5)
WBC: 7.4 10*3/uL (ref 4.0–10.5)

## 2020-09-26 LAB — COMPREHENSIVE METABOLIC PANEL
ALT: 13 U/L (ref 0–35)
AST: 15 U/L (ref 0–37)
Albumin: 3.9 g/dL (ref 3.5–5.2)
Alkaline Phosphatase: 49 U/L (ref 39–117)
BUN: 14 mg/dL (ref 6–23)
CO2: 35 mEq/L — ABNORMAL HIGH (ref 19–32)
Calcium: 9.3 mg/dL (ref 8.4–10.5)
Chloride: 95 mEq/L — ABNORMAL LOW (ref 96–112)
Creatinine, Ser: 0.96 mg/dL (ref 0.40–1.20)
GFR: 61.12 mL/min (ref >60.00)
Glucose, Bld: 87 mg/dL (ref 70–99)
Potassium: 3.9 mEq/L (ref 3.5–5.1)
Sodium: 133 mEq/L — ABNORMAL LOW (ref 135–145)
Total Bilirubin: 0.3 mg/dL (ref 0.2–1.2)
Total Protein: 8.3 g/dL (ref 6.0–8.3)

## 2020-09-26 LAB — MAGNESIUM: Magnesium: 1.9 mg/dL (ref 1.5–2.5)

## 2020-09-27 ENCOUNTER — Ambulatory Visit (INDEPENDENT_AMBULATORY_CARE_PROVIDER_SITE_OTHER): Payer: Medicare Other | Admitting: Family Medicine

## 2020-09-27 NOTE — Telephone Encounter (Signed)
mychart message sent back by pt. Dr. Halford Chessman, please advise.

## 2020-09-28 NOTE — Telephone Encounter (Signed)
Please see if her cardiology referral can be scheduled for provider in [facility] for an earlier appointment.

## 2020-09-28 NOTE — Telephone Encounter (Signed)
[facility] please advise on message from Dr. Beckey Rutter, MD 1 hour ago (8:21 AM)       Please see if her cardiology referral can be scheduled for provider in [place] in the [place] for an earlier appointment.

## 2020-09-29 NOTE — Telephone Encounter (Signed)
I called [facility] in [place] to see if they had sooner appt than 5/25 and they did not.  They can do scheduling for all locations.  Earliest appt is 5/13 at 9:40 with Dr Oswaldo Milian at [place] office in [place].  I took that appt.  They are putting pt on cancellation list for Dr Gardiner Rhyme.  Pt can call to any of their locations at any time to see if there has been any openings in any office.  Will route to triage so pt can be made aware thru MyChart that she has been moved up to 5/13 with Dr Gardiner Rhyme at this time.

## 2020-09-29 NOTE — Telephone Encounter (Signed)
Dr. Halford Chessman please advise about irregular heartbeat.  Thanks,   Verdene Lennert

## 2020-10-02 ENCOUNTER — Encounter (INDEPENDENT_AMBULATORY_CARE_PROVIDER_SITE_OTHER): Payer: Self-pay | Admitting: Adult Health

## 2020-10-02 ENCOUNTER — Other Ambulatory Visit: Payer: Self-pay

## 2020-10-02 ENCOUNTER — Ambulatory Visit (INDEPENDENT_AMBULATORY_CARE_PROVIDER_SITE_OTHER): Payer: Medicare Other

## 2020-10-02 ENCOUNTER — Ambulatory Visit (INDEPENDENT_AMBULATORY_CARE_PROVIDER_SITE_OTHER): Payer: Medicare Other | Admitting: Adult Health

## 2020-10-02 VITALS — BP 123/76 | HR 80 | Temp 97.5°F | Resp 22

## 2020-10-02 VITALS — BP 118/75 | HR 71 | Temp 97.6°F | Ht 65.0 in | Wt 303.0 lb

## 2020-10-02 DIAGNOSIS — E559 Vitamin D deficiency, unspecified: Secondary | ICD-10-CM | POA: Diagnosis not present

## 2020-10-02 DIAGNOSIS — R7303 Prediabetes: Secondary | ICD-10-CM | POA: Diagnosis not present

## 2020-10-02 DIAGNOSIS — R0609 Other forms of dyspnea: Secondary | ICD-10-CM

## 2020-10-02 DIAGNOSIS — Z6841 Body Mass Index (BMI) 40.0 and over, adult: Secondary | ICD-10-CM

## 2020-10-02 DIAGNOSIS — R0600 Dyspnea, unspecified: Secondary | ICD-10-CM

## 2020-10-02 DIAGNOSIS — J4550 Severe persistent asthma, uncomplicated: Secondary | ICD-10-CM

## 2020-10-02 MED ORDER — BENRALIZUMAB 30 MG/ML ~~LOC~~ SOSY
30.0000 mg | PREFILLED_SYRINGE | Freq: Once | SUBCUTANEOUS | Status: AC
  Administered 2020-10-02: 30 mg via SUBCUTANEOUS

## 2020-10-02 MED ORDER — EPINEPHRINE 0.3 MG/0.3ML IJ SOAJ: 0.3000 mg | Freq: Once | INTRAMUSCULAR | Status: DC | PRN

## 2020-10-02 MED ORDER — VITAMIN D (ERGOCALCIFEROL) 1.25 MG (50000 UNIT) PO CAPS: 50000.0000 [IU] | ORAL_CAPSULE | ORAL | 0 refills | Status: AC

## 2020-10-02 MED ORDER — FAMOTIDINE IN NACL 20-0.9 MG/50ML-% IV SOLN: 20.0000 mg | Freq: Once | INTRAVENOUS | Status: DC | PRN

## 2020-10-02 MED ORDER — ALBUTEROL SULFATE HFA 108 (90 BASE) MCG/ACT IN AERS: 2.0000 | INHALATION_SPRAY | Freq: Once | RESPIRATORY_TRACT | Status: DC | PRN

## 2020-10-02 MED ORDER — METHYLPREDNISOLONE SODIUM SUCC 125 MG IJ SOLR: 125.0000 mg | Freq: Once | INTRAMUSCULAR | Status: DC | PRN

## 2020-10-02 MED ORDER — SODIUM CHLORIDE 0.9 % IV SOLN: Freq: Once | INTRAVENOUS | Status: DC | PRN

## 2020-10-02 MED ORDER — DIPHENHYDRAMINE HCL 50 MG/ML IJ SOLN: 50.0000 mg | Freq: Once | INTRAMUSCULAR | Status: DC | PRN

## 2020-10-02 NOTE — Telephone Encounter (Signed)
Referral has been placed to Dr. Laurelyn Sickle office. Nothing further needed at this time.

## 2020-10-02 NOTE — Progress Notes (Addendum)
Diagnosis: Asthma  Provider:  Praveen Mannam, MD  Procedure: Injection  Fasenra (Benralizumab), Dose: 30 mg, Site: subcutaneous  Discharge: Condition: Good, Destination: Home . AVS provided to patient.   Performed by:  Harlow Carrizales, RN        

## 2020-10-03 DIAGNOSIS — R7303 Prediabetes: Secondary | ICD-10-CM | POA: Diagnosis not present

## 2020-10-03 DIAGNOSIS — J4540 Moderate persistent asthma, uncomplicated: Secondary | ICD-10-CM | POA: Diagnosis not present

## 2020-10-03 DIAGNOSIS — Z7902 Long term (current) use of antithrombotics/antiplatelets: Secondary | ICD-10-CM | POA: Diagnosis not present

## 2020-10-03 DIAGNOSIS — E782 Mixed hyperlipidemia: Secondary | ICD-10-CM | POA: Diagnosis not present

## 2020-10-03 DIAGNOSIS — D539 Nutritional anemia, unspecified: Secondary | ICD-10-CM | POA: Diagnosis not present

## 2020-10-03 DIAGNOSIS — Z79899 Other long term (current) drug therapy: Secondary | ICD-10-CM | POA: Diagnosis not present

## 2020-10-03 DIAGNOSIS — I10 Essential (primary) hypertension: Secondary | ICD-10-CM | POA: Diagnosis not present

## 2020-10-03 DIAGNOSIS — R002 Palpitations: Secondary | ICD-10-CM | POA: Diagnosis not present

## 2020-10-03 DIAGNOSIS — E039 Hypothyroidism, unspecified: Secondary | ICD-10-CM | POA: Diagnosis not present

## 2020-10-03 NOTE — Progress Notes (Signed)
Chief Complaint:   OBESITY Tamara Daugherty is here to discuss her progress with her obesity treatment plan along with follow-up of her obesity related diagnoses. Tamara Daugherty is on the Category 2 Plan and states she is following her eating plan approximately 70% of the time. Tamara Daugherty states she is not currently exercising.  Today's visit was #: 18 Starting weight: 319 lbs Starting date: 12/01/2019 Today's weight: 303 lbs Today's date: 10/02/2020 Total lbs lost to date: 16 lbs Total lbs lost since last in-office visit: 4  Interim History: Tamara Daugherty continues to enjoy food and structure of category 2 meal plan.  She is slowly increasing daily walking with the improving spring weather.  Subjective:   1. Vitamin D deficiency Tamara Daugherty's Vitamin D level was 53.1 on 05/15/2020. She is currently taking prescription vitamin D 50,000 IU each week. She denies nausea, vomiting or muscle weakness.  2. Dyspnea on exertion Tamara Daugherty took torsemide 20 mg daily for 4 days (4/4-4/7), then only 1 once since then due to bilateral lower extremity edema with slight increase dyspnea. She is aware to use diuretic only prn- for worsening dyspnea or bilateral lower extremity edema.   3. Pre-diabetes Tamara Daugherty's 05/15/2020 A1c was 5.8, which was improved from last check of 6.0 on 12/01/2019.  Lab Results  Component Value Date   HGBA1C 5.8 (H) 05/15/2020   Lab Results  Component Value Date   INSULIN 20.9 05/15/2020   INSULIN 19.4 12/01/2019    Assessment/Plan:   1. Vitamin D deficiency Low Vitamin D level contributes to fatigue and are associated with obesity, breast, and colon cancer. She agrees to continue to take prescription Vitamin D @50 ,000 IU every week and will follow-up for routine testing of Vitamin D, at least 2-3 times per year to avoid over-replacement. Check labs at next OV.  - Vitamin D, Ergocalciferol, (DRISDOL) 1.25 MG (50000 UNIT) CAPS capsule; Take 1 capsule (50,000 Units total) by mouth every 7 (seven) days.  Dispense: 4  capsule; Refill: 0  2. Dyspnea on exertion Take torsemide as directed.  3. Pre-diabetes Tamara Daugherty will continue to work on weight loss, exercise, and decreasing simple carbohydrates to help decrease the risk of diabetes. Check labs at next OV.  4. Class 3 severe obesity with serious comorbidity and body mass index (BMI) of 50.0 to 59.9 in adult, unspecified obesity type [facility]) Tamara Daugherty is currently in the action stage of change. As such, her goal is to continue with weight loss efforts. She has agreed to the Category 2 Plan.   Exercise goals: Increase daily walking as tolerated.  Behavioral modification strategies: increasing lean protein intake, decreasing simple carbohydrates, meal planning and cooking strategies, keeping healthy foods in the home, better snacking choices and planning for success.  Tamara Daugherty has agreed to follow-up with our clinic in 3 weeks. She was informed of the importance of frequent follow-up visits to maximize her success with intensive lifestyle modifications for her multiple health conditions.   Objective:   Blood pressure 118/75, pulse 71, temperature 97.6 F (36.4 C), height 5\' 5"  (1.651 m), weight (!) 303 lb (137.4 kg), SpO2 96 %. Body mass index is 50.42 kg/m.  General: Cooperative, alert, well developed, in no acute distress. HEENT: Conjunctivae and lids unremarkable. Cardiovascular: Regular rhythm.  Lungs: Normal work of breathing. Neurologic: No focal deficits.   Lab Results  Component Value Date   CREATININE 0.96 09/25/2020   BUN 14 09/25/2020   NA 133 (L) 09/25/2020   K 3.9 09/25/2020   CL 95 (L)  09/25/2020   CO2 35 (H) 09/25/2020   Lab Results  Component Value Date   ALT 13 09/25/2020   AST 15 09/25/2020   ALKPHOS 49 09/25/2020   BILITOT 0.3 09/25/2020   Lab Results  Component Value Date   HGBA1C 5.8 (H) 05/15/2020   HGBA1C 6.0 (H) 12/01/2019   Lab Results  Component Value Date   INSULIN 20.9 05/15/2020   INSULIN 19.4 12/01/2019   Lab  Results  Component Value Date   TSH 1.610 12/01/2019   Lab Results  Component Value Date   CHOL 154 05/15/2020   HDL 66 05/15/2020   LDLCALC 77 05/15/2020   TRIG 49 05/15/2020   Lab Results  Component Value Date   WBC 7.4 09/25/2020   HGB 10.2 (L) 09/25/2020   HCT 31.1 (L) 09/25/2020   MCV 84.8 09/25/2020   PLT 346.0 09/25/2020   No results found for: IRON, TIBC, FERRITIN  Obesity Behavioral Intervention:   Approximately 15 minutes were spent on the discussion below.  ASK: We discussed the diagnosis of obesity with Tamara Daugherty today and Tamara Daugherty agreed to give [name] permission to discuss obesity behavioral modification therapy today.  ASSESS: Tamara Daugherty has the diagnosis of obesity and her BMI today is 50.6. Tamara Daugherty is in the action stage of change.   ADVISE: Tamara Daugherty was educated on the multiple health risks of obesity as well as the benefit of weight loss to improve her health. She was advised of the need for long term treatment and the importance of lifestyle modifications to improve her current health and to decrease her risk of future health problems.  AGREE: Multiple dietary modification options and treatment options were discussed and Tamara Daugherty agreed to follow the recommendations documented in the above note.  ARRANGE: Tamara Daugherty was educated on the importance of frequent visits to treat obesity as outlined per CMS and USPSTF guidelines and agreed to schedule her next follow up appointment today.  Attestation Statements:   Reviewed by clinician on day of visit: allergies, medications, problem list, medical history, surgical history, family history, social history, and previous encounter notes.  Coral Ceo, am acting as Location manager for Tamara Marble, NP.  I have reviewed the above documentation for accuracy and completeness, and I agree with the above. -  Tamara Daugherty d. Tamara Fackler, NP-C

## 2020-10-04 DIAGNOSIS — D539 Nutritional anemia, unspecified: Secondary | ICD-10-CM | POA: Diagnosis not present

## 2020-10-06 ENCOUNTER — Other Ambulatory Visit: Payer: Self-pay | Admitting: Pulmonary Disease

## 2020-10-09 ENCOUNTER — Ambulatory Visit: Payer: PRIVATE HEALTH INSURANCE | Admitting: Pulmonary Disease

## 2020-10-12 DIAGNOSIS — R112 Nausea with vomiting, unspecified: Secondary | ICD-10-CM | POA: Diagnosis not present

## 2020-10-12 DIAGNOSIS — R1013 Epigastric pain: Secondary | ICD-10-CM | POA: Diagnosis not present

## 2020-10-12 DIAGNOSIS — D509 Iron deficiency anemia, unspecified: Secondary | ICD-10-CM | POA: Diagnosis not present

## 2020-10-13 DIAGNOSIS — R0609 Other forms of dyspnea: Secondary | ICD-10-CM | POA: Diagnosis not present

## 2020-10-13 DIAGNOSIS — I4891 Unspecified atrial fibrillation: Secondary | ICD-10-CM | POA: Diagnosis not present

## 2020-10-13 DIAGNOSIS — D649 Anemia, unspecified: Secondary | ICD-10-CM | POA: Diagnosis not present

## 2020-10-18 DIAGNOSIS — Z1231 Encounter for screening mammogram for malignant neoplasm of breast: Secondary | ICD-10-CM | POA: Diagnosis not present

## 2020-10-20 DIAGNOSIS — K449 Diaphragmatic hernia without obstruction or gangrene: Secondary | ICD-10-CM | POA: Diagnosis not present

## 2020-10-20 DIAGNOSIS — R1013 Epigastric pain: Secondary | ICD-10-CM | POA: Diagnosis not present

## 2020-10-20 DIAGNOSIS — K2900 Acute gastritis without bleeding: Secondary | ICD-10-CM | POA: Diagnosis not present

## 2020-10-20 DIAGNOSIS — R1011 Right upper quadrant pain: Secondary | ICD-10-CM | POA: Diagnosis not present

## 2020-10-20 DIAGNOSIS — K2950 Unspecified chronic gastritis without bleeding: Secondary | ICD-10-CM | POA: Diagnosis not present

## 2020-10-20 DIAGNOSIS — D509 Iron deficiency anemia, unspecified: Secondary | ICD-10-CM | POA: Diagnosis not present

## 2020-10-24 ENCOUNTER — Ambulatory Visit (INDEPENDENT_AMBULATORY_CARE_PROVIDER_SITE_OTHER): Payer: Medicare Other | Admitting: Physician Assistant

## 2020-11-01 ENCOUNTER — Other Ambulatory Visit: Payer: Self-pay

## 2020-11-01 ENCOUNTER — Encounter: Payer: Self-pay | Admitting: Internal Medicine

## 2020-11-01 ENCOUNTER — Other Ambulatory Visit: Payer: Self-pay | Admitting: Medical Oncology

## 2020-11-01 ENCOUNTER — Inpatient Hospital Stay: Payer: Medicare Other | Attending: Internal Medicine | Admitting: Internal Medicine

## 2020-11-01 ENCOUNTER — Inpatient Hospital Stay: Payer: Medicare Other

## 2020-11-01 VITALS — BP 113/79 | HR 104 | Temp 96.8°F | Resp 20 | Ht 65.0 in | Wt 309.8 lb

## 2020-11-01 DIAGNOSIS — D649 Anemia, unspecified: Secondary | ICD-10-CM

## 2020-11-01 DIAGNOSIS — Z90710 Acquired absence of both cervix and uterus: Secondary | ICD-10-CM | POA: Insufficient documentation

## 2020-11-01 DIAGNOSIS — D508 Other iron deficiency anemias: Secondary | ICD-10-CM

## 2020-11-01 DIAGNOSIS — E785 Hyperlipidemia, unspecified: Secondary | ICD-10-CM | POA: Diagnosis not present

## 2020-11-01 DIAGNOSIS — Z8673 Personal history of transient ischemic attack (TIA), and cerebral infarction without residual deficits: Secondary | ICD-10-CM | POA: Diagnosis not present

## 2020-11-01 DIAGNOSIS — Z8261 Family history of arthritis: Secondary | ICD-10-CM | POA: Diagnosis not present

## 2020-11-01 DIAGNOSIS — Z79899 Other long term (current) drug therapy: Secondary | ICD-10-CM | POA: Diagnosis not present

## 2020-11-01 DIAGNOSIS — Z801 Family history of malignant neoplasm of trachea, bronchus and lung: Secondary | ICD-10-CM | POA: Insufficient documentation

## 2020-11-01 DIAGNOSIS — E039 Hypothyroidism, unspecified: Secondary | ICD-10-CM | POA: Insufficient documentation

## 2020-11-01 DIAGNOSIS — I10 Essential (primary) hypertension: Secondary | ICD-10-CM | POA: Insufficient documentation

## 2020-11-01 DIAGNOSIS — Z8349 Family history of other endocrine, nutritional and metabolic diseases: Secondary | ICD-10-CM | POA: Insufficient documentation

## 2020-11-01 DIAGNOSIS — Z8249 Family history of ischemic heart disease and other diseases of the circulatory system: Secondary | ICD-10-CM | POA: Insufficient documentation

## 2020-11-01 DIAGNOSIS — D539 Nutritional anemia, unspecified: Secondary | ICD-10-CM

## 2020-11-01 DIAGNOSIS — G4730 Sleep apnea, unspecified: Secondary | ICD-10-CM | POA: Diagnosis not present

## 2020-11-01 DIAGNOSIS — Z7951 Long term (current) use of inhaled steroids: Secondary | ICD-10-CM | POA: Insufficient documentation

## 2020-11-01 DIAGNOSIS — D509 Iron deficiency anemia, unspecified: Secondary | ICD-10-CM | POA: Diagnosis not present

## 2020-11-01 LAB — CMP (CANCER CENTER ONLY)
ALT: 12 U/L (ref 0–44)
AST: 14 U/L — ABNORMAL LOW (ref 15–41)
Albumin: 3.6 g/dL (ref 3.5–5.0)
Alkaline Phosphatase: 45 U/L (ref 38–126)
Anion gap: 6 (ref 5–15)
BUN: 18 mg/dL (ref 8–23)
CO2: 35 mmol/L — ABNORMAL HIGH (ref 22–32)
Calcium: 9.2 mg/dL (ref 8.9–10.3)
Chloride: 96 mmol/L — ABNORMAL LOW (ref 98–111)
Creatinine: 1.09 mg/dL — ABNORMAL HIGH (ref 0.44–1.00)
GFR, Estimated: 56 mL/min — ABNORMAL LOW (ref >60)
Glucose, Bld: 106 mg/dL — ABNORMAL HIGH (ref 70–99)
Potassium: 3.6 mmol/L (ref 3.5–5.1)
Sodium: 137 mmol/L (ref 135–145)
Total Bilirubin: 0.3 mg/dL (ref 0.3–1.2)
Total Protein: 8.2 g/dL — ABNORMAL HIGH (ref 6.5–8.1)

## 2020-11-01 LAB — CBC WITH DIFFERENTIAL (CANCER CENTER ONLY)
Abs Immature Granulocytes: 0.01 10*3/uL (ref 0.00–0.07)
Basophils Absolute: 0 10*3/uL (ref 0.0–0.1)
Basophils Relative: 1 %
Eosinophils Absolute: 0 10*3/uL (ref 0.0–0.5)
Eosinophils Relative: 0 %
HCT: 35 % — ABNORMAL LOW (ref 36.0–46.0)
Hemoglobin: 11 g/dL — ABNORMAL LOW (ref 12.0–15.0)
Immature Granulocytes: 0 %
Lymphocytes Relative: 43 %
Lymphs Abs: 2.3 10*3/uL (ref 0.7–4.0)
MCH: 27.6 pg (ref 26.0–34.0)
MCHC: 31.4 g/dL (ref 30.0–36.0)
MCV: 87.9 fL (ref 80.0–100.0)
Monocytes Absolute: 0.5 10*3/uL (ref 0.1–1.0)
Monocytes Relative: 10 %
Neutro Abs: 2.5 10*3/uL (ref 1.7–7.7)
Neutrophils Relative %: 46 %
Platelet Count: 290 10*3/uL (ref 150–400)
RBC: 3.98 MIL/uL (ref 3.87–5.11)
RDW: 20.9 % — ABNORMAL HIGH (ref 11.5–15.5)
WBC Count: 5.3 10*3/uL (ref 4.0–10.5)
nRBC: 0 % (ref 0.0–0.2)

## 2020-11-01 NOTE — Progress Notes (Signed)
[place] [phone]   [phone]  CONSULT NOTE  REFERRING PHYSICIAN: Dr. Eber Hong  REASON FOR CONSULTATION:  70 years old African-American female with persistent iron deficiency anemia  HPI Tamara Daugherty is a 69 y.o. female with past medical history significant for multiple medical problems including history of anemia and anxiety, Back pain, depression, GERD, heart murmur, hypertension, dyslipidemia, hypothyroidism, TIA, sleep apnea, osteoarthritis.  The patient was seen by her primary care physician for routine evaluation and repeat CBC showed persistent anemia with hemoglobin of 10.3 and hematocrit 32.8%.  She had iron studies that showed low serum iron of 27 and ferritin level 5.8 with transferrin of 372.1.  She has normal B12 and serum folate levels.  The patient was referred to gastroenterology for evaluation because of heme occult positive stool and EGD and colonoscopy were unremarkable except for mild gastritis.  She was started on oral iron tablet with no improvement in her anemia or the iron study.  The patient eats normal diet but with less red protein.  She was referred to me today for evaluation and recommendation regarding her persistent iron deficiency anemia and lack of benefit from the oral iron tablet.  She continues to complain of lack of energy and occasional dizziness and shortness of breath with exertion but no other significant complaints.  She denied having any bleeding, bruises or ecchymosis.  She has occasional diarrhea and nausea.  She has no weight loss or night sweats. Family history, mother had scleroderma, father had lung Daugherty and brother had stroke. The patient is married and has 4 children.  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  She was accompanied today by her husband Tamara Daugherty.  She has no history of smoking and drinks alcohol occasionally and no history of drug abuse.  HPI  Past  Medical History:  Diagnosis Date  . Allergy   . Anemia    takes Iron daily  . Anxiety   . Arthritis   . Asthma    Symbicort daily and Albuterol daily as needed  . B12 deficiency   . Back pain   . Bilateral swelling of feet   . Bruises easily    d/t being on Plavix   . Chest pain   . Complication of anesthesia    long time to wake up - referred to pulmonary after carpel tunnel procedure - for asthma and undiagnosed sleep apnea  . Constipation   . Depression   . GERD (gastroesophageal reflux disease)    takes Protonix daily  . Headache   . Heart murmur    [facility] [place] [facility]  . Heart murmur   . History of blood transfusion    no abnormal reaction noted  . History of colon polyps    beningn  . History of migraine    couple of wks ago was the last one  . Hyperlipidemia    takes Atorvastatin daily  . Hypertension    takes Benicar and Diltiazem daily  . Hypothyroidism    takes Synthroid daily  . Itching    takes Atarax daily as needed  . Joint pain   . Joint pain   . Joint swelling   . Lactose intolerance   . Leg swelling   . Liver cyst   . Mini stroke ([place])   . Multiple allergies    takes Claritin daily as needed as well as using Flonase if needed  .  Muscle spasm    takes Baclofen if needed  . Osteoarthritis   . Peripheral edema    takes Torsemide daily  . Pneumonia    hx of  . Prediabetes   . Primary localized osteoarthritis of left knee 06/21/2014  . Primary localized osteoarthritis of right knee 03/21/2015  . Restrictive lung disease   . Sleep apnea    cpap  . Sleep apnea   . Swallowing difficulty   . TIA (transient ischemic attack)    takes Plavix daily;notices right side is slightly weaker than left  . Vertigo    takes Meclizine daily as needed  . Vitamin D deficiency     Past Surgical History:  Procedure Laterality Date  . ABDOMINAL HYSTERECTOMY    . BACK SURGERY    . BREAST SURGERY     cyst removal  . CARDIAC CATHETERIZATION    .  carpel tunnel Right   . COLONOSCOPY    . ESOPHAGOGASTRODUODENOSCOPY    . KNEE ARTHROSCOPY    . TONSILLECTOMY    . TOTAL KNEE ARTHROPLASTY Left 06/21/2014   Procedure: LEFT TOTAL KNEE ARTHROPLASTY;  Surgeon: Johnny Bridge, MD;  Location: [facility];  Service: Orthopedics;  Laterality: Left;  . TOTAL KNEE ARTHROPLASTY Right 03/21/2015   Procedure: TOTAL KNEE ARTHROPLASTY STEROID INECTION BOTH KNEES;  Surgeon: Marchia Bond, MD;  Location: [facility];  Service: Orthopedics;  Laterality: Right;  . trigger thumb      Family History  Problem Relation Age of Onset  . Scleroderma Mother   . Rheum arthritis Mother   . Hypertension Mother   . Hyperlipidemia Mother   . Heart disease Mother   . Lung Daugherty Father   . Daugherty Father   . Stroke Brother   . Daugherty Maternal Aunt   . Heart disease Paternal Aunt   . Lung Daugherty Paternal Uncle   . Daugherty Cousin   . Lung disease Neg Hx     Social History Social History   Tobacco Use  . Smoking status: Passive Smoke Exposure - Never Smoker  . Smokeless tobacco: Never Used  . Tobacco comment: Father smoked briefly.   Substance Use Topics  . Alcohol use: Yes    Comment: social  . Drug use: No    Allergies  Allergen Reactions  . Bee Venom Anaphylaxis  . Diphenhydramine Hcl Other (See Comments)    Paradoxical reaction/ becomes hyper for days  . Diphenhydramine Hcl     Other reaction(s): Other - See Comments Paradoxical reaction/ becomes hyper for days  . Doxycycline     Feels bad  . Oxycodone-Acetaminophen   . Penicillins Hives    No reaction to Ancef.  OK to give.    Marland Kitchen Percocet [Oxycodone-Acetaminophen] Other (See Comments)    hallucination  . Propoxyphene   . Sulfa Antibiotics Itching    Current Outpatient Medications  Medication Sig Dispense Refill  . albuterol (VENTOLIN HFA) 108 (90 Base) MCG/ACT inhaler Inhale 2 puffs into the lungs every 6 (six) hours as needed for wheezing or shortness of breath. 18 g 3  . atorvastatin (LIPITOR)  10 MG tablet Take 10 mg by mouth daily.    Marland Kitchen azelastine (ASTELIN) 0.1 % nasal spray Place 1 spray into both nostrils 2 (two) times daily. Use in each nostril as directed 30 mL 1  . budesonide (PULMICORT) 0.25 MG/2ML nebulizer solution Inhale into the lungs.    . budesonide (PULMICORT) 0.5 MG/2ML nebulizer solution USE 1 VIAL  IN  NEBULIZER TWICE  DAILY - at 10 AM and 5 PM. Rinse mouth after treatment 120 mL 6  . clopidogrel (PLAVIX) 75 MG tablet Take 1 tablet by mouth daily.    Marland Kitchen diltiazem (TIAZAC) 240 MG 24 hr capsule Take by mouth.    . EPINEPHrine 0.3 mg/0.3 mL IJ SOAJ injection Inject 0.3 mg into the muscle once.    . fluticasone (FLONASE) 50 MCG/ACT nasal spray Place 1 spray into both nostrils 2 (two) times daily as needed for allergies or rhinitis.    Marland Kitchen guaiFENesin (MUCINEX) 600 MG 12 hr tablet Take 600 mg by mouth 2 (two) times daily as needed for to loosen phlegm.    Marland Kitchen ipratropium-albuterol (DUONEB) 0.5-2.5 (3) MG/3ML SOLN Inhale into the lungs.    . Iron-Vitamin C (VITRON-C) 65-125 MG TABS Take by mouth.    . levothyroxine (SYNTHROID, LEVOTHROID) 50 MCG tablet Take 50 mcg by mouth daily before breakfast.    . loratadine (CLARITIN) 10 MG tablet Take 10 mg by mouth daily as needed for allergies.    . montelukast (SINGULAIR) 10 MG tablet Take 1 tablet (10 mg total) by mouth at bedtime. 90 tablet 3  . Multiple Vitamins-Minerals (MULTIVITAMIN WITH MINERALS) tablet Take 1 tablet by mouth daily.    Marland Kitchen olmesartan-hydrochlorothiazide (BENICAR HCT) 40-25 MG per tablet Take 1 tablet by mouth daily.    . Omega-3 Fatty Acids (FISH OIL PO) Take 1 capsule by mouth 2 (two) times daily.    . pantoprazole (PROTONIX) 40 MG tablet Take 40 mg by mouth daily.    Marland Kitchen PERFOROMIST 20 MCG/2ML nebulizer solution USE 1 VIAL  IN  NEBULIZER TWICE  DAILY - morning and evening 120 mL 6  . potassium chloride (MICRO-K) 10 MEQ CR capsule Take 30 mEq by mouth daily.     Marland Kitchen pyridOXINE (B-6) 50 MG tablet Take 50 mg by mouth  daily.    Marland Kitchen Spacer/Aero-Holding Chambers (AEROCHAMBER MV) inhaler Use as instructed 1 each 0  . torsemide (DEMADEX) 20 MG tablet Take 1 tablet (20 mg total) by mouth as needed. 30 tablet 1  . TURMERIC PO Take 1 capsule by mouth daily.    Marland Kitchen UNABLE TO FIND Nebulizer machine  Diagnosis - moderate persistent asthma    . Vitamin D, Ergocalciferol, (DRISDOL) 1.25 MG (50000 UNIT) CAPS capsule Take 1 capsule (50,000 Units total) by mouth every 7 (seven) days. 4 capsule 0   No current facility-administered medications for this visit.    Review of Systems  Constitutional: positive for fatigue Eyes: negative Ears, nose, mouth, throat, and face: negative Respiratory: positive for dyspnea on exertion Cardiovascular: negative Gastrointestinal: positive for diarrhea and nausea Genitourinary:negative Integument/breast: negative Hematologic/lymphatic: negative Musculoskeletal:negative Neurological: negative Behavioral/Psych: negative Endocrine: negative Allergic/Immunologic: negative  Physical Exam  NKN:LZJQB, healthy, no distress, well nourished, well developed and obese SKIN: skin color, texture, turgor are normal, no rashes or significant lesions HEAD: Normocephalic, No masses, lesions, tenderness or abnormalities EYES: normal, PERRLA, Conjunctiva are pink and non-injected EARS: External ears normal, Canals clear OROPHARYNX:no exudate, no erythema and lips, buccal mucosa, and tongue normal  NECK: supple, no adenopathy, no JVD LYMPH:  no palpable lymphadenopathy, no hepatosplenomegaly BREAST:not examined LUNGS: clear to auscultation , and palpation HEART: regular rate & rhythm, no murmurs and no gallops ABDOMEN:abdomen soft, non-tender, obese, normal bowel sounds and no masses or organomegaly BACK: No CVA tenderness, Range of motion is normal EXTREMITIES:no joint deformities, effusion, or inflammation, no edema  NEURO: alert & oriented x 3 with fluent speech, no focal  motor/sensory  deficits  PERFORMANCE STATUS: ECOG 1  LABORATORY DATA: Lab Results  Component Value Date   WBC 5.3 11/01/2020   HGB 11.0 (L) 11/01/2020   HCT 35.0 (L) 11/01/2020   MCV 87.9 11/01/2020   PLT 290 11/01/2020      Chemistry      Component Value Date/Time   NA 137 11/01/2020 1115   NA 135 05/15/2020 1129   K 3.6 11/01/2020 1115   CL 96 (L) 11/01/2020 1115   CO2 35 (H) 11/01/2020 1115   BUN 18 11/01/2020 1115   BUN 13 05/15/2020 1129   CREATININE 1.09 (H) 11/01/2020 1115      Component Value Date/Time   CALCIUM 9.2 11/01/2020 1115   ALKPHOS 45 11/01/2020 1115   AST 14 (L) 11/01/2020 1115   ALT 12 11/01/2020 1115   BILITOT 0.3 11/01/2020 1115       RADIOGRAPHIC STUDIES: No results found.  ASSESSMENT: This is a very pleasant 70 years old African-American female with persistent iron deficiency anemia with no clear etiology except for possibility of dietary insufficiency and the patient did not have any improvement in her anemia with the oral iron tablet that has been started recently.   PLAN: I had a lengthy discussion with the patient and her husband about her current condition and treatment options. Repeat CBC today showed persistent anemia with hemoglobin of 11.0 and hematocrit 35.0. With the lack of benefit from the iron tablets, I recommended for the patient to consider IV iron infusion with Venofer 300 Mg IV weekly for 3 weeks. She is expected to start the first dose of this treatment later this week. I will see the patient back for follow-up visit in around 2 months for evaluation with repeat CBC, iron study and ferritin. She was also advised to increase her iron rich diet including red meat and liver. The patient was advised to call immediately if she has any other concerning symptoms in the interval. The patient voices understanding of current disease status and treatment options and is in agreement with the current care plan.  All questions were answered. The  patient knows to call the clinic with any problems, questions or concerns. We can certainly see the patient much sooner if necessary.  Thank you so much for allowing me to participate in the care of Tamara Daugherty. I will continue to follow up the patient with you and assist in her care.  The total time spent in the appointment was 60 minutes.  Disclaimer: This note was dictated with voice recognition software. Similar sounding words can inadvertently be transcribed and may not be corrected upon review.   Eilleen Kempf Nov 01, 2020, 12:08 PM

## 2020-11-02 ENCOUNTER — Encounter: Payer: Self-pay | Admitting: Medical Oncology

## 2020-11-02 ENCOUNTER — Encounter: Payer: Self-pay | Admitting: Internal Medicine

## 2020-11-03 ENCOUNTER — Encounter: Payer: Self-pay | Admitting: Internal Medicine

## 2020-11-03 ENCOUNTER — Ambulatory Visit: Payer: PRIVATE HEALTH INSURANCE | Admitting: Cardiology

## 2020-11-06 ENCOUNTER — Ambulatory Visit (INDEPENDENT_AMBULATORY_CARE_PROVIDER_SITE_OTHER): Payer: Medicare Other | Admitting: Physician Assistant

## 2020-11-06 NOTE — Telephone Encounter (Signed)
I spoke with pt regarding her iron infusion scheduling/availability. Pt would like a Monday or Tuesday at or around 11am and on the same day of the week for her 3 sessions.  I have sent a schedule message with this detail and asked that they call the pt to confirm the date/time.

## 2020-11-08 ENCOUNTER — Telehealth: Payer: Self-pay | Admitting: Internal Medicine

## 2020-11-08 ENCOUNTER — Ambulatory Visit (INDEPENDENT_AMBULATORY_CARE_PROVIDER_SITE_OTHER): Payer: Medicare Other | Admitting: Physician Assistant

## 2020-11-08 NOTE — Telephone Encounter (Signed)
Scheduled appts per 5/16 sch msg. Pt aware.  

## 2020-11-09 MED ORDER — BUDESONIDE-FORMOTEROL FUMARATE 160-4.5 MCG/ACT IN AERO: 2.0000 | INHALATION_SPRAY | Freq: Two times a day (BID) | RESPIRATORY_TRACT | 12 refills | Status: DC

## 2020-11-09 NOTE — Telephone Encounter (Signed)
Prescription sent in to preferred pharmacy per Dr. Juanetta Gosling ok. Nothing further needed at this time.

## 2020-11-09 NOTE — Telephone Encounter (Signed)
VS please advise. Thanks  Can you write me a prescription for Symbicort 160 HFA so I can have it on hand when I don't have time to mix up the inhaler meds that I use in place of Symbicort?    Please send the [facility] to [facility] [place] ([place] location)....thank you.

## 2020-11-10 ENCOUNTER — Ambulatory Visit (HOSPITAL_COMMUNITY): Payer: PRIVATE HEALTH INSURANCE

## 2020-11-10 DIAGNOSIS — D649 Anemia, unspecified: Secondary | ICD-10-CM | POA: Diagnosis not present

## 2020-11-10 DIAGNOSIS — I4891 Unspecified atrial fibrillation: Secondary | ICD-10-CM | POA: Diagnosis not present

## 2020-11-10 DIAGNOSIS — R0609 Other forms of dyspnea: Secondary | ICD-10-CM | POA: Diagnosis not present

## 2020-11-13 ENCOUNTER — Encounter: Payer: Self-pay | Admitting: Pulmonary Disease

## 2020-11-13 ENCOUNTER — Other Ambulatory Visit: Payer: Self-pay

## 2020-11-13 ENCOUNTER — Ambulatory Visit (INDEPENDENT_AMBULATORY_CARE_PROVIDER_SITE_OTHER): Payer: Medicare Other | Admitting: Pulmonary Disease

## 2020-11-13 VITALS — BP 114/62 | HR 60 | Temp 97.6°F | Ht 65.0 in | Wt 309.0 lb

## 2020-11-13 DIAGNOSIS — Z6841 Body Mass Index (BMI) 40.0 and over, adult: Secondary | ICD-10-CM | POA: Diagnosis not present

## 2020-11-13 NOTE — Patient Instructions (Signed)
Will arrange for referral to [facility] [place] [facility] to assess for weight loss surgery  Follow up in 6 months

## 2020-11-13 NOTE — Progress Notes (Signed)
[facility]  Chief Complaint  Patient presents with  . Follow-up    Wears CPAP-sob with exertion x 3 mths., cough-clear    Constitutional:  BP 114/62 (BP Location: Right Arm, Cuff Size: Normal)   Pulse 60   Temp 97.6 F (36.4 C) (Temporal)   Ht 5\' 5"  (1.651 m)   Wt (!) 309 lb (140.2 kg)   SpO2 97%   BMI 51.42 kg/m   Past Medical History:  Anemia, Anxiety, OA, GERD, HA, Colon polyps, HLD, HTN, Hypothyroidism, PNA, Vertigo, TIA  Past Surgical History:  She  has a past surgical history that includes Tonsillectomy; Abdominal hysterectomy; Back surgery; Knee arthroscopy; Breast surgery; Cardiac catheterization; trigger thumb; carpel tunnel (Right); Total knee arthroplasty (Left, 06/21/2014); Colonoscopy; Esophagogastroduodenoscopy; and Total knee arthroplasty (Right, 03/21/2015).  Brief Summary:  Tamara Daugherty is a 70 y.o. female with eosinophilic asthma and obstructive sleep apnea.      Subjective:   She was seen by Dr. Humphrey Rolls with cardiology in [place].  She was found to have intermittent a fib.  She hasn't started anticoagulation yet.  She was found to have iron deficiency and was referred to Dr. Julien Nordmann.  She uses CPAP nightly w/o issue.  She has intermittent sinus congestion, post nasal drip and cough with clear sputum.  Feels that fasenra has helped.  She has been working with medical weight loss team.  Has lost about 20 lbs in the past year, but was hoping for more progress and feels like she is stuck.  Physical Exam:   Appearance - well kempt   ENMT - no sinus tenderness, no oral exudate, no LAN, Mallampati 4 airway, no stridor  Respiratory - equal breath sounds bilaterally, no wheezing or rales  CV - s1s2 regular rate and rhythm, no murmurs  Ext - no clubbing, no edema  Skin - no rashes  Psych - normal mood and affect    Pulmonary testing:   RAST 08/30/16 >> dust mights, IgE 121  PFT 09/27/16 >> FEV1 1.56  (71%), FEV1% 81, TLC 3.46 (64%), DLCO 69%  PFT 01/09/17 >> FEV1 1.67 (76%), FEV1% 75, DLCO 65%  PFT 04/23/18 >> FEV1 1.61 (76%), FEV1% 77, TLC 4.63 (87%), DLCO 69%  Chest Imaging:   HRCT chest 01/16/17 >> mild tracheomalacia, mild CM, PA 4.1 cm  Sleep Tests:   PSG 12/16/13 ([facility]) >> moderate OSA  CPAP 09/26/19 to 10/25/19 >> used on 30 of 30 nights with average 7 hrs 27 min.  Average AHI 2.3 with CPAP 8 cm H2O  Cardiac Tests:   Echo 01/16/17 >> EF 55 to 60%, mild MR  Social History:  She  reports that she is a non-smoker but has been exposed to tobacco smoke. She has never used smokeless tobacco. She reports current alcohol use. She reports that she does not use drugs.  Family History:  Her family history includes Cancer in her cousin, father, and maternal aunt; Heart disease in her mother and paternal aunt; Hyperlipidemia in her mother; Hypertension in her mother; Lung cancer in her father and paternal uncle; Rheum arthritis in her mother; Scleroderma in her mother; Stroke in her brother.     Assessment/Plan:   Eosinophilic asthma. - started on fasenra in May 2020; continue this - nebulizer therapy is more cost effective for her: continue perforomist, pulmicort and prn duoneb - continue singulair - she has symbicort inhaler that she uses when she travels so she doesn't have to bring her nebulizer  Allergic rhinitis. - continue fasenra, singulair - prn fluticasone, loratadine - azelastine had a bad taste and made her feels nauseous  Obstructive sleep apnea. - she is compliant with CPAP and reports benefit - she uses [facility] as her DME - continue CPAP 8 cm H2O  Paroxysmal atrial fibrillation. - followed by Dr. Humphrey Rolls with cardiology in [place], [place]  Iron deficiency anemia. - followed by Dr. Julien Nordmann with [facility]  Obesity. - she has been consist with diet, but her progress has hit a plateau - will arrange for referral to [facility] [place] [facility]  to assess surgical options to assist with weight loss   Time Spent Involved in Patient Care on Day of Examination:  36 minutes  Follow up:  Patient Instructions  Will arrange for referral to [facility] [place] [facility] to assess for weight loss surgery  Follow up in 6 months   Medication List:   Allergies as of 11/13/2020      Reactions   Bee Venom Anaphylaxis   Diphenhydramine Hcl Other (See Comments)   Paradoxical reaction/ becomes hyper for days   Diphenhydramine Hcl    Other reaction(s): Other - See Comments Paradoxical reaction/ becomes hyper for days   Doxycycline    Feels bad   Oxycodone-acetaminophen    Penicillins Hives   No reaction to Ancef.  OK to give.     Percocet [oxycodone-acetaminophen] Other (See Comments)   hallucination   Propoxyphene    Sulfa Antibiotics Itching      Medication List       Accurate as of Nov 13, 2020  1:16 PM. If you have any questions, ask your nurse or doctor.        STOP taking these medications   azelastine 0.1 % nasal spray Commonly known as: ASTELIN Stopped by: Chesley Mires, MD     TAKE these medications   AeroChamber MV inhaler Use as instructed   albuterol 108 (90 Base) MCG/ACT inhaler Commonly known as: VENTOLIN HFA Inhale 2 puffs into the lungs every 6 (six) hours as needed for wheezing or shortness of breath.   atorvastatin 10 MG tablet Commonly known as: LIPITOR Take 10 mg by mouth daily.   budesonide 0.5 MG/2ML nebulizer solution Commonly known as: PULMICORT USE 1 VIAL  IN  NEBULIZER TWICE  DAILY - at 10 AM and 5 PM. Rinse mouth after treatment   budesonide-formoterol 160-4.5 MCG/ACT inhaler Commonly known as: Symbicort Inhale 2 puffs into the lungs in the morning and at bedtime.   clopidogrel 75 MG tablet Commonly known as: PLAVIX Take 1 tablet by mouth daily.   diltiazem 240 MG 24 hr capsule Commonly known as: CARDIZEM CD Take 240 mg by mouth daily.   EPINEPHrine 0.3 mg/0.3 mL Soaj  injection Commonly known as: EPI-PEN Inject 0.3 mg into the muscle once.   FISH OIL PO Take 1 capsule by mouth 2 (two) times daily.   fluticasone 50 MCG/ACT nasal spray Commonly known as: FLONASE Place 1 spray into both nostrils 2 (two) times daily as needed for allergies or rhinitis.   guaiFENesin 600 MG 12 hr tablet Commonly known as: MUCINEX Take 600 mg by mouth 2 (two) times daily as needed for to loosen phlegm.   ipratropium-albuterol 0.5-2.5 (3) MG/3ML Soln Commonly known as: DUONEB Inhale into the lungs.   levothyroxine 50 MCG tablet Commonly known as: SYNTHROID Take 50 mcg by mouth daily before breakfast.   loratadine 10 MG tablet Commonly known as: CLARITIN Take 10 mg by mouth daily as  needed for allergies.   metoprolol succinate 50 MG 24 hr tablet Commonly known as: TOPROL-XL Take 50 mg by mouth daily. Take with or immediately following a meal.   montelukast 10 MG tablet Commonly known as: SINGULAIR Take 1 tablet (10 mg total) by mouth at bedtime.   multivitamin with minerals tablet Take 1 tablet by mouth daily.   olmesartan-hydrochlorothiazide 40-25 MG tablet Commonly known as: BENICAR HCT Take 1 tablet by mouth daily.   pantoprazole 40 MG tablet Commonly known as: PROTONIX Take 40 mg by mouth daily.   Perforomist 20 MCG/2ML nebulizer solution Generic drug: formoterol USE 1 VIAL  IN  NEBULIZER TWICE  DAILY - morning and evening   potassium chloride 10 MEQ CR capsule Commonly known as: MICRO-K Take 30 mEq by mouth daily.   pyridOXINE 50 MG tablet Commonly known as: B-6 Take 50 mg by mouth daily.   torsemide 20 MG tablet Commonly known as: DEMADEX Take 1 tablet (20 mg total) by mouth as needed.   TURMERIC PO Take 1 capsule by mouth daily.   Vitamin D (Ergocalciferol) 1.25 MG (50000 UNIT) Caps capsule Commonly known as: DRISDOL Take 1 capsule (50,000 Units total) by mouth every 7 (seven) days.   Vitron-C 65-125 MG Tabs Generic drug:  Iron-Vitamin C Take by mouth.       Signature:  Chesley Mires, MD [place] Pager - [phone] 11/13/2020, 1:16 PM

## 2020-11-14 ENCOUNTER — Inpatient Hospital Stay: Payer: Medicare Other

## 2020-11-14 ENCOUNTER — Ambulatory Visit: Payer: PRIVATE HEALTH INSURANCE | Admitting: Cardiology

## 2020-11-14 VITALS — BP 132/69 | HR 55 | Temp 98.2°F | Resp 16 | Wt 309.0 lb

## 2020-11-14 DIAGNOSIS — E039 Hypothyroidism, unspecified: Secondary | ICD-10-CM | POA: Diagnosis not present

## 2020-11-14 DIAGNOSIS — D508 Other iron deficiency anemias: Secondary | ICD-10-CM

## 2020-11-14 DIAGNOSIS — Z8673 Personal history of transient ischemic attack (TIA), and cerebral infarction without residual deficits: Secondary | ICD-10-CM | POA: Diagnosis not present

## 2020-11-14 DIAGNOSIS — D509 Iron deficiency anemia, unspecified: Secondary | ICD-10-CM | POA: Diagnosis not present

## 2020-11-14 DIAGNOSIS — G4730 Sleep apnea, unspecified: Secondary | ICD-10-CM | POA: Diagnosis not present

## 2020-11-14 DIAGNOSIS — I10 Essential (primary) hypertension: Secondary | ICD-10-CM | POA: Diagnosis not present

## 2020-11-14 DIAGNOSIS — E785 Hyperlipidemia, unspecified: Secondary | ICD-10-CM | POA: Diagnosis not present

## 2020-11-14 MED ORDER — SODIUM CHLORIDE 0.9 % IV SOLN
Freq: Once | INTRAVENOUS | Status: AC
  Filled 2020-11-14: qty 250

## 2020-11-14 MED ORDER — SODIUM CHLORIDE 0.9 % IV SOLN
300.0000 mg | Freq: Once | INTRAVENOUS | Status: AC
  Administered 2020-11-14: 300 mg via INTRAVENOUS
  Filled 2020-11-14: qty 300

## 2020-11-14 NOTE — Patient Instructions (Signed)

## 2020-11-17 ENCOUNTER — Encounter: Payer: Self-pay | Admitting: Internal Medicine

## 2020-11-17 ENCOUNTER — Ambulatory Visit (HOSPITAL_COMMUNITY): Payer: PRIVATE HEALTH INSURANCE

## 2020-11-21 ENCOUNTER — Other Ambulatory Visit: Payer: Self-pay

## 2020-11-21 ENCOUNTER — Inpatient Hospital Stay: Payer: Medicare Other

## 2020-11-21 VITALS — BP 101/88 | HR 67 | Temp 97.9°F | Resp 18

## 2020-11-21 DIAGNOSIS — I10 Essential (primary) hypertension: Secondary | ICD-10-CM | POA: Diagnosis not present

## 2020-11-21 DIAGNOSIS — E785 Hyperlipidemia, unspecified: Secondary | ICD-10-CM | POA: Diagnosis not present

## 2020-11-21 DIAGNOSIS — Z8673 Personal history of transient ischemic attack (TIA), and cerebral infarction without residual deficits: Secondary | ICD-10-CM | POA: Diagnosis not present

## 2020-11-21 DIAGNOSIS — D508 Other iron deficiency anemias: Secondary | ICD-10-CM

## 2020-11-21 DIAGNOSIS — D509 Iron deficiency anemia, unspecified: Secondary | ICD-10-CM | POA: Diagnosis not present

## 2020-11-21 DIAGNOSIS — G4730 Sleep apnea, unspecified: Secondary | ICD-10-CM | POA: Diagnosis not present

## 2020-11-21 DIAGNOSIS — E039 Hypothyroidism, unspecified: Secondary | ICD-10-CM | POA: Diagnosis not present

## 2020-11-21 MED ORDER — SODIUM CHLORIDE 0.9 % IV SOLN
Freq: Once | INTRAVENOUS | Status: AC
  Filled 2020-11-21: qty 250

## 2020-11-21 MED ORDER — SODIUM CHLORIDE 0.9 % IV SOLN
300.0000 mg | Freq: Once | INTRAVENOUS | Status: AC
  Administered 2020-11-21: 300 mg via INTRAVENOUS
  Filled 2020-11-21: qty 300

## 2020-11-21 NOTE — Patient Instructions (Signed)

## 2020-11-22 ENCOUNTER — Other Ambulatory Visit: Payer: Self-pay | Admitting: Pulmonary Disease

## 2020-11-22 NOTE — Telephone Encounter (Signed)
Dr. Halford Chessman, please advise if you are okay with [name] refilling med or if this needs to be deferred to PCP

## 2020-11-23 NOTE — Telephone Encounter (Signed)
Needs to be refilled by PCP or cardiology.

## 2020-11-24 ENCOUNTER — Ambulatory Visit (HOSPITAL_COMMUNITY): Payer: PRIVATE HEALTH INSURANCE

## 2020-11-27 ENCOUNTER — Ambulatory Visit: Payer: PRIVATE HEALTH INSURANCE

## 2020-11-28 ENCOUNTER — Other Ambulatory Visit: Payer: Self-pay

## 2020-11-28 ENCOUNTER — Ambulatory Visit (INDEPENDENT_AMBULATORY_CARE_PROVIDER_SITE_OTHER): Payer: Medicare Other

## 2020-11-28 ENCOUNTER — Inpatient Hospital Stay: Payer: Medicare Other | Attending: Internal Medicine

## 2020-11-28 VITALS — BP 115/53 | HR 55 | Temp 97.7°F | Resp 18

## 2020-11-28 VITALS — BP 101/61 | HR 55 | Temp 98.0°F | Resp 20

## 2020-11-28 DIAGNOSIS — D508 Other iron deficiency anemias: Secondary | ICD-10-CM

## 2020-11-28 DIAGNOSIS — J4550 Severe persistent asthma, uncomplicated: Secondary | ICD-10-CM | POA: Diagnosis not present

## 2020-11-28 DIAGNOSIS — D509 Iron deficiency anemia, unspecified: Secondary | ICD-10-CM | POA: Diagnosis not present

## 2020-11-28 MED ORDER — EPINEPHRINE 0.3 MG/0.3ML IJ SOAJ: 0.3000 mg | Freq: Once | INTRAMUSCULAR | Status: DC | PRN

## 2020-11-28 MED ORDER — SODIUM CHLORIDE 0.9 % IV SOLN: Freq: Once | INTRAVENOUS | Status: DC | PRN

## 2020-11-28 MED ORDER — SODIUM CHLORIDE 0.9 % IV SOLN
300.0000 mg | Freq: Once | INTRAVENOUS | Status: AC
  Administered 2020-11-28: 300 mg via INTRAVENOUS
  Filled 2020-11-28: qty 300

## 2020-11-28 MED ORDER — METHYLPREDNISOLONE SODIUM SUCC 125 MG IJ SOLR: 125.0000 mg | Freq: Once | INTRAMUSCULAR | Status: DC | PRN

## 2020-11-28 MED ORDER — DIPHENHYDRAMINE HCL 50 MG/ML IJ SOLN: 50.0000 mg | Freq: Once | INTRAMUSCULAR | Status: DC | PRN

## 2020-11-28 MED ORDER — SODIUM CHLORIDE 0.9 % IV SOLN
Freq: Once | INTRAVENOUS | Status: AC
  Filled 2020-11-28: qty 250

## 2020-11-28 MED ORDER — BENRALIZUMAB 30 MG/ML ~~LOC~~ SOSY
30.0000 mg | PREFILLED_SYRINGE | Freq: Once | SUBCUTANEOUS | Status: AC
  Administered 2020-11-28: 30 mg via SUBCUTANEOUS

## 2020-11-28 MED ORDER — FAMOTIDINE IN NACL 20-0.9 MG/50ML-% IV SOLN: 20.0000 mg | Freq: Once | INTRAVENOUS | Status: DC | PRN

## 2020-11-28 MED ORDER — ALBUTEROL SULFATE HFA 108 (90 BASE) MCG/ACT IN AERS: 2.0000 | INHALATION_SPRAY | Freq: Once | RESPIRATORY_TRACT | Status: DC | PRN

## 2020-11-28 NOTE — Progress Notes (Signed)
Diagnosis: Asthma  Provider:  Marshell Garfinkel, MD  Procedure: Injection  Fasenra (Benralizumab), Dose: 30 mg, Site: subcutaneous, left arm  Discharge: Condition: Good, Destination: Home . AVS provided to patient.   Performed by:  Koren Shiver, RN

## 2020-11-28 NOTE — Patient Instructions (Signed)

## 2020-12-19 DIAGNOSIS — I499 Cardiac arrhythmia, unspecified: Secondary | ICD-10-CM | POA: Diagnosis not present

## 2020-12-19 DIAGNOSIS — Z01818 Encounter for other preprocedural examination: Secondary | ICD-10-CM | POA: Diagnosis not present

## 2020-12-19 DIAGNOSIS — I4891 Unspecified atrial fibrillation: Secondary | ICD-10-CM | POA: Diagnosis not present

## 2020-12-19 DIAGNOSIS — I422 Other hypertrophic cardiomyopathy: Secondary | ICD-10-CM | POA: Diagnosis not present

## 2020-12-19 DIAGNOSIS — I081 Rheumatic disorders of both mitral and tricuspid valves: Secondary | ICD-10-CM | POA: Diagnosis not present

## 2020-12-21 DIAGNOSIS — I4891 Unspecified atrial fibrillation: Secondary | ICD-10-CM | POA: Diagnosis not present

## 2020-12-21 DIAGNOSIS — I340 Nonrheumatic mitral (valve) insufficiency: Secondary | ICD-10-CM | POA: Diagnosis not present

## 2020-12-21 DIAGNOSIS — D649 Anemia, unspecified: Secondary | ICD-10-CM | POA: Diagnosis not present

## 2020-12-21 DIAGNOSIS — R9439 Abnormal result of other cardiovascular function study: Secondary | ICD-10-CM | POA: Diagnosis not present

## 2020-12-21 DIAGNOSIS — R0609 Other forms of dyspnea: Secondary | ICD-10-CM | POA: Diagnosis not present

## 2020-12-21 DIAGNOSIS — I440 Atrioventricular block, first degree: Secondary | ICD-10-CM | POA: Diagnosis not present

## 2020-12-21 DIAGNOSIS — I499 Cardiac arrhythmia, unspecified: Secondary | ICD-10-CM | POA: Diagnosis not present

## 2021-01-03 ENCOUNTER — Inpatient Hospital Stay: Payer: Medicare Other

## 2021-01-03 ENCOUNTER — Inpatient Hospital Stay: Payer: Medicare Other | Attending: Internal Medicine | Admitting: Internal Medicine

## 2021-01-03 ENCOUNTER — Other Ambulatory Visit: Payer: Self-pay

## 2021-01-03 ENCOUNTER — Encounter: Payer: Self-pay | Admitting: Internal Medicine

## 2021-01-03 VITALS — BP 140/81 | HR 53 | Temp 97.1°F | Resp 20 | Ht 65.0 in | Wt 303.7 lb

## 2021-01-03 DIAGNOSIS — D509 Iron deficiency anemia, unspecified: Secondary | ICD-10-CM | POA: Diagnosis not present

## 2021-01-03 DIAGNOSIS — E785 Hyperlipidemia, unspecified: Secondary | ICD-10-CM | POA: Diagnosis not present

## 2021-01-03 DIAGNOSIS — E039 Hypothyroidism, unspecified: Secondary | ICD-10-CM | POA: Insufficient documentation

## 2021-01-03 DIAGNOSIS — D500 Iron deficiency anemia secondary to blood loss (chronic): Secondary | ICD-10-CM | POA: Diagnosis not present

## 2021-01-03 DIAGNOSIS — Z79899 Other long term (current) drug therapy: Secondary | ICD-10-CM | POA: Insufficient documentation

## 2021-01-03 DIAGNOSIS — G4730 Sleep apnea, unspecified: Secondary | ICD-10-CM | POA: Diagnosis not present

## 2021-01-03 DIAGNOSIS — J45909 Unspecified asthma, uncomplicated: Secondary | ICD-10-CM | POA: Diagnosis not present

## 2021-01-03 DIAGNOSIS — K909 Intestinal malabsorption, unspecified: Secondary | ICD-10-CM | POA: Insufficient documentation

## 2021-01-03 DIAGNOSIS — Z8673 Personal history of transient ischemic attack (TIA), and cerebral infarction without residual deficits: Secondary | ICD-10-CM | POA: Diagnosis not present

## 2021-01-03 DIAGNOSIS — I10 Essential (primary) hypertension: Secondary | ICD-10-CM | POA: Diagnosis not present

## 2021-01-03 DIAGNOSIS — Z7951 Long term (current) use of inhaled steroids: Secondary | ICD-10-CM | POA: Diagnosis not present

## 2021-01-03 DIAGNOSIS — D508 Other iron deficiency anemias: Secondary | ICD-10-CM

## 2021-01-03 LAB — CBC WITH DIFFERENTIAL (CANCER CENTER ONLY)
Abs Immature Granulocytes: 0 10*3/uL (ref 0.00–0.07)
Basophils Absolute: 0 10*3/uL (ref 0.0–0.1)
Basophils Relative: 1 %
Eosinophils Absolute: 0 10*3/uL (ref 0.0–0.5)
Eosinophils Relative: 0 %
HCT: 36.2 % (ref 36.0–46.0)
Hemoglobin: 12 g/dL (ref 12.0–15.0)
Immature Granulocytes: 0 %
Lymphocytes Relative: 41 %
Lymphs Abs: 2.4 10*3/uL (ref 0.7–4.0)
MCH: 30 pg (ref 26.0–34.0)
MCHC: 33.1 g/dL (ref 30.0–36.0)
MCV: 90.5 fL (ref 80.0–100.0)
Monocytes Absolute: 0.6 10*3/uL (ref 0.1–1.0)
Monocytes Relative: 9 %
Neutro Abs: 2.9 10*3/uL (ref 1.7–7.7)
Neutrophils Relative %: 49 %
Platelet Count: 285 10*3/uL (ref 150–400)
RBC: 4 MIL/uL (ref 3.87–5.11)
RDW: 19.9 % — ABNORMAL HIGH (ref 11.5–15.5)
WBC Count: 5.9 10*3/uL (ref 4.0–10.5)
nRBC: 0 % (ref 0.0–0.2)

## 2021-01-03 LAB — IRON AND TIBC
Iron: 62 ug/dL (ref 41–142)
Saturation Ratios: 19 % — ABNORMAL LOW (ref 21–57)
TIBC: 319 ug/dL (ref 236–444)
UIBC: 257 ug/dL (ref 120–384)

## 2021-01-03 LAB — FERRITIN: Ferritin: 75 ng/mL (ref 11–307)

## 2021-01-03 NOTE — Progress Notes (Signed)
[place] [phone]   Fax:([phone]  OFFICE PROGRESS NOTE  Eber Hong, [place] [address]  DIAGNOSIS: Persistent iron deficiency anemia with no clear etiology except for possibility of dietary insufficiency and the patient did not have any improvement in her anemia with the oral iron tablet  PRIOR THERAPY: Iron infusion with Venofer 300 mg IV weekly for 3 weeks.  CURRENT THERAPY: Vitron-C 1 tablet p.o. daily.  INTERVAL HISTORY: Tamara Daugherty 70 y.o. female returns to the clinic today for follow-up visit.  The patient is feeling fine today with no concerning complaints except for mild fatigue and intermittent cramps in her legs.  She is currently on torsemide.  She is also on potassium supplements by her cardiologist.  The patient denied having any current chest pain but has shortness of breath with exertion with no cough or hemoptysis.  She denied having any fever or chills.  She has no nausea, vomiting, diarrhea or constipation.  She denied having any headache or visual changes.  She has no weight loss or night sweats.  She is here today for evaluation with repeat CBC, iron study and ferritin.  MEDICAL HISTORY: Past Medical History:  Diagnosis Date   Allergy    Anemia    takes Iron daily   Anxiety    Arthritis    Asthma    Symbicort daily and Albuterol daily as needed   B12 deficiency    Back pain    Bilateral swelling of feet    Bruises easily    d/t being on Plavix    Chest pain    Complication of anesthesia    long time to wake up - referred to pulmonary after carpel tunnel procedure - for asthma and undiagnosed sleep apnea   Constipation    Depression    GERD (gastroesophageal reflux disease)    takes Protonix daily   Headache    Heart murmur    [facility] - [facility] [facility]   Heart murmur    History of blood transfusion    no abnormal reaction noted   History of colon polyps    beningn   History of  migraine    couple of wks ago was the last one   Hyperlipidemia    takes Atorvastatin daily   Hypertension    takes Benicar and Diltiazem daily   Hypothyroidism    takes Synthroid daily   Itching    takes Atarax daily as needed   Joint pain    Joint pain    Joint swelling    Lactose intolerance    Leg swelling    Liver cyst    Mini stroke (HCC)    Multiple allergies    takes Claritin daily as needed as well as using Flonase if needed   Muscle spasm    takes Baclofen if needed   Osteoarthritis    Peripheral edema    takes Torsemide daily   Pneumonia    hx of   Prediabetes    Primary localized osteoarthritis of left knee 06/21/2014   Primary localized osteoarthritis of right knee 03/21/2015   Restrictive lung disease    Sleep apnea    cpap   Sleep apnea    Swallowing difficulty    TIA (transient ischemic attack)    takes Plavix daily;notices right side is slightly weaker than left   Vertigo    takes Meclizine daily as needed   Vitamin D deficiency  ALLERGIES:  is allergic to bee venom, diphenhydramine hcl, diphenhydramine hcl, doxycycline, oxycodone-acetaminophen, penicillins, percocet [oxycodone-acetaminophen], propoxyphene, and sulfa antibiotics.  MEDICATIONS:  Current Outpatient Medications  Medication Sig Dispense Refill   albuterol (VENTOLIN HFA) 108 (90 Base) MCG/ACT inhaler Inhale 2 puffs into the lungs every 6 (six) hours as needed for wheezing or shortness of breath. 18 g 3   atorvastatin (LIPITOR) 10 MG tablet Take 10 mg by mouth daily.     budesonide (PULMICORT) 0.5 MG/2ML nebulizer solution USE 1 VIAL  IN  NEBULIZER TWICE  DAILY - at 10 AM and 5 PM. Rinse mouth after treatment 120 mL 6   budesonide-formoterol (SYMBICORT) 160-4.5 MCG/ACT inhaler Inhale 2 puffs into the lungs in the morning and at bedtime. 1 each 12   clopidogrel (PLAVIX) 75 MG tablet Take 1 tablet by mouth daily.     diltiazem (CARDIZEM CD) 240 MG 24 hr capsule Take 240 mg by mouth daily.      EPINEPHrine 0.3 mg/0.3 mL IJ SOAJ injection Inject 0.3 mg into the muscle once.     fluticasone (FLONASE) 50 MCG/ACT nasal spray Place 1 spray into both nostrils 2 (two) times daily as needed for allergies or rhinitis.     guaiFENesin (MUCINEX) 600 MG 12 hr tablet Take 600 mg by mouth 2 (two) times daily as needed for to loosen phlegm.     ipratropium-albuterol (DUONEB) 0.5-2.5 (3) MG/3ML SOLN Inhale into the lungs.     Iron-Vitamin C (VITRON-C) 65-125 MG TABS Take by mouth.     levothyroxine (SYNTHROID, LEVOTHROID) 50 MCG tablet Take 50 mcg by mouth daily before breakfast.     loratadine (CLARITIN) 10 MG tablet Take 10 mg by mouth daily as needed for allergies.     metoprolol succinate (TOPROL-XL) 50 MG 24 hr tablet Take 50 mg by mouth daily. Take with or immediately following a meal.     montelukast (SINGULAIR) 10 MG tablet Take 1 tablet (10 mg total) by mouth at bedtime. 90 tablet 3   Multiple Vitamins-Minerals (MULTIVITAMIN WITH MINERALS) tablet Take 1 tablet by mouth daily.     olmesartan-hydrochlorothiazide (BENICAR HCT) 40-25 MG per tablet Take 1 tablet by mouth daily.     Omega-3 Fatty Acids (FISH OIL PO) Take 1 capsule by mouth 2 (two) times daily.     pantoprazole (PROTONIX) 40 MG tablet Take 40 mg by mouth daily.     PERFOROMIST 20 MCG/2ML nebulizer solution USE 1 VIAL  IN  NEBULIZER TWICE  DAILY - morning and evening 120 mL 6   potassium chloride (MICRO-K) 10 MEQ CR capsule Take 30 mEq by mouth daily.      pyridOXINE (B-6) 50 MG tablet Take 50 mg by mouth daily.     Spacer/Aero-Holding Chambers (AEROCHAMBER MV) inhaler Use as instructed (Patient not taking: Reported on 11/13/2020) 1 each 0   torsemide (DEMADEX) 20 MG tablet Take 1 tablet (20 mg total) by mouth as needed. 30 tablet 1   TURMERIC PO Take 1 capsule by mouth daily.     Vitamin D, Ergocalciferol, (DRISDOL) 1.25 MG (50000 UNIT) CAPS capsule Take 1 capsule (50,000 Units total) by mouth every 7 (seven) days. 4 capsule 0    No current facility-administered medications for this visit.    SURGICAL HISTORY:  Past Surgical History:  Procedure Laterality Date   ABDOMINAL HYSTERECTOMY     BACK SURGERY     BREAST SURGERY     cyst removal   CARDIAC CATHETERIZATION     carpel  tunnel Right    COLONOSCOPY     ESOPHAGOGASTRODUODENOSCOPY     KNEE ARTHROSCOPY     TONSILLECTOMY     TOTAL KNEE ARTHROPLASTY Left 06/21/2014   Procedure: LEFT TOTAL KNEE ARTHROPLASTY;  Surgeon: Johnny Bridge, MD;  Location: [facility];  Service: Orthopedics;  Laterality: Left;   TOTAL KNEE ARTHROPLASTY Right 03/21/2015   Procedure: TOTAL KNEE ARTHROPLASTY STEROID INECTION BOTH KNEES;  Surgeon: Marchia Bond, MD;  Location: [facility];  Service: Orthopedics;  Laterality: Right;   trigger thumb      REVIEW OF SYSTEMS:  A comprehensive review of systems was negative except for: Constitutional: positive for fatigue Respiratory: positive for dyspnea on exertion Musculoskeletal: positive for arthralgias and muscle weakness   PHYSICAL EXAMINATION: General appearance: alert, cooperative, fatigued, and no distress Head: Normocephalic, without obvious abnormality, atraumatic Neck: no adenopathy, no JVD, supple, symmetrical, trachea midline, and thyroid not enlarged, symmetric, no tenderness/mass/nodules Lymph nodes: Cervical, supraclavicular, and axillary nodes normal. Resp: clear to auscultation bilaterally Back: symmetric, no curvature. ROM normal. No CVA tenderness. Cardio: regular rate and rhythm, S1, S2 normal, no murmur, click, rub or gallop GI: soft, non-tender; bowel sounds normal; no masses,  no organomegaly Extremities: extremities normal, atraumatic, no cyanosis or edema  ECOG PERFORMANCE STATUS: 1 - Symptomatic but completely ambulatory  Blood pressure 140/81, pulse (!) 53, temperature (!) 97.1 F (36.2 C), temperature source Tympanic, resp. rate 20, height 5\' 5"  (1.651 m), weight (!) 303 lb 11.2 oz (137.8 kg), SpO2 99 %.  LABORATORY  DATA: Lab Results  Component Value Date   WBC 5.9 01/03/2021   HGB 12.0 01/03/2021   HCT 36.2 01/03/2021   MCV 90.5 01/03/2021   PLT 285 01/03/2021      Chemistry      Component Value Date/Time   NA 137 11/01/2020 1115   NA 135 05/15/2020 1129   K 3.6 11/01/2020 1115   CL 96 (L) 11/01/2020 1115   CO2 35 (H) 11/01/2020 1115   BUN 18 11/01/2020 1115   BUN 13 05/15/2020 1129   CREATININE 1.09 (H) 11/01/2020 1115      Component Value Date/Time   CALCIUM 9.2 11/01/2020 1115   ALKPHOS 45 11/01/2020 1115   AST 14 (L) 11/01/2020 1115   ALT 12 11/01/2020 1115   BILITOT 0.3 11/01/2020 1115       RADIOGRAPHIC STUDIES: No results found.  ASSESSMENT AND PLAN: This is a very pleasant 70 years old African-American female with iron deficiency anemia secondary to malabsorption and lack of benefit to the oral iron tablets. The patient was treated with iron infusion with Venofer 300 mg IV weekly for 3 weeks and tolerated her infusion fairly well. She continues on the oral iron tablet with Vitron-C 1 tablet daily and continues to tolerate it well. Repeat CBC today showed improvement of her hemoglobin and hematocrit to the normal range. Iron study and ferritin are still pending. I recommended for the patient to continue with the current oral iron tablet for now and I will continue to monitor her closely for any future need of iron infusion. I will see her back for follow-up visit in 3 months for evaluation and repeat CBC, iron study and ferritin. For the leg cramps, she requested lab for magnesium today and I will order it and forward the results to her primary care physician. The patient was advised to call immediately if she has any other concerning symptoms in the interval. The patient voices understanding of current disease status and  treatment options and is in agreement with the current care plan.  All questions were answered. The patient knows to call the clinic with any problems,  questions or concerns. We can certainly see the patient much sooner if necessary.  The total time spent in the appointment was 20 minutes.  Disclaimer: This note was dictated with voice recognition software. Similar sounding words can inadvertently be transcribed and may not be corrected upon review.

## 2021-01-05 ENCOUNTER — Other Ambulatory Visit: Payer: Self-pay

## 2021-01-05 DIAGNOSIS — D500 Iron deficiency anemia secondary to blood loss (chronic): Secondary | ICD-10-CM

## 2021-01-05 LAB — MAGNESIUM: Magnesium: 2 mg/dL (ref 1.7–2.4)

## 2021-01-08 ENCOUNTER — Telehealth: Payer: Self-pay | Admitting: Internal Medicine

## 2021-01-08 NOTE — Telephone Encounter (Signed)
Scheduled per los. Called and left msg. mailed printout  

## 2021-01-09 DIAGNOSIS — D509 Iron deficiency anemia, unspecified: Secondary | ICD-10-CM | POA: Diagnosis not present

## 2021-01-09 DIAGNOSIS — I088 Other rheumatic multiple valve diseases: Secondary | ICD-10-CM | POA: Diagnosis not present

## 2021-01-09 DIAGNOSIS — Z7902 Long term (current) use of antithrombotics/antiplatelets: Secondary | ICD-10-CM | POA: Diagnosis not present

## 2021-01-09 DIAGNOSIS — I4891 Unspecified atrial fibrillation: Secondary | ICD-10-CM | POA: Diagnosis not present

## 2021-01-09 DIAGNOSIS — Z7982 Long term (current) use of aspirin: Secondary | ICD-10-CM | POA: Diagnosis not present

## 2021-01-09 DIAGNOSIS — Z882 Allergy status to sulfonamides status: Secondary | ICD-10-CM | POA: Diagnosis not present

## 2021-01-09 DIAGNOSIS — Z880 Allergy status to penicillin: Secondary | ICD-10-CM | POA: Diagnosis not present

## 2021-01-09 DIAGNOSIS — R0609 Other forms of dyspnea: Secondary | ICD-10-CM | POA: Diagnosis not present

## 2021-01-09 DIAGNOSIS — Z8673 Personal history of transient ischemic attack (TIA), and cerebral infarction without residual deficits: Secondary | ICD-10-CM | POA: Diagnosis not present

## 2021-01-09 DIAGNOSIS — Z79899 Other long term (current) drug therapy: Secondary | ICD-10-CM | POA: Diagnosis not present

## 2021-01-09 DIAGNOSIS — Z888 Allergy status to other drugs, medicaments and biological substances status: Secondary | ICD-10-CM | POA: Diagnosis not present

## 2021-01-09 DIAGNOSIS — Z885 Allergy status to narcotic agent status: Secondary | ICD-10-CM | POA: Diagnosis not present

## 2021-01-09 DIAGNOSIS — I340 Nonrheumatic mitral (valve) insufficiency: Secondary | ICD-10-CM | POA: Diagnosis not present

## 2021-01-09 DIAGNOSIS — R9439 Abnormal result of other cardiovascular function study: Secondary | ICD-10-CM | POA: Diagnosis not present

## 2021-01-09 DIAGNOSIS — Z86010 Personal history of colonic polyps: Secondary | ICD-10-CM | POA: Diagnosis not present

## 2021-01-09 DIAGNOSIS — I440 Atrioventricular block, first degree: Secondary | ICD-10-CM | POA: Diagnosis not present

## 2021-01-09 DIAGNOSIS — R0600 Dyspnea, unspecified: Secondary | ICD-10-CM | POA: Diagnosis not present

## 2021-01-09 DIAGNOSIS — Z881 Allergy status to other antibiotic agents status: Secondary | ICD-10-CM | POA: Diagnosis not present

## 2021-01-09 DIAGNOSIS — I371 Nonrheumatic pulmonary valve insufficiency: Secondary | ICD-10-CM | POA: Diagnosis not present

## 2021-01-18 DIAGNOSIS — I340 Nonrheumatic mitral (valve) insufficiency: Secondary | ICD-10-CM | POA: Diagnosis not present

## 2021-01-18 DIAGNOSIS — R9439 Abnormal result of other cardiovascular function study: Secondary | ICD-10-CM | POA: Diagnosis not present

## 2021-01-18 DIAGNOSIS — I499 Cardiac arrhythmia, unspecified: Secondary | ICD-10-CM | POA: Diagnosis not present

## 2021-01-18 DIAGNOSIS — D649 Anemia, unspecified: Secondary | ICD-10-CM | POA: Diagnosis not present

## 2021-01-18 DIAGNOSIS — I4891 Unspecified atrial fibrillation: Secondary | ICD-10-CM | POA: Diagnosis not present

## 2021-01-18 DIAGNOSIS — I440 Atrioventricular block, first degree: Secondary | ICD-10-CM | POA: Diagnosis not present

## 2021-01-18 DIAGNOSIS — R0609 Other forms of dyspnea: Secondary | ICD-10-CM | POA: Diagnosis not present

## 2021-01-22 ENCOUNTER — Other Ambulatory Visit: Payer: Self-pay

## 2021-01-22 ENCOUNTER — Ambulatory Visit (INDEPENDENT_AMBULATORY_CARE_PROVIDER_SITE_OTHER): Payer: Medicare Other

## 2021-01-22 VITALS — BP 126/81 | HR 57 | Temp 97.6°F | Resp 18

## 2021-01-22 DIAGNOSIS — J4550 Severe persistent asthma, uncomplicated: Secondary | ICD-10-CM | POA: Diagnosis not present

## 2021-01-22 MED ORDER — METHYLPREDNISOLONE SODIUM SUCC 125 MG IJ SOLR: 125.0000 mg | Freq: Once | INTRAMUSCULAR | Status: DC | PRN

## 2021-01-22 MED ORDER — SODIUM CHLORIDE 0.9 % IV SOLN: Freq: Once | INTRAVENOUS | Status: DC | PRN

## 2021-01-22 MED ORDER — BENRALIZUMAB 30 MG/ML ~~LOC~~ SOSY
30.0000 mg | PREFILLED_SYRINGE | Freq: Once | SUBCUTANEOUS | Status: AC
  Administered 2021-01-22: 30 mg via SUBCUTANEOUS
  Filled 2021-01-22: qty 1

## 2021-01-22 MED ORDER — DIPHENHYDRAMINE HCL 50 MG/ML IJ SOLN: 50.0000 mg | Freq: Once | INTRAMUSCULAR | Status: DC | PRN

## 2021-01-22 MED ORDER — FAMOTIDINE IN NACL 20-0.9 MG/50ML-% IV SOLN: 20.0000 mg | Freq: Once | INTRAVENOUS | Status: DC | PRN

## 2021-01-22 MED ORDER — ALBUTEROL SULFATE HFA 108 (90 BASE) MCG/ACT IN AERS: 2.0000 | INHALATION_SPRAY | Freq: Once | RESPIRATORY_TRACT | Status: DC | PRN

## 2021-01-22 MED ORDER — EPINEPHRINE 0.3 MG/0.3ML IJ SOAJ: 0.3000 mg | Freq: Once | INTRAMUSCULAR | Status: DC | PRN

## 2021-01-22 NOTE — Progress Notes (Signed)
Diagnosis: Asthma  Provider:  Marshell Garfinkel, MD  Procedure: Injection  Fasenra (Benralizumab), Dose: 30 mg, Site: subcutaneous  Discharge: Condition: Good, Destination: Home . AVS provided to patient.   Performed by:  Arnoldo Morale, RN

## 2021-02-07 DIAGNOSIS — M9901 Segmental and somatic dysfunction of cervical region: Secondary | ICD-10-CM | POA: Diagnosis not present

## 2021-02-07 DIAGNOSIS — M5413 Radiculopathy, cervicothoracic region: Secondary | ICD-10-CM | POA: Diagnosis not present

## 2021-02-07 DIAGNOSIS — M9902 Segmental and somatic dysfunction of thoracic region: Secondary | ICD-10-CM | POA: Diagnosis not present

## 2021-02-13 DIAGNOSIS — M9902 Segmental and somatic dysfunction of thoracic region: Secondary | ICD-10-CM | POA: Diagnosis not present

## 2021-02-13 DIAGNOSIS — M5413 Radiculopathy, cervicothoracic region: Secondary | ICD-10-CM | POA: Diagnosis not present

## 2021-02-13 DIAGNOSIS — M9901 Segmental and somatic dysfunction of cervical region: Secondary | ICD-10-CM | POA: Diagnosis not present

## 2021-02-15 DIAGNOSIS — M5413 Radiculopathy, cervicothoracic region: Secondary | ICD-10-CM | POA: Diagnosis not present

## 2021-02-15 DIAGNOSIS — M9902 Segmental and somatic dysfunction of thoracic region: Secondary | ICD-10-CM | POA: Diagnosis not present

## 2021-02-15 DIAGNOSIS — M9901 Segmental and somatic dysfunction of cervical region: Secondary | ICD-10-CM | POA: Diagnosis not present

## 2021-03-06 DIAGNOSIS — M25512 Pain in left shoulder: Secondary | ICD-10-CM | POA: Diagnosis not present

## 2021-03-06 DIAGNOSIS — I10 Essential (primary) hypertension: Secondary | ICD-10-CM | POA: Diagnosis not present

## 2021-03-06 DIAGNOSIS — I480 Paroxysmal atrial fibrillation: Secondary | ICD-10-CM | POA: Diagnosis not present

## 2021-03-06 DIAGNOSIS — M549 Dorsalgia, unspecified: Secondary | ICD-10-CM | POA: Diagnosis not present

## 2021-03-19 ENCOUNTER — Ambulatory Visit (INDEPENDENT_AMBULATORY_CARE_PROVIDER_SITE_OTHER): Payer: Medicare Other

## 2021-03-19 ENCOUNTER — Other Ambulatory Visit: Payer: Self-pay

## 2021-03-19 VITALS — BP 156/89 | HR 57 | Temp 98.0°F | Ht 66.0 in | Wt 306.0 lb

## 2021-03-19 DIAGNOSIS — J4550 Severe persistent asthma, uncomplicated: Secondary | ICD-10-CM

## 2021-03-19 MED ORDER — SODIUM CHLORIDE 0.9 % IV SOLN: Freq: Once | INTRAVENOUS | Status: DC | PRN

## 2021-03-19 MED ORDER — EPINEPHRINE 0.3 MG/0.3ML IJ SOAJ: 0.3000 mg | Freq: Once | INTRAMUSCULAR | Status: DC | PRN

## 2021-03-19 MED ORDER — FAMOTIDINE IN NACL 20-0.9 MG/50ML-% IV SOLN: 20.0000 mg | Freq: Once | INTRAVENOUS | Status: DC | PRN

## 2021-03-19 MED ORDER — BENRALIZUMAB 30 MG/ML ~~LOC~~ SOSY
30.0000 mg | PREFILLED_SYRINGE | Freq: Once | SUBCUTANEOUS | Status: AC
  Administered 2021-03-19: 30 mg via SUBCUTANEOUS

## 2021-03-19 MED ORDER — ALBUTEROL SULFATE HFA 108 (90 BASE) MCG/ACT IN AERS: 2.0000 | INHALATION_SPRAY | Freq: Once | RESPIRATORY_TRACT | Status: DC | PRN

## 2021-03-19 MED ORDER — DIPHENHYDRAMINE HCL 50 MG/ML IJ SOLN: 50.0000 mg | Freq: Once | INTRAMUSCULAR | Status: DC | PRN

## 2021-03-19 MED ORDER — METHYLPREDNISOLONE SODIUM SUCC 125 MG IJ SOLR: 125.0000 mg | Freq: Once | INTRAMUSCULAR | Status: DC | PRN

## 2021-03-19 NOTE — Progress Notes (Signed)
Diagnosis: Asthma  Provider:  Marshell Garfinkel, MD  Procedure: Injection  Fasenra (Benralizumab), Dose: 30 mg, Site: subcutaneous  Discharge: Condition: Good, Destination: Home . AVS provided to patient.   Performed by:  Arnoldo Morale, RN

## 2021-04-04 ENCOUNTER — Other Ambulatory Visit: Payer: Self-pay

## 2021-04-04 ENCOUNTER — Inpatient Hospital Stay: Payer: Medicare Other | Attending: Internal Medicine | Admitting: Internal Medicine

## 2021-04-04 ENCOUNTER — Inpatient Hospital Stay: Payer: Medicare Other

## 2021-04-04 VITALS — BP 136/81 | HR 115 | Temp 97.6°F | Resp 20 | Ht 66.0 in | Wt 307.3 lb

## 2021-04-04 DIAGNOSIS — J45909 Unspecified asthma, uncomplicated: Secondary | ICD-10-CM | POA: Insufficient documentation

## 2021-04-04 DIAGNOSIS — I4891 Unspecified atrial fibrillation: Secondary | ICD-10-CM | POA: Insufficient documentation

## 2021-04-04 DIAGNOSIS — D508 Other iron deficiency anemias: Secondary | ICD-10-CM | POA: Diagnosis not present

## 2021-04-04 DIAGNOSIS — I10 Essential (primary) hypertension: Secondary | ICD-10-CM | POA: Insufficient documentation

## 2021-04-04 DIAGNOSIS — Z79899 Other long term (current) drug therapy: Secondary | ICD-10-CM | POA: Insufficient documentation

## 2021-04-04 DIAGNOSIS — D500 Iron deficiency anemia secondary to blood loss (chronic): Secondary | ICD-10-CM

## 2021-04-04 DIAGNOSIS — Z7902 Long term (current) use of antithrombotics/antiplatelets: Secondary | ICD-10-CM | POA: Insufficient documentation

## 2021-04-04 DIAGNOSIS — Z8673 Personal history of transient ischemic attack (TIA), and cerebral infarction without residual deficits: Secondary | ICD-10-CM | POA: Insufficient documentation

## 2021-04-04 DIAGNOSIS — D509 Iron deficiency anemia, unspecified: Secondary | ICD-10-CM | POA: Insufficient documentation

## 2021-04-04 DIAGNOSIS — E039 Hypothyroidism, unspecified: Secondary | ICD-10-CM | POA: Insufficient documentation

## 2021-04-04 LAB — CBC WITH DIFFERENTIAL (CANCER CENTER ONLY)
Abs Immature Granulocytes: 0 10*3/uL (ref 0.00–0.07)
Basophils Absolute: 0.1 10*3/uL (ref 0.0–0.1)
Basophils Relative: 1 %
Eosinophils Absolute: 0 10*3/uL (ref 0.0–0.5)
Eosinophils Relative: 0 %
HCT: 38 % (ref 36.0–46.0)
Hemoglobin: 12.4 g/dL (ref 12.0–15.0)
Immature Granulocytes: 0 %
Lymphocytes Relative: 40 %
Lymphs Abs: 2.6 10*3/uL (ref 0.7–4.0)
MCH: 31.2 pg (ref 26.0–34.0)
MCHC: 32.6 g/dL (ref 30.0–36.0)
MCV: 95.5 fL (ref 80.0–100.0)
Monocytes Absolute: 0.6 10*3/uL (ref 0.1–1.0)
Monocytes Relative: 10 %
Neutro Abs: 3.2 10*3/uL (ref 1.7–7.7)
Neutrophils Relative %: 49 %
Platelet Count: 301 10*3/uL (ref 150–400)
RBC: 3.98 MIL/uL (ref 3.87–5.11)
RDW: 14.4 % (ref 11.5–15.5)
WBC Count: 6.5 10*3/uL (ref 4.0–10.5)
nRBC: 0 % (ref 0.0–0.2)

## 2021-04-04 LAB — IRON AND TIBC
Iron: 58 ug/dL (ref 28–170)
Saturation Ratios: 14 % (ref 10.4–31.8)
TIBC: 403 ug/dL (ref 250–450)
UIBC: 345 ug/dL

## 2021-04-04 LAB — FERRITIN: Ferritin: 19 ng/mL (ref 11–307)

## 2021-04-04 NOTE — Progress Notes (Signed)
[place] Telephone:(336) [phone]   Fax:(336) [phone]  OFFICE PROGRESS NOTE  Eber Hong, [place] [address]  DIAGNOSIS: Persistent iron deficiency anemia with no clear etiology except for possibility of dietary insufficiency and the patient did not have any improvement in her anemia with the oral iron tablet  PRIOR THERAPY: Iron infusion with Venofer 300 mg IV weekly for 3 weeks.  CURRENT THERAPY: Vitron-C 1 tablet p.o. daily.  INTERVAL HISTORY: Tamara Daugherty 70 y.o. female returns to the clinic today for follow-up visit accompanied by her husband.  The patient is feeling fine today with no concerning complaints.  She denied having any fatigue or weakness.  She denied having any chest pain, shortness of breath except with exertion with no cough or hemoptysis.  She has no nausea, vomiting, diarrhea or constipation.  She continues on the oral iron tablet with Vitron C and tolerating it well except for occasional upset stomach and constipation.  She is here today for evaluation and repeat CBC, iron study and ferritin.  MEDICAL HISTORY: Past Medical History:  Diagnosis Date   Allergy    Anemia    takes Iron daily   Anxiety    Arthritis    Asthma    Symbicort daily and Albuterol daily as needed   B12 deficiency    Back pain    Bilateral swelling of feet    Bruises easily    d/t being on Plavix    Chest pain    Complication of anesthesia    long time to wake up - referred to pulmonary after carpel tunnel procedure - for asthma and undiagnosed sleep apnea   Constipation    Depression    GERD (gastroesophageal reflux disease)    takes Protonix daily   Headache    Heart murmur    [facility] [facility]   Heart murmur    History of blood transfusion    no abnormal reaction noted   History of colon polyps    beningn   History of migraine    couple of wks ago was the last one   Hyperlipidemia    takes Atorvastatin daily    Hypertension    takes Benicar and Diltiazem daily   Hypothyroidism    takes Synthroid daily   Itching    takes Atarax daily as needed   Joint pain    Joint pain    Joint swelling    Lactose intolerance    Leg swelling    Liver cyst    Mini stroke (HCC)    Multiple allergies    takes Claritin daily as needed as well as using Flonase if needed   Muscle spasm    takes Baclofen if needed   Osteoarthritis    Peripheral edema    takes Torsemide daily   Pneumonia    hx of   Prediabetes    Primary localized osteoarthritis of left knee 06/21/2014   Primary localized osteoarthritis of right knee 03/21/2015   Restrictive lung disease    Sleep apnea    cpap   Sleep apnea    Swallowing difficulty    TIA (transient ischemic attack)    takes Plavix daily;notices right side is slightly weaker than left   Vertigo    takes Meclizine daily as needed   Vitamin D deficiency     ALLERGIES:  is allergic to bee venom, diphenhydramine hcl, diphenhydramine hcl, doxycycline, oxycodone-acetaminophen, penicillins, percocet [oxycodone-acetaminophen], propoxyphene,  and sulfa antibiotics.  MEDICATIONS:  Current Outpatient Medications  Medication Sig Dispense Refill   albuterol (VENTOLIN HFA) 108 (90 Base) MCG/ACT inhaler Inhale 2 puffs into the lungs every 6 (six) hours as needed for wheezing or shortness of breath. 18 g 3   atorvastatin (LIPITOR) 10 MG tablet Take 10 mg by mouth daily.     budesonide (PULMICORT) 0.5 MG/2ML nebulizer solution USE 1 VIAL  IN  NEBULIZER TWICE  DAILY - at 10 AM and 5 PM. Rinse mouth after treatment 120 mL 6   budesonide-formoterol (SYMBICORT) 160-4.5 MCG/ACT inhaler Inhale 2 puffs into the lungs in the morning and at bedtime. 1 each 12   clopidogrel (PLAVIX) 75 MG tablet Take 1 tablet by mouth daily.     diltiazem (CARDIZEM CD) 240 MG 24 hr capsule Take 240 mg by mouth daily.     EPINEPHrine 0.3 mg/0.3 mL IJ SOAJ injection Inject 0.3 mg into the muscle once.      fluticasone (FLONASE) 50 MCG/ACT nasal spray Place 1 spray into both nostrils 2 (two) times daily as needed for allergies or rhinitis.     guaiFENesin (MUCINEX) 600 MG 12 hr tablet Take 600 mg by mouth 2 (two) times daily as needed for to loosen phlegm.     ipratropium-albuterol (DUONEB) 0.5-2.5 (3) MG/3ML SOLN Inhale into the lungs.     Iron-Vitamin C (VITRON-C) 65-125 MG TABS Take by mouth.     levothyroxine (SYNTHROID, LEVOTHROID) 50 MCG tablet Take 50 mcg by mouth daily before breakfast.     loratadine (CLARITIN) 10 MG tablet Take 10 mg by mouth daily as needed for allergies.     metoprolol succinate (TOPROL-XL) 50 MG 24 hr tablet Take 50 mg by mouth daily. Take with or immediately following a meal.     montelukast (SINGULAIR) 10 MG tablet Take 1 tablet (10 mg total) by mouth at bedtime. 90 tablet 3   Multiple Vitamins-Minerals (MULTIVITAMIN WITH MINERALS) tablet Take 1 tablet by mouth daily.     olmesartan-hydrochlorothiazide (BENICAR HCT) 40-25 MG per tablet Take 1 tablet by mouth daily.     Omega-3 Fatty Acids (FISH OIL PO) Take 1 capsule by mouth 2 (two) times daily.     pantoprazole (PROTONIX) 40 MG tablet Take 40 mg by mouth daily.     PERFOROMIST 20 MCG/2ML nebulizer solution USE 1 VIAL  IN  NEBULIZER TWICE  DAILY - morning and evening 120 mL 6   potassium chloride (MICRO-K) 10 MEQ CR capsule Take 30 mEq by mouth daily.      pyridOXINE (B-6) 50 MG tablet Take 50 mg by mouth daily.     Spacer/Aero-Holding Chambers (AEROCHAMBER MV) inhaler Use as instructed (Patient not taking: Reported on 11/13/2020) 1 each 0   torsemide (DEMADEX) 20 MG tablet Take 1 tablet (20 mg total) by mouth as needed. 30 tablet 1   TURMERIC PO Take 1 capsule by mouth daily.     Vitamin D, Ergocalciferol, (DRISDOL) 1.25 MG (50000 UNIT) CAPS capsule Take 1 capsule (50,000 Units total) by mouth every 7 (seven) days. 4 capsule 0   No current facility-administered medications for this visit.    SURGICAL HISTORY:   Past Surgical History:  Procedure Laterality Date   ABDOMINAL HYSTERECTOMY     BACK SURGERY     BREAST SURGERY     cyst removal   CARDIAC CATHETERIZATION     carpel tunnel Right    COLONOSCOPY     ESOPHAGOGASTRODUODENOSCOPY     KNEE ARTHROSCOPY  TONSILLECTOMY     TOTAL KNEE ARTHROPLASTY Left 06/21/2014   Procedure: LEFT TOTAL KNEE ARTHROPLASTY;  Surgeon: Johnny Bridge, MD;  Location: [facility];  Service: Orthopedics;  Laterality: Left;   TOTAL KNEE ARTHROPLASTY Right 03/21/2015   Procedure: TOTAL KNEE ARTHROPLASTY STEROID INECTION BOTH KNEES;  Surgeon: Marchia Bond, MD;  Location: [facility];  Service: Orthopedics;  Laterality: Right;   trigger thumb      REVIEW OF SYSTEMS:  A comprehensive review of systems was negative except for: Constitutional: positive for fatigue Respiratory: positive for dyspnea on exertion Musculoskeletal: positive for arthralgias   PHYSICAL EXAMINATION: General appearance: alert, cooperative, fatigued, and no distress Head: Normocephalic, without obvious abnormality, atraumatic Neck: no adenopathy, no JVD, supple, symmetrical, trachea midline, and thyroid not enlarged, symmetric, no tenderness/mass/nodules Lymph nodes: Cervical, supraclavicular, and axillary nodes normal. Resp: clear to auscultation bilaterally Back: symmetric, no curvature. ROM normal. No CVA tenderness. Cardio: regular rate and rhythm, S1, S2 normal, no murmur, click, rub or gallop GI: soft, non-tender; bowel sounds normal; no masses,  no organomegaly Extremities: extremities normal, atraumatic, no cyanosis or edema  ECOG PERFORMANCE STATUS: 1 - Symptomatic but completely ambulatory  Blood pressure 136/81, pulse (!) 115, temperature 97.6 F (36.4 C), temperature source Tympanic, resp. rate 20, height 5\' 6"  (1.676 m), weight (!) 307 lb 4.8 oz (139.4 kg), SpO2 97 %.  LABORATORY DATA: Lab Results  Component Value Date   WBC 6.5 04/04/2021   HGB 12.4 04/04/2021   HCT 38.0 04/04/2021    MCV 95.5 04/04/2021   PLT 301 04/04/2021      Chemistry      Component Value Date/Time   NA 137 11/01/2020 1115   NA 135 05/15/2020 1129   K 3.6 11/01/2020 1115   CL 96 (L) 11/01/2020 1115   CO2 35 (H) 11/01/2020 1115   BUN 18 11/01/2020 1115   BUN 13 05/15/2020 1129   CREATININE 1.09 (H) 11/01/2020 1115      Component Value Date/Time   CALCIUM 9.2 11/01/2020 1115   ALKPHOS 45 11/01/2020 1115   AST 14 (L) 11/01/2020 1115   ALT 12 11/01/2020 1115   BILITOT 0.3 11/01/2020 1115       RADIOGRAPHIC STUDIES: No results found.  ASSESSMENT AND PLAN: This is a very pleasant 70 years old African-American female with iron deficiency anemia secondary to malabsorption and lack of benefit to the oral iron tablets. The patient was treated with iron infusion with Venofer 300 mg IV weekly for 3 weeks and tolerated her infusion fairly well. She continues on the oral iron tablet with Vitron-C 1 tablet daily and continues to tolerate it well. The patient is doing fine today with no concerning complaints except for mild fatigue. Repeat CBC today showed no evidence of anemia but the iron study and ferritin are still pending. I recommended for her to continue on the oral iron tablet for now and we will see her back for follow-up visit in 6 months with repeat CBC, iron study and ferritin unless her iron level and ferritin are very low we may consider her for iron infusion. For the tachycardia, the patient has a history of atrial fibrillation and she is currently on several medication including metoprolol and Cardizem.  She is followed by Dr. Humphrey Rolls in [place].  I recommended for the patient to reach out to Dr. Humphrey Rolls for evaluation and recommendation regarding her condition. She was advised to call immediately if she has any concerning symptoms in the interval. The patient  voices understanding of current disease status and treatment options and is in agreement with the current care plan.  All  questions were answered. The patient knows to call the clinic with any problems, questions or concerns. We can certainly see the patient much sooner if necessary.   Disclaimer: This note was dictated with voice recognition software. Similar sounding words can inadvertently be transcribed and may not be corrected upon review.

## 2021-04-05 ENCOUNTER — Telehealth: Payer: Self-pay | Admitting: Internal Medicine

## 2021-04-05 NOTE — Telephone Encounter (Signed)
Left message with follow-up appointment per 10/12 los.

## 2021-04-09 ENCOUNTER — Other Ambulatory Visit: Payer: Self-pay | Admitting: Pulmonary Disease

## 2021-04-11 DIAGNOSIS — Z23 Encounter for immunization: Secondary | ICD-10-CM | POA: Diagnosis not present

## 2021-04-24 DIAGNOSIS — R300 Dysuria: Secondary | ICD-10-CM | POA: Diagnosis not present

## 2021-04-24 DIAGNOSIS — N390 Urinary tract infection, site not specified: Secondary | ICD-10-CM | POA: Diagnosis not present

## 2021-04-24 DIAGNOSIS — R350 Frequency of micturition: Secondary | ICD-10-CM | POA: Diagnosis not present

## 2021-05-14 ENCOUNTER — Ambulatory Visit (INDEPENDENT_AMBULATORY_CARE_PROVIDER_SITE_OTHER): Payer: Medicare Other | Admitting: *Deleted

## 2021-05-14 ENCOUNTER — Other Ambulatory Visit: Payer: Self-pay

## 2021-05-14 VITALS — BP 182/82 | HR 62 | Temp 98.0°F | Resp 16 | Ht 65.0 in | Wt 309.8 lb

## 2021-05-14 DIAGNOSIS — J4550 Severe persistent asthma, uncomplicated: Secondary | ICD-10-CM | POA: Diagnosis not present

## 2021-05-14 MED ORDER — FAMOTIDINE IN NACL 20-0.9 MG/50ML-% IV SOLN: 20.0000 mg | Freq: Once | INTRAVENOUS | Status: DC | PRN

## 2021-05-14 MED ORDER — ALBUTEROL SULFATE HFA 108 (90 BASE) MCG/ACT IN AERS: 2.0000 | INHALATION_SPRAY | Freq: Once | RESPIRATORY_TRACT | Status: DC | PRN

## 2021-05-14 MED ORDER — METHYLPREDNISOLONE SODIUM SUCC 125 MG IJ SOLR: 125.0000 mg | Freq: Once | INTRAMUSCULAR | Status: DC | PRN

## 2021-05-14 MED ORDER — EPINEPHRINE 0.3 MG/0.3ML IJ SOAJ: 0.3000 mg | Freq: Once | INTRAMUSCULAR | Status: DC | PRN

## 2021-05-14 MED ORDER — SODIUM CHLORIDE 0.9 % IV SOLN: Freq: Once | INTRAVENOUS | Status: DC | PRN

## 2021-05-14 MED ORDER — DIPHENHYDRAMINE HCL 50 MG/ML IJ SOLN: 50.0000 mg | Freq: Once | INTRAMUSCULAR | Status: DC | PRN

## 2021-05-14 MED ORDER — BENRALIZUMAB 30 MG/ML ~~LOC~~ SOSY
30.0000 mg | PREFILLED_SYRINGE | Freq: Once | SUBCUTANEOUS | Status: AC
  Administered 2021-05-14: 30 mg via SUBCUTANEOUS
  Filled 2021-05-14: qty 1

## 2021-05-14 NOTE — Progress Notes (Signed)
Diagnosis: Asthma  Provider:  Praveen Mannam, MD  Procedure: Injection  Fasenra (Benralizumab), Dose: 30 mg, Site: subcutaneous  Discharge: Condition: Good, Destination: Home . AVS provided to patient.   Performed by:  Mohamud Mrozek A, RN       

## 2021-05-22 DIAGNOSIS — J4540 Moderate persistent asthma, uncomplicated: Secondary | ICD-10-CM | POA: Diagnosis not present

## 2021-05-22 DIAGNOSIS — Z6841 Body Mass Index (BMI) 40.0 and over, adult: Secondary | ICD-10-CM | POA: Diagnosis not present

## 2021-05-22 DIAGNOSIS — I10 Essential (primary) hypertension: Secondary | ICD-10-CM | POA: Diagnosis not present

## 2021-05-22 DIAGNOSIS — I480 Paroxysmal atrial fibrillation: Secondary | ICD-10-CM | POA: Diagnosis not present

## 2021-05-30 ENCOUNTER — Encounter: Payer: Self-pay | Admitting: Pulmonary Disease

## 2021-05-31 MED ORDER — PREDNISONE 10 MG PO TABS: ORAL_TABLET | ORAL | 0 refills | Status: AC

## 2021-05-31 NOTE — Telephone Encounter (Signed)
Dr. Halford Chessman this message was received.  I had a Berna Bue shot the end of November; however, I am having a difficult time breathing when walking.  I have been using my nebulizer and Symbicort as prescribed.     My chest feels tight, and I am wheezing.I am trying to avoid bronchitis-- so to help open up my lungs, would you prescribe a round of Prednisone?    Message routed to Dr. Halford Chessman to advise on Prednisone

## 2021-05-31 NOTE — Telephone Encounter (Signed)
Please let her know I have sent in a script for prednisone taper.

## 2021-06-01 NOTE — Telephone Encounter (Signed)
I don't prescribe weight loss medications.  She could speak to her PCP about this.

## 2021-06-01 NOTE — Telephone Encounter (Signed)
I would recommend she enroll with [facility] to develop a more holistic approach to weight loss.  Please provider with the contact information for this.

## 2021-06-01 NOTE — Telephone Encounter (Signed)
VS any further recs?  thanks

## 2021-06-01 NOTE — Telephone Encounter (Signed)
VS please advise. Thanks  Dr. Halford Chessman told me at my previous office visit that losing weight would really help me with my asthma and breathing as well as blood pressure issues. I have considered the gastric bypass surgery and stomach stapling.but first wondered if there is anything he can prescribe that would help control my appetite so I can lose some weight?  Dieting and weight loss programs alone are not the answer for me..and I have tried several of them.  I need help.what does he advise?

## 2021-06-18 DIAGNOSIS — U071 COVID-19: Secondary | ICD-10-CM | POA: Diagnosis not present

## 2021-07-09 ENCOUNTER — Ambulatory Visit (INDEPENDENT_AMBULATORY_CARE_PROVIDER_SITE_OTHER): Payer: Medicare Other

## 2021-07-09 ENCOUNTER — Other Ambulatory Visit: Payer: Self-pay

## 2021-07-09 VITALS — BP 129/64 | HR 65 | Temp 97.6°F | Resp 18 | Ht 65.0 in | Wt 307.6 lb

## 2021-07-09 DIAGNOSIS — J4550 Severe persistent asthma, uncomplicated: Secondary | ICD-10-CM | POA: Diagnosis not present

## 2021-07-09 MED ORDER — FAMOTIDINE IN NACL 20-0.9 MG/50ML-% IV SOLN: 20.0000 mg | Freq: Once | INTRAVENOUS | Status: DC | PRN

## 2021-07-09 MED ORDER — BENRALIZUMAB 30 MG/ML ~~LOC~~ SOSY
30.0000 mg | PREFILLED_SYRINGE | Freq: Once | SUBCUTANEOUS | Status: AC
  Administered 2021-07-09: 30 mg via SUBCUTANEOUS

## 2021-07-09 MED ORDER — DIPHENHYDRAMINE HCL 50 MG/ML IJ SOLN: 50.0000 mg | Freq: Once | INTRAMUSCULAR | Status: DC | PRN

## 2021-07-09 MED ORDER — METHYLPREDNISOLONE SODIUM SUCC 125 MG IJ SOLR: 125.0000 mg | Freq: Once | INTRAMUSCULAR | Status: DC | PRN

## 2021-07-09 MED ORDER — SODIUM CHLORIDE 0.9 % IV SOLN: Freq: Once | INTRAVENOUS | Status: DC | PRN

## 2021-07-09 MED ORDER — ALBUTEROL SULFATE HFA 108 (90 BASE) MCG/ACT IN AERS: 2.0000 | INHALATION_SPRAY | Freq: Once | RESPIRATORY_TRACT | Status: DC | PRN

## 2021-07-09 MED ORDER — EPINEPHRINE 0.3 MG/0.3ML IJ SOAJ: 0.3000 mg | Freq: Once | INTRAMUSCULAR | Status: DC | PRN

## 2021-07-09 NOTE — Progress Notes (Signed)
Diagnosis: Severe persistent asthma  Provider:  Marshell Garfinkel, MD  Procedure: Injection  Fasenra (Benralizumab), Dose: 30 mg, Site: subcutaneous, Number of injections: 1  Discharge: Condition: Good, Destination: Home . AVS provided to patient.   Performed by:  Charlie Pitter, RN

## 2021-07-23 ENCOUNTER — Other Ambulatory Visit: Payer: Self-pay | Admitting: Pharmacy Technician

## 2021-07-23 ENCOUNTER — Encounter: Payer: Self-pay | Admitting: Pulmonary Disease

## 2021-08-03 DIAGNOSIS — G4733 Obstructive sleep apnea (adult) (pediatric): Secondary | ICD-10-CM | POA: Diagnosis not present

## 2021-08-03 DIAGNOSIS — Z6841 Body Mass Index (BMI) 40.0 and over, adult: Secondary | ICD-10-CM | POA: Diagnosis not present

## 2021-08-03 DIAGNOSIS — I480 Paroxysmal atrial fibrillation: Secondary | ICD-10-CM | POA: Diagnosis not present

## 2021-08-03 DIAGNOSIS — I10 Essential (primary) hypertension: Secondary | ICD-10-CM | POA: Diagnosis not present

## 2021-08-23 DIAGNOSIS — Z20822 Contact with and (suspected) exposure to covid-19: Secondary | ICD-10-CM | POA: Diagnosis not present

## 2021-09-03 ENCOUNTER — Ambulatory Visit (INDEPENDENT_AMBULATORY_CARE_PROVIDER_SITE_OTHER): Payer: Medicare Other

## 2021-09-03 ENCOUNTER — Other Ambulatory Visit: Payer: Self-pay

## 2021-09-03 VITALS — BP 125/81 | HR 65 | Temp 98.0°F | Resp 18 | Ht 65.0 in | Wt 300.0 lb

## 2021-09-03 DIAGNOSIS — J4550 Severe persistent asthma, uncomplicated: Secondary | ICD-10-CM

## 2021-09-03 MED ORDER — BENRALIZUMAB 30 MG/ML ~~LOC~~ SOSY
30.0000 mg | PREFILLED_SYRINGE | Freq: Once | SUBCUTANEOUS | Status: AC
  Administered 2021-09-03: 30 mg via SUBCUTANEOUS
  Filled 2021-09-03: qty 1

## 2021-09-03 NOTE — Progress Notes (Signed)
Diagnosis: Asthma ? ?Provider:  Marshell Garfinkel, MD ? ?Procedure: Injection ? ?Benralizumab Berna Bue), Dose: 30 mg, Site: subcutaneous, Number of injections: 1 ? ?Discharge: Condition: Good, Destination: Home . AVS provided to patient.  ? ?Performed by:  Allis Quirarte, RN  ? ? ? ?  ?

## 2021-09-05 ENCOUNTER — Encounter: Payer: Self-pay | Admitting: Pulmonary Disease

## 2021-09-05 NOTE — Telephone Encounter (Signed)
Dr. Halford Chessman please see pt's email regarding [place] tea. Pt is questioning if there are known drug interactions with the meds she is on.  ?

## 2021-09-07 DIAGNOSIS — R7303 Prediabetes: Secondary | ICD-10-CM | POA: Diagnosis not present

## 2021-09-07 DIAGNOSIS — I639 Cerebral infarction, unspecified: Secondary | ICD-10-CM | POA: Diagnosis not present

## 2021-09-07 DIAGNOSIS — I480 Paroxysmal atrial fibrillation: Secondary | ICD-10-CM | POA: Diagnosis not present

## 2021-09-07 DIAGNOSIS — E782 Mixed hyperlipidemia: Secondary | ICD-10-CM | POA: Diagnosis not present

## 2021-09-07 DIAGNOSIS — Z79899 Other long term (current) drug therapy: Secondary | ICD-10-CM | POA: Diagnosis not present

## 2021-09-07 DIAGNOSIS — I10 Essential (primary) hypertension: Secondary | ICD-10-CM | POA: Diagnosis not present

## 2021-09-07 DIAGNOSIS — Z7902 Long term (current) use of antithrombotics/antiplatelets: Secondary | ICD-10-CM | POA: Diagnosis not present

## 2021-09-07 DIAGNOSIS — D508 Other iron deficiency anemias: Secondary | ICD-10-CM | POA: Diagnosis not present

## 2021-09-07 DIAGNOSIS — E039 Hypothyroidism, unspecified: Secondary | ICD-10-CM | POA: Diagnosis not present

## 2021-09-07 LAB — TSH: TSH: 1.06 (ref 0.41–5.90)

## 2021-09-11 DIAGNOSIS — E782 Mixed hyperlipidemia: Secondary | ICD-10-CM | POA: Diagnosis not present

## 2021-09-11 DIAGNOSIS — E039 Hypothyroidism, unspecified: Secondary | ICD-10-CM | POA: Diagnosis not present

## 2021-09-11 DIAGNOSIS — Z0000 Encounter for general adult medical examination without abnormal findings: Secondary | ICD-10-CM | POA: Diagnosis not present

## 2021-09-11 DIAGNOSIS — I10 Essential (primary) hypertension: Secondary | ICD-10-CM | POA: Diagnosis not present

## 2021-09-11 DIAGNOSIS — I639 Cerebral infarction, unspecified: Secondary | ICD-10-CM | POA: Diagnosis not present

## 2021-10-01 ENCOUNTER — Other Ambulatory Visit: Payer: Self-pay | Admitting: Physician Assistant

## 2021-10-01 DIAGNOSIS — D508 Other iron deficiency anemias: Secondary | ICD-10-CM

## 2021-10-02 ENCOUNTER — Inpatient Hospital Stay: Payer: Medicare Other

## 2021-10-02 ENCOUNTER — Other Ambulatory Visit: Payer: Self-pay | Admitting: Internal Medicine

## 2021-10-02 ENCOUNTER — Inpatient Hospital Stay: Payer: Medicare Other | Attending: Internal Medicine | Admitting: Internal Medicine

## 2021-10-02 VITALS — BP 112/73 | HR 73 | Temp 96.0°F | Resp 20 | Ht 65.0 in | Wt 294.6 lb

## 2021-10-02 DIAGNOSIS — D508 Other iron deficiency anemias: Secondary | ICD-10-CM | POA: Insufficient documentation

## 2021-10-02 DIAGNOSIS — K909 Intestinal malabsorption, unspecified: Secondary | ICD-10-CM | POA: Diagnosis not present

## 2021-10-02 DIAGNOSIS — D509 Iron deficiency anemia, unspecified: Secondary | ICD-10-CM

## 2021-10-02 DIAGNOSIS — Z79899 Other long term (current) drug therapy: Secondary | ICD-10-CM | POA: Insufficient documentation

## 2021-10-02 LAB — CBC WITH DIFFERENTIAL (CANCER CENTER ONLY)
Abs Immature Granulocytes: 0.01 10*3/uL (ref 0.00–0.07)
Basophils Absolute: 0.1 10*3/uL (ref 0.0–0.1)
Basophils Relative: 1 %
Eosinophils Absolute: 0 10*3/uL (ref 0.0–0.5)
Eosinophils Relative: 0 %
HCT: 32 % — ABNORMAL LOW (ref 36.0–46.0)
Hemoglobin: 10.6 g/dL — ABNORMAL LOW (ref 12.0–15.0)
Immature Granulocytes: 0 %
Lymphocytes Relative: 38 %
Lymphs Abs: 2.3 10*3/uL (ref 0.7–4.0)
MCH: 28.1 pg (ref 26.0–34.0)
MCHC: 33.1 g/dL (ref 30.0–36.0)
MCV: 84.9 fL (ref 80.0–100.0)
Monocytes Absolute: 0.8 10*3/uL (ref 0.1–1.0)
Monocytes Relative: 13 %
Neutro Abs: 2.9 10*3/uL (ref 1.7–7.7)
Neutrophils Relative %: 48 %
Platelet Count: 399 10*3/uL (ref 150–400)
RBC: 3.77 MIL/uL — ABNORMAL LOW (ref 3.87–5.11)
RDW: 16.2 % — ABNORMAL HIGH (ref 11.5–15.5)
WBC Count: 6.1 10*3/uL (ref 4.0–10.5)
nRBC: 0 % (ref 0.0–0.2)

## 2021-10-02 LAB — IRON AND IRON BINDING CAPACITY (CC-WL,HP ONLY)
Iron: 82 ug/dL (ref 28–170)
Saturation Ratios: 18 % (ref 10.4–31.8)
TIBC: 461 ug/dL — ABNORMAL HIGH (ref 250–450)
UIBC: 379 ug/dL (ref 148–442)

## 2021-10-02 LAB — FERRITIN: Ferritin: 7 ng/mL — ABNORMAL LOW (ref 11–307)

## 2021-10-02 NOTE — Progress Notes (Signed)
?    [facility] ?[phone]   [phone] ? ?OFFICE PROGRESS NOTE ? ?Eber Hong, MD ?[address] ?[place] [address] ? ?DIAGNOSIS: Persistent iron deficiency anemia with no clear etiology except for possibility of dietary insufficiency and the patient did not have any improvement in her anemia with the oral iron tablet ? ?PRIOR THERAPY: Iron infusion with Venofer 300 mg IV weekly for 3 weeks. ? ?CURRENT THERAPY: Vitron-C 1 tablet p.o. daily. ? ?INTERVAL HISTORY: ?Tamara Daugherty 71 y.o. female returns to the clinic today for follow-up visit accompanied by her husband.  The patient is feeling fine today with no concerning complaints except for mild fatigue.  She continues to tolerate the oral iron tablet fairly well.  She denied having any current chest pain, shortness of breath, cough or hemoptysis.  She has no nausea, vomiting, diarrhea but has occasional constipation.  She has no recent weight loss or night sweats.  She is here today for evaluation and repeat CBC, iron study and ferritin. ? ?MEDICAL HISTORY: ?Past Medical History:  ?Diagnosis Date  ? Allergy   ? Anemia   ? takes Iron daily  ? Anxiety   ? Arthritis   ? Asthma   ? Symbicort daily and Albuterol daily as needed  ? B12 deficiency   ? Back pain   ? Bilateral swelling of feet   ? Bruises easily   ? d/t being on Plavix   ? Chest pain   ? Complication of anesthesia   ? long time to wake up - referred to pulmonary after carpel tunnel procedure - for asthma and undiagnosed sleep apnea  ? Constipation   ? Depression   ? GERD (gastroesophageal reflux disease)   ? takes Protonix daily  ? Headache   ? Heart murmur   ? [facility] - [place] [facility]  ? Heart murmur   ? History of blood transfusion   ? no abnormal reaction noted  ? History of colon polyps   ? beningn  ? History of migraine   ? couple of wks ago was the last one  ? Hyperlipidemia   ? takes Atorvastatin daily  ? Hypertension   ? takes Benicar and Diltiazem  daily  ? Hypothyroidism   ? takes Synthroid daily  ? Itching   ? takes Atarax daily as needed  ? Joint pain   ? Joint pain   ? Joint swelling   ? Lactose intolerance   ? Leg swelling   ? Liver cyst   ? Mini stroke ([place])   ? Multiple allergies   ? takes Claritin daily as needed as well as using Flonase if needed  ? Muscle spasm   ? takes Baclofen if needed  ? Osteoarthritis   ? Peripheral edema   ? takes Torsemide daily  ? Pneumonia   ? hx of  ? Prediabetes   ? Primary localized osteoarthritis of left knee 06/21/2014  ? Primary localized osteoarthritis of right knee 03/21/2015  ? Restrictive lung disease   ? Sleep apnea   ? cpap  ? Sleep apnea   ? Swallowing difficulty   ? TIA (transient ischemic attack)   ? takes Plavix daily;notices right side is slightly weaker than left  ? Vertigo   ? takes Meclizine daily as needed  ? Vitamin D deficiency   ? ? ?ALLERGIES:  is allergic to bee venom, diphenhydramine hcl, diphenhydramine hcl, doxycycline, oxycodone-acetaminophen, penicillins, percocet [oxycodone-acetaminophen], propoxyphene, and sulfa antibiotics. ? ?MEDICATIONS:  ?Current  Outpatient Medications  ?Medication Sig Dispense Refill  ? albuterol (VENTOLIN HFA) 108 (90 Base) MCG/ACT inhaler Inhale 2 puffs into the lungs every 6 (six) hours as needed for wheezing or shortness of breath. 18 g 3  ? atorvastatin (LIPITOR) 10 MG tablet Take 10 mg by mouth daily.    ? budesonide (PULMICORT) 0.5 MG/2ML nebulizer solution USE 1 VIAL  IN  NEBULIZER TWICE  DAILY - at 10 AM and 5 PM. Rinse mouth after treatment 120 mL 7  ? budesonide-formoterol (SYMBICORT) 160-4.5 MCG/ACT inhaler Inhale 2 puffs into the lungs in the morning and at bedtime. 1 each 12  ? clopidogrel (PLAVIX) 75 MG tablet Take 1 tablet by mouth daily.    ? diltiazem (CARDIZEM CD) 240 MG 24 hr capsule Take 240 mg by mouth daily.    ? EPINEPHrine 0.3 mg/0.3 mL IJ SOAJ injection Inject 0.3 mg into the muscle once.    ? fluticasone (FLONASE) 50 MCG/ACT nasal spray Place 1  spray into both nostrils 2 (two) times daily as needed for allergies or rhinitis.    ? guaiFENesin (MUCINEX) 600 MG 12 hr tablet Take 600 mg by mouth 2 (two) times daily as needed for to loosen phlegm.    ? ipratropium-albuterol (DUONEB) 0.5-2.5 (3) MG/3ML SOLN Inhale into the lungs.    ? Iron-Vitamin C (VITRON-C) 65-125 MG TABS Take by mouth.    ? levothyroxine (SYNTHROID, LEVOTHROID) 50 MCG tablet Take 50 mcg by mouth daily before breakfast.    ? loratadine (CLARITIN) 10 MG tablet Take 10 mg by mouth daily as needed for allergies.    ? metoprolol succinate (TOPROL-XL) 50 MG 24 hr tablet Take 50 mg by mouth daily. Take with or immediately following a meal.    ? montelukast (SINGULAIR) 10 MG tablet Take 1 tablet (10 mg total) by mouth at bedtime. 90 tablet 3  ? Multiple Vitamins-Minerals (MULTIVITAMIN WITH MINERALS) tablet Take 1 tablet by mouth daily.    ? olmesartan-hydrochlorothiazide (BENICAR HCT) 40-25 MG per tablet Take 1 tablet by mouth daily.    ? Omega-3 Fatty Acids (FISH OIL PO) Take 1 capsule by mouth 2 (two) times daily.    ? pantoprazole (PROTONIX) 40 MG tablet Take 40 mg by mouth daily.    ? PERFOROMIST 20 MCG/2ML nebulizer solution USE 1 VIAL  IN  NEBULIZER TWICE  DAILY - morning and evening 120 mL 7  ? potassium chloride (MICRO-K) 10 MEQ CR capsule Take 30 mEq by mouth daily.     ? pyridOXINE (B-6) 50 MG tablet Take 50 mg by mouth daily.    ? Spacer/Aero-Holding Chambers (AEROCHAMBER MV) inhaler Use as instructed (Patient not taking: Reported on 11/13/2020) 1 each 0  ? torsemide (DEMADEX) 20 MG tablet Take 1 tablet (20 mg total) by mouth as needed. 30 tablet 1  ? TURMERIC PO Take 1 capsule by mouth daily.    ? Vitamin D, Ergocalciferol, (DRISDOL) 1.25 MG (50000 UNIT) CAPS capsule Take 1 capsule (50,000 Units total) by mouth every 7 (seven) days. 4 capsule 0  ? ?No current facility-administered medications for this visit.  ? ? ?SURGICAL HISTORY:  ?Past Surgical History:  ?Procedure Laterality Date  ?  ABDOMINAL HYSTERECTOMY    ? BACK SURGERY    ? BREAST SURGERY    ? cyst removal  ? CARDIAC CATHETERIZATION    ? carpel tunnel Right   ? COLONOSCOPY    ? ESOPHAGOGASTRODUODENOSCOPY    ? KNEE ARTHROSCOPY    ? TONSILLECTOMY    ?  TOTAL KNEE ARTHROPLASTY Left 06/21/2014  ? Procedure: LEFT TOTAL KNEE ARTHROPLASTY;  Surgeon: Johnny Bridge, MD;  Location: [facility];  Service: Orthopedics;  Laterality: Left;  ? TOTAL KNEE ARTHROPLASTY Right 03/21/2015  ? Procedure: TOTAL KNEE ARTHROPLASTY STEROID INECTION BOTH KNEES;  Surgeon: Marchia Bond, MD;  Location: [facility];  Service: Orthopedics;  Laterality: Right;  ? trigger thumb    ? ? ?REVIEW OF SYSTEMS:  A comprehensive review of systems was negative except for: Constitutional: positive for fatigue ?Respiratory: positive for dyspnea on exertion ?Musculoskeletal: positive for arthralgias  ? ?PHYSICAL EXAMINATION: General appearance: alert, cooperative, fatigued, and no distress ?Head: Normocephalic, without obvious abnormality, atraumatic ?Neck: no adenopathy, no JVD, supple, symmetrical, trachea midline, and thyroid not enlarged, symmetric, no tenderness/mass/nodules ?Lymph nodes: Cervical, supraclavicular, and axillary nodes normal. ?Resp: clear to auscultation bilaterally ?Back: symmetric, no curvature. ROM normal. No CVA tenderness. ?Cardio: regular rate and rhythm, S1, S2 normal, no murmur, click, rub or gallop ?GI: soft, non-tender; bowel sounds normal; no masses,  no organomegaly ?Extremities: extremities normal, atraumatic, no cyanosis or edema ? ?ECOG PERFORMANCE STATUS: 1 - Symptomatic but completely ambulatory ? ?Blood pressure 112/73, pulse 73, temperature (!) 96 ?F (35.6 ?C), temperature source Tympanic, resp. rate 20, height '5\' 5"'$  (1.651 m), weight 294 lb 9.6 oz (133.6 kg), SpO2 100 %. ? ?LABORATORY DATA: ?Lab Results  ?Component Value Date  ? WBC 6.1 10/02/2021  ? HGB 10.6 (L) 10/02/2021  ? HCT 32.0 (L) 10/02/2021  ? MCV 84.9 10/02/2021  ? PLT 399 10/02/2021  ? ? ?   Chemistry   ?   ?Component Value Date/Time  ? NA 137 11/01/2020 1115  ? NA 135 05/15/2020 1129  ? K 3.6 11/01/2020 1115  ? CL 96 (L) 11/01/2020 1115  ? CO2 35 (H) 11/01/2020 1115  ? BUN 18 11/01/2020 111

## 2021-10-03 ENCOUNTER — Other Ambulatory Visit: Payer: Self-pay | Admitting: Pharmacy Technician

## 2021-10-04 ENCOUNTER — Ambulatory Visit (INDEPENDENT_AMBULATORY_CARE_PROVIDER_SITE_OTHER): Payer: Medicare Other | Admitting: *Deleted

## 2021-10-04 VITALS — BP 109/74 | HR 110 | Temp 98.3°F | Resp 18 | Ht 65.0 in | Wt 293.8 lb

## 2021-10-04 DIAGNOSIS — D509 Iron deficiency anemia, unspecified: Secondary | ICD-10-CM

## 2021-10-04 DIAGNOSIS — J4550 Severe persistent asthma, uncomplicated: Secondary | ICD-10-CM

## 2021-10-04 MED ORDER — SODIUM CHLORIDE 0.9 % IV SOLN
300.0000 mg | INTRAVENOUS | Status: DC
  Administered 2021-10-04: 300 mg via INTRAVENOUS
  Filled 2021-10-04: qty 15

## 2021-10-04 NOTE — Progress Notes (Signed)
Diagnosis: Iron Deficiency Anemia ? ?Provider:  Marshell Garfinkel, MD ? ?Procedure: Infusion ? ?IV Type: Peripheral, IV Location: L Antecubital ? ?Venofer (Iron Sucrose),  Dose: 300 mg ? ?Infusion Start Time: 1121 am ? ?Infusion Stop Time: 1259 pm ? ?Post Infusion IV Care: Observation period completed and Peripheral IV Discontinued ? ?Discharge: Condition: Good, Destination: Home . AVS provided to patient.  ? ?Performed by:  Oren Beckmann, RN  ?  ?

## 2021-10-08 ENCOUNTER — Other Ambulatory Visit: Payer: Self-pay | Admitting: Pulmonary Disease

## 2021-10-08 DIAGNOSIS — Z20822 Contact with and (suspected) exposure to covid-19: Secondary | ICD-10-CM | POA: Diagnosis not present

## 2021-10-11 ENCOUNTER — Ambulatory Visit (INDEPENDENT_AMBULATORY_CARE_PROVIDER_SITE_OTHER): Payer: Medicare Other

## 2021-10-11 VITALS — BP 113/69 | HR 68 | Temp 98.0°F | Resp 18 | Ht 65.0 in | Wt 292.8 lb

## 2021-10-11 DIAGNOSIS — D509 Iron deficiency anemia, unspecified: Secondary | ICD-10-CM

## 2021-10-11 MED ORDER — SODIUM CHLORIDE 0.9 % IV SOLN
300.0000 mg | INTRAVENOUS | Status: DC
  Administered 2021-10-11: 300 mg via INTRAVENOUS
  Filled 2021-10-11: qty 15

## 2021-10-11 NOTE — Progress Notes (Signed)
Diagnosis: Iron Deficiency Anemia ? ?Provider:  Marshell Garfinkel, MD ? ?Procedure: Infusion ? ?IV Type: Peripheral, IV Location: R Antecubital ? ?Venofer (Iron Sucrose), Dose: 300 mg ? ?Infusion Start Time: 1111 ? ?Infusion Stop Time: 1250 ? ?Post Infusion IV Care: Peripheral IV Discontinued ? ?Discharge: Condition: Good, Destination: Home . AVS provided to patient.  ? ?Performed by:  Koren Shiver, RN  ?  ?

## 2021-10-17 ENCOUNTER — Telehealth: Payer: Self-pay | Admitting: Medical Oncology

## 2021-10-17 NOTE — Telephone Encounter (Signed)
She is scheduled for tooth extraction today and wanted to know if she can still take the iron infusion ( 3/3) tomorrow. ? ?I told her she can still get her iron infusion tomorrow . ?

## 2021-10-18 ENCOUNTER — Ambulatory Visit (INDEPENDENT_AMBULATORY_CARE_PROVIDER_SITE_OTHER): Payer: Medicare Other

## 2021-10-18 VITALS — BP 114/76 | HR 69 | Temp 98.0°F | Resp 18 | Ht 65.0 in | Wt 293.6 lb

## 2021-10-18 DIAGNOSIS — D509 Iron deficiency anemia, unspecified: Secondary | ICD-10-CM | POA: Diagnosis not present

## 2021-10-18 MED ORDER — SODIUM CHLORIDE 0.9 % IV SOLN
300.0000 mg | INTRAVENOUS | Status: DC
  Administered 2021-10-18: 300 mg via INTRAVENOUS
  Filled 2021-10-18: qty 15

## 2021-10-18 NOTE — Progress Notes (Addendum)
Diagnosis: Iron Deficiency Anemia ? ?Provider:  Marshell Garfinkel, MD ? ?Procedure: Infusion ? ?IV Type: Peripheral, IV Location: L Antecubital ? ?Venofer (Iron Sucrose), Dose: 300 mg ? ?Infusion Start Time: 1112 ? ?Infusion Stop Time: 4734 ? ?Post Infusion IV Care: Peripheral IV Discontinued ? ?Discharge: Condition: Good, Destination: Home . AVS provided to patient.  ? ?Performed by:  Paul Dykes, RN  ?  ?

## 2021-10-26 DIAGNOSIS — U071 COVID-19: Secondary | ICD-10-CM | POA: Diagnosis not present

## 2021-10-29 ENCOUNTER — Ambulatory Visit (INDEPENDENT_AMBULATORY_CARE_PROVIDER_SITE_OTHER): Payer: Medicare Other

## 2021-10-29 VITALS — BP 119/72 | HR 64 | Temp 97.6°F | Resp 18 | Ht 65.0 in | Wt 294.6 lb

## 2021-10-29 DIAGNOSIS — Z20822 Contact with and (suspected) exposure to covid-19: Secondary | ICD-10-CM | POA: Diagnosis not present

## 2021-10-29 DIAGNOSIS — J4550 Severe persistent asthma, uncomplicated: Secondary | ICD-10-CM

## 2021-10-29 MED ORDER — BENRALIZUMAB 30 MG/ML ~~LOC~~ SOSY
30.0000 mg | PREFILLED_SYRINGE | Freq: Once | SUBCUTANEOUS | Status: AC
  Administered 2021-10-29: 30 mg via SUBCUTANEOUS

## 2021-10-29 NOTE — Progress Notes (Signed)
Diagnosis: Iron Deficiency Anemia ? ?Provider:  Marshell Garfinkel, MD ? ?Procedure: Injection ? ?Fasenra (Benralizumab), Dose: 30 mg, Site: subcutaneous, Number of injections: 1 ? ?Discharge: Condition: Good, Destination: Home . AVS provided to patient.  ? ?Performed by:  Cleophus Molt, RN  ? ? ? ?  ?

## 2021-11-08 DIAGNOSIS — Z1231 Encounter for screening mammogram for malignant neoplasm of breast: Secondary | ICD-10-CM | POA: Diagnosis not present

## 2021-11-28 ENCOUNTER — Encounter: Payer: Self-pay | Admitting: Internal Medicine

## 2021-12-04 ENCOUNTER — Encounter: Payer: Self-pay | Admitting: Internal Medicine

## 2021-12-04 ENCOUNTER — Ambulatory Visit (INDEPENDENT_AMBULATORY_CARE_PROVIDER_SITE_OTHER): Payer: Medicare Other | Admitting: Internal Medicine

## 2021-12-04 VITALS — BP 124/79 | HR 71 | Ht 65.0 in | Wt 292.4 lb

## 2021-12-04 DIAGNOSIS — I340 Nonrheumatic mitral (valve) insufficiency: Secondary | ICD-10-CM

## 2021-12-04 DIAGNOSIS — R2990 Unspecified symptoms and signs involving the nervous system: Secondary | ICD-10-CM | POA: Diagnosis not present

## 2021-12-04 NOTE — Progress Notes (Signed)
Cardiology Office Note:    Date:  12/04/2021   ID:  [name], [name] 12/03/1950, MRN [id number]  PCP:  Eber Hong, MD   [facility] Providers Cardiologist:  None     Referring MD: Eber Hong, MD   No chief complaint on file. Leg cramping/numbess  History of Present Illness:    Tamara Daugherty is a 71 y.o. female with a hx of asthma, GERD, TIA, hypothyroidism, hypertension, sleep apnea, obesity, iron deficiency anemia on IV iron, referral for "numbness"  She notes June 3rd, she was sitting on the toilet. She notes leg tightening c/w a cramp. It was numb. This has resolved.  She noted a brief moment of  blanking out. She feels better now. Labs in March 2023 showed normal crt, K.  She was not concerned but her niece suggest she be evaluated  Saw Dr. Bronson Ing in 2018. She has hx of normal LV function, mild MR. Noted cath 20 years ago that was normal. Recommendation at that time was for management of sleep apnea.   Her blood pressure is well controlled.  Past Medical History:  Diagnosis Date   Allergy    Anemia    takes Iron daily   Anxiety    Arthritis    Asthma    Symbicort daily and Albuterol daily as needed   B12 deficiency    Back pain    Bilateral swelling of feet    Bruises easily    d/t being on Plavix    Chest pain    Complication of anesthesia    long time to wake up - referred to pulmonary after carpel tunnel procedure - for asthma and undiagnosed sleep apnea   Constipation    Depression    GERD (gastroesophageal reflux disease)    takes Protonix daily   Headache    Heart murmur    [facility] [place] [facility]   Heart murmur    History of blood transfusion    no abnormal reaction noted   History of colon polyps    beningn   History of migraine    couple of wks ago was the last one   Hyperlipidemia    takes Atorvastatin daily   Hypertension    takes Benicar and Diltiazem daily   Hypothyroidism    takes Synthroid daily   Itching    takes Atarax  daily as needed   Joint pain    Joint pain    Joint swelling    Lactose intolerance    Leg swelling    Liver cyst    Mini stroke ([facility])    Multiple allergies    takes Claritin daily as needed as well as using Flonase if needed   Muscle spasm    takes Baclofen if needed   Osteoarthritis    Peripheral edema    takes Torsemide daily   Pneumonia    hx of   Prediabetes    Primary localized osteoarthritis of left knee 06/21/2014   Primary localized osteoarthritis of right knee 03/21/2015   Restrictive lung disease    Sleep apnea    cpap   Sleep apnea    Swallowing difficulty    TIA (transient ischemic attack)    takes Plavix daily;notices right side is slightly weaker than left   Vertigo    takes Meclizine daily as needed   Vitamin D deficiency     Past Surgical History:  Procedure Laterality Date   ABDOMINAL HYSTERECTOMY     BACK  SURGERY     BREAST SURGERY     cyst removal   CARDIAC CATHETERIZATION     carpel tunnel Right    COLONOSCOPY     ESOPHAGOGASTRODUODENOSCOPY     KNEE ARTHROSCOPY     TONSILLECTOMY     TOTAL KNEE ARTHROPLASTY Left 06/21/2014   Procedure: LEFT TOTAL KNEE ARTHROPLASTY;  Surgeon: Johnny Bridge, MD;  Location: [facility];  Service: Orthopedics;  Laterality: Left;   TOTAL KNEE ARTHROPLASTY Right 03/21/2015   Procedure: TOTAL KNEE ARTHROPLASTY STEROID INECTION BOTH KNEES;  Surgeon: Marchia Bond, MD;  Location: [facility];  Service: Orthopedics;  Laterality: Right;   trigger thumb      Current Medications: No outpatient medications have been marked as taking for the 12/04/21 encounter (Appointment) with Janina Mayo, MD.     Allergies:   Bee venom, Diphenhydramine hcl, Diphenhydramine hcl, Doxycycline, Oxycodone-acetaminophen, Penicillins, Percocet [oxycodone-acetaminophen], Propoxyphene, and Sulfa antibiotics   Social History   Socioeconomic History   Marital status: Married    Spouse name: Not on file   Number of children: Not on file   Years of  education: Not on file   Highest education level: Not on file  Occupational History   Occupation: retired  Tobacco Use   Smoking status: Passive Smoke Exposure - Never Smoker   Smokeless tobacco: Never   Tobacco comments:    Father smoked briefly.   Substance and Sexual Activity   Alcohol use: Yes    Comment: social   Drug use: No   Sexual activity: Not on file  Other Topics Concern   Not on file  Social History Narrative   [facility] (08/30/16):   Originally from [place]. 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 No pets currently. No bird, mold, or hot tub exposure. Does have indoor plants. No draperies. Does have carpet in the bedroom. No down bedding that she is aware of.    Social Determinants of Health   Financial Resource Strain: Not on file  Food Insecurity: Not on file  Transportation Needs: Not on file  Physical Activity: Not on file  Stress: Not on file  Social Connections: Not on file     Family History: The patient's family history includes Cancer in her cousin, father, and maternal aunt; Heart disease in her mother and paternal aunt; Hyperlipidemia in her mother; Hypertension in her mother; Lung cancer in her father and paternal uncle; Rheum arthritis in her mother; Scleroderma in her mother; Stroke in her brother. There is no history of Lung disease. Brother died of MI at age 70. He had untreated hypertension, was a smoker, and had a stroke  ROS:   Please see the history of present illness.     All other systems reviewed and are negative.  EKGs/Labs/Other Studies Reviewed:    The following studies were reviewed today:   EKG:  EKG is  ordered today.  The ekg ordered today demonstrates  12/04/2021-NSR, 1st degree AV block,LAD, inferior Q. Poor r wave progression  Recent Labs: 01/03/2021: Magnesium 2.0 10/02/2021: Hemoglobin 10.6; Platelet Count 399  Recent  Lipid Panel    Component Value Date/Time   CHOL 154 05/15/2020 1129   TRIG 49 05/15/2020 1129   HDL 66 05/15/2020 1129   LDLCALC 77 05/15/2020 1129     Risk Assessment/Calculations:           Physical Exam:  VS:   Vitals:   12/04/21 1557  BP: 124/79  Pulse: 71  SpO2: 97%     Wt Readings from Last 3 Encounters:  10/29/21 294 lb 9.6 oz (133.6 kg)  10/18/21 293 lb 9.6 oz (133.2 kg)  10/11/21 292 lb 12.8 oz (132.8 kg)     GEN:  Well nourished, well developed in no acute distress HEENT: Normal NECK: No JVD; No carotid bruits LYMPHATICS: No lymphadenopathy CARDIAC: RRR, no murmurs, rubs, gallops RESPIRATORY:  Clear to auscultation without rales, wheezing or rhonchi  ABDOMEN: Soft, non-tender, non-distended MUSCULOSKELETAL:  No edema; No deformity  SKIN: Warm and dry NEUROLOGIC:  Alert and oriented x 3 PSYCHIATRIC:  Normal affect   ASSESSMENT:    Mild MR: asymptomatic. Last echo was in 2018; can get repeat for surveillance. Can also assess LV function with inferior q waves on ekg. Also had borderline LVIDd in 2018, can FU.  Hx TIA: She noted blanking out, unclear if minor TIA. Will get carotid duplex to ensure she does not have carotid dx that can contribute. Was on DAPT today, will FU [name] and if c/f TIA can consider continuing; otherwise will stop asa ( was meant to continue plavix and statin only)  HTN: continue cardizem 240 mg daily,metoprolol 50 mg XL, olmesartan -HCTz 40-25 mg, hydralazine 25 mg BID. Doing well with this regimen, will continue  PLAN:    In order of problems listed above:  Limited TTE , mitral regurgitation Carotid US BL Follow up in 6 months           Medication Adjustments/Labs and Tests Ordered: Current medicines are reviewed at length with the patient today.  Concerns regarding medicines are outlined above.  No orders of the defined types were placed in this encounter.  No orders of the defined types were placed in this  encounter.   There are no Patient Instructions on file for this visit.   Signed, Janina Mayo, MD  12/04/2021 1:16 PM    [facility]

## 2021-12-04 NOTE — Patient Instructions (Signed)
Medication Instructions:  No Changes In Medications at this time.  *If you need a refill on your cardiac medications before your next appointment, please call your pharmacy*  Lab Work: None Ordered At This Time.  If you have labs (blood work) drawn today and your tests are completely normal, you will receive your results only by: [facility] (if you have MyChart) OR A paper copy in the mail If you have any lab test that is abnormal or we need to change your treatment, we will call you to review the results.  Testing/Procedures: Your physician has requested that you have a carotid duplex. This test is an ultrasound of the carotid arteries in your neck. It looks at blood flow through these arteries that supply the brain with blood. Allow one hour for this exam. There are no restrictions or special instructions.  Your physician has requested that you have an echocardiogram. Echocardiography is a painless test that uses sound waves to create images of your heart. It provides your doctor with information about the size and shape of your heart and how well your heart's chambers and valves are working. You may receive an ultrasound enhancing agent through an IV if needed to better visualize your heart during the echo.This procedure takes approximately one hour. There are no restrictions for this procedure. This will take place at the [address], [address].   Follow-Up: At [facility], you and your health needs are our priority.  As part of our continuing mission to provide you with exceptional heart care, we have created designated Provider Care Teams.  These Care Teams include your primary Cardiologist (physician) and Advanced Practice Providers (APPs -  Physician Assistants and Nurse Practitioners) who all work together to provide you with the care you need, when you need it.  Your next appointment:   6 month(s)  The format for your next appointment:   In Person  Provider:    Janina Mayo, MD

## 2021-12-06 ENCOUNTER — Telehealth: Payer: Self-pay

## 2021-12-06 ENCOUNTER — Other Ambulatory Visit: Payer: Self-pay | Admitting: Pulmonary Disease

## 2021-12-06 NOTE — Telephone Encounter (Addendum)
ATC patient to schedule an appt since patient is overdue for a f/u and has not been seen in over a year. Needs symbicort refill. Patient goes to [facility] office.

## 2021-12-26 ENCOUNTER — Other Ambulatory Visit: Payer: Self-pay | Admitting: Internal Medicine

## 2021-12-26 ENCOUNTER — Ambulatory Visit (INDEPENDENT_AMBULATORY_CARE_PROVIDER_SITE_OTHER): Payer: Medicare Other | Admitting: *Deleted

## 2021-12-26 ENCOUNTER — Ambulatory Visit (HOSPITAL_COMMUNITY): Payer: Medicare Other | Attending: Cardiology

## 2021-12-26 VITALS — BP 144/82 | HR 63 | Temp 98.0°F | Resp 18 | Ht 65.0 in | Wt 290.2 lb

## 2021-12-26 DIAGNOSIS — J4550 Severe persistent asthma, uncomplicated: Secondary | ICD-10-CM

## 2021-12-26 DIAGNOSIS — R2990 Unspecified symptoms and signs involving the nervous system: Secondary | ICD-10-CM | POA: Diagnosis not present

## 2021-12-26 DIAGNOSIS — I340 Nonrheumatic mitral (valve) insufficiency: Secondary | ICD-10-CM | POA: Insufficient documentation

## 2021-12-26 LAB — ECHOCARDIOGRAM COMPLETE
AR max vel: 3.99 cm2
AV Area VTI: 4.13 cm2
AV Area mean vel: 4.08 cm2
AV Mean grad: 6 mmHg
AV Peak grad: 11.2 mmHg
Ao pk vel: 1.68 m/s
Area-P 1/2: 3.32 cm2
Height: 65 in
MV M vel: 5.24 m/s
MV Peak grad: 109.8 mmHg
S' Lateral: 3.1 cm
Weight: 4643.2 oz

## 2021-12-26 MED ORDER — BENRALIZUMAB 30 MG/ML ~~LOC~~ SOSY
30.0000 mg | PREFILLED_SYRINGE | Freq: Once | SUBCUTANEOUS | Status: AC
  Administered 2021-12-26: 30 mg via SUBCUTANEOUS

## 2021-12-26 NOTE — Progress Notes (Signed)
Diagnosis: Asthma  Provider:  Marshell Garfinkel, MD  Procedure: Injection  Fasenra (Benralizumab), Dose: 30 mg, Site: subcutaneous, Number of injections: 1  Discharge: Condition: Good, Destination: Home . AVS provided to patient.   Performed by:  Oren Beckmann, RN

## 2022-01-01 ENCOUNTER — Inpatient Hospital Stay (HOSPITAL_BASED_OUTPATIENT_CLINIC_OR_DEPARTMENT_OTHER): Payer: Medicare Other | Admitting: Internal Medicine

## 2022-01-01 ENCOUNTER — Ambulatory Visit (HOSPITAL_BASED_OUTPATIENT_CLINIC_OR_DEPARTMENT_OTHER)
Admission: RE | Admit: 2022-01-01 | Discharge: 2022-01-01 | Disposition: A | Discharge status: Home / Self Care | Payer: Medicare Other | Source: Ambulatory Visit | Attending: Internal Medicine | Admitting: Internal Medicine

## 2022-01-01 ENCOUNTER — Inpatient Hospital Stay: Payer: Medicare Other | Attending: Internal Medicine

## 2022-01-01 ENCOUNTER — Other Ambulatory Visit: Payer: Self-pay

## 2022-01-01 VITALS — BP 127/85 | HR 74 | Temp 98.3°F | Resp 19 | Wt 290.2 lb

## 2022-01-01 DIAGNOSIS — D508 Other iron deficiency anemias: Secondary | ICD-10-CM

## 2022-01-01 DIAGNOSIS — R2990 Unspecified symptoms and signs involving the nervous system: Secondary | ICD-10-CM | POA: Insufficient documentation

## 2022-01-01 DIAGNOSIS — K909 Intestinal malabsorption, unspecified: Secondary | ICD-10-CM | POA: Insufficient documentation

## 2022-01-01 DIAGNOSIS — Z7902 Long term (current) use of antithrombotics/antiplatelets: Secondary | ICD-10-CM | POA: Insufficient documentation

## 2022-01-01 DIAGNOSIS — D509 Iron deficiency anemia, unspecified: Secondary | ICD-10-CM

## 2022-01-01 DIAGNOSIS — Z7951 Long term (current) use of inhaled steroids: Secondary | ICD-10-CM | POA: Insufficient documentation

## 2022-01-01 DIAGNOSIS — I340 Nonrheumatic mitral (valve) insufficiency: Secondary | ICD-10-CM | POA: Insufficient documentation

## 2022-01-01 DIAGNOSIS — Z79899 Other long term (current) drug therapy: Secondary | ICD-10-CM | POA: Insufficient documentation

## 2022-01-01 DIAGNOSIS — Z8673 Personal history of transient ischemic attack (TIA), and cerebral infarction without residual deficits: Secondary | ICD-10-CM | POA: Diagnosis not present

## 2022-01-01 LAB — CBC WITH DIFFERENTIAL (CANCER CENTER ONLY)
Abs Immature Granulocytes: 0.01 10*3/uL (ref 0.00–0.07)
Basophils Absolute: 0.1 10*3/uL (ref 0.0–0.1)
Basophils Relative: 1 %
Eosinophils Absolute: 0 10*3/uL (ref 0.0–0.5)
Eosinophils Relative: 0 %
HCT: 35.6 % — ABNORMAL LOW (ref 36.0–46.0)
Hemoglobin: 12.3 g/dL (ref 12.0–15.0)
Immature Granulocytes: 0 %
Lymphocytes Relative: 38 %
Lymphs Abs: 2.8 10*3/uL (ref 0.7–4.0)
MCH: 30.7 pg (ref 26.0–34.0)
MCHC: 34.6 g/dL (ref 30.0–36.0)
MCV: 88.8 fL (ref 80.0–100.0)
Monocytes Absolute: 0.8 10*3/uL (ref 0.1–1.0)
Monocytes Relative: 10 %
Neutro Abs: 3.6 10*3/uL (ref 1.7–7.7)
Neutrophils Relative %: 51 %
Platelet Count: 336 10*3/uL (ref 150–400)
RBC: 4.01 MIL/uL (ref 3.87–5.11)
RDW: 17.2 % — ABNORMAL HIGH (ref 11.5–15.5)
WBC Count: 7.2 10*3/uL (ref 4.0–10.5)
nRBC: 0 % (ref 0.0–0.2)

## 2022-01-01 LAB — IRON AND IRON BINDING CAPACITY (CC-WL,HP ONLY)
Iron: 62 ug/dL (ref 28–170)
Saturation Ratios: 16 % (ref 10.4–31.8)
TIBC: 382 ug/dL (ref 250–450)
UIBC: 320 ug/dL (ref 148–442)

## 2022-01-01 LAB — FERRITIN: Ferritin: 27 ng/mL (ref 11–307)

## 2022-01-01 NOTE — Progress Notes (Signed)
[facility] [phone]   [phone]  OFFICE PROGRESS NOTE  [address], [place] [address]  DIAGNOSIS: Persistent iron deficiency anemia with no clear etiology except for possibility of dietary insufficiency and the patient did not have any improvement in her anemia with the oral iron tablet  PRIOR THERAPY: Iron infusion with Venofer 300 mg IV weekly for 3 weeks.  CURRENT THERAPY: Vitron-C 1 tablet p.o. daily.  INTERVAL HISTORY: Tamara Daugherty 71 y.o. female returns to the clinic today for follow-up visit.  The patient is feeling fine today with no concerning complaints.  She denied having any current chest pain, shortness of breath With exertion with no cough or hemoptysis.  She continues to have mild fatigue.  She has no weight loss or night sweats.  She was recently diagnosed with mitral regurgitation and she is followed by cardiology.  She denied having any fever or chills.  She has no nausea, vomiting, diarrhea or constipation.  She continues to tolerate her oral iron tablet fairly well.  The patient is here today for evaluation and repeat blood work.  MEDICAL HISTORY: Past Medical History:  Diagnosis Date   Allergy    Anemia    takes Iron daily   Anxiety    Arthritis    Asthma    Symbicort daily and Albuterol daily as needed   B12 deficiency    Back pain    Bilateral swelling of feet    Bruises easily    d/t being on Plavix    Chest pain    Complication of anesthesia    long time to wake up - referred to pulmonary after carpel tunnel procedure - for asthma and undiagnosed sleep apnea   Constipation    Depression    GERD (gastroesophageal reflux disease)    takes Protonix daily   Headache    Heart murmur    [facility]   Heart murmur    History of blood transfusion    no abnormal reaction noted   History of colon polyps    beningn   History of migraine    couple of wks ago was the  last one   Hyperlipidemia    takes Atorvastatin daily   Hypertension    takes Benicar and Diltiazem daily   Hypothyroidism    takes Synthroid daily   Itching    takes Atarax daily as needed   Joint pain    Joint pain    Joint swelling    Lactose intolerance    Leg swelling    Liver cyst    Mini stroke    Multiple allergies    takes Claritin daily as needed as well as using Flonase if needed   Muscle spasm    takes Baclofen if needed   Osteoarthritis    Peripheral edema    takes Torsemide daily   Pneumonia    hx of   Prediabetes    Primary localized osteoarthritis of left knee 06/21/2014   Primary localized osteoarthritis of right knee 03/21/2015   Restrictive lung disease    Sleep apnea    cpap   Sleep apnea    Swallowing difficulty    TIA (transient ischemic attack)    takes Plavix daily;notices right side is slightly weaker than left   Vertigo    takes Meclizine daily as needed   Vitamin D deficiency     ALLERGIES:  is allergic to  bee venom, diphenhydramine hcl, diphenhydramine hcl, doxycycline, oxycodone-acetaminophen, penicillins, percocet [oxycodone-acetaminophen], propoxyphene, and sulfa antibiotics.  MEDICATIONS:  Current Outpatient Medications  Medication Sig Dispense Refill   albuterol (VENTOLIN HFA) 108 (90 Base) MCG/ACT inhaler Inhale 2 puffs into the lungs every 6 (six) hours as needed for wheezing or shortness of breath. 18 g 3   aspirin EC 81 MG tablet Take by mouth.     atorvastatin (LIPITOR) 10 MG tablet Take 10 mg by mouth daily.     budesonide (PULMICORT) 0.5 MG/2ML nebulizer solution USE 1 VIAL  IN  NEBULIZER TWICE  DAILY - At 10 AM And 5 PM - Rinse Mouth After Treatment 120 mL 11   budesonide-formoterol (SYMBICORT) 160-4.5 MCG/ACT inhaler Inhale 2 puffs into the lungs in the morning and at bedtime. 1 each 12   clopidogrel (PLAVIX) 75 MG tablet Take 1 tablet by mouth daily.     diltiazem (CARDIZEM CD) 240 MG 24 hr capsule Take 240 mg by mouth  daily.     EPINEPHrine 0.3 mg/0.3 mL IJ SOAJ injection Inject 0.3 mg into the muscle once.     fluticasone (FLONASE) 50 MCG/ACT nasal spray Place 1 spray into both nostrils 2 (two) times daily as needed for allergies or rhinitis.     formoterol (PERFOROMIST) 20 MCG/2ML nebulizer solution USE 1 VIAL  IN  NEBULIZER TWICE  DAILY - - Morning And Evening 120 mL 11   guaiFENesin (MUCINEX) 600 MG 12 hr tablet Take 600 mg by mouth 2 (two) times daily as needed for to loosen phlegm.     hydrALAZINE (APRESOLINE) 25 MG tablet TAKE 1 TABLET BY MOUTH EVERY DAY IN THE MORNING AND AT BEDTIME     ipratropium-albuterol (DUONEB) 0.5-2.5 (3) MG/3ML SOLN Inhale into the lungs.     Iron-Vitamin C (VITRON-C) 65-125 MG TABS Take by mouth.     levothyroxine (SYNTHROID, LEVOTHROID) 50 MCG tablet Take 50 mcg by mouth daily before breakfast.     loratadine (CLARITIN) 10 MG tablet Take 10 mg by mouth daily as needed for allergies.     metoprolol succinate (TOPROL-XL) 50 MG 24 hr tablet Take 50 mg by mouth daily. Take with or immediately following a meal.     montelukast (SINGULAIR) 10 MG tablet Take 1 tablet (10 mg total) by mouth at bedtime. 90 tablet 3   Multiple Vitamins-Minerals (MULTIVITAMIN WITH MINERALS) tablet Take 1 tablet by mouth daily.     olmesartan-hydrochlorothiazide (BENICAR HCT) 40-25 MG per tablet Take 1 tablet by mouth daily.     Omega-3 Fatty Acids (FISH OIL PO) Take 1 capsule by mouth 2 (two) times daily.     pantoprazole (PROTONIX) 40 MG tablet Take 40 mg by mouth daily.     potassium chloride (MICRO-K) 10 MEQ CR capsule Take 30 mEq by mouth daily.      pyridOXINE (B-6) 50 MG tablet Take 50 mg by mouth daily.     Spacer/Aero-Holding Chambers (AEROCHAMBER MV) inhaler Use as instructed 1 each 0   torsemide (DEMADEX) 20 MG tablet Take 1 tablet (20 mg total) by mouth as needed. 30 tablet 1   TURMERIC PO Take 1 capsule by mouth daily.     Vitamin D, Ergocalciferol, (DRISDOL) 1.25 MG (50000 UNIT) CAPS  capsule Take 1 capsule (50,000 Units total) by mouth every 7 (seven) days. 4 capsule 0   No current facility-administered medications for this visit.    SURGICAL HISTORY:  Past Surgical History:  Procedure Laterality Date   ABDOMINAL HYSTERECTOMY  BACK SURGERY     BREAST SURGERY     cyst removal   CARDIAC CATHETERIZATION     carpel tunnel Right    COLONOSCOPY     ESOPHAGOGASTRODUODENOSCOPY     KNEE ARTHROSCOPY     TONSILLECTOMY     TOTAL KNEE ARTHROPLASTY Left 06/21/2014   Procedure: LEFT TOTAL KNEE ARTHROPLASTY;  Surgeon: Johnny Bridge, MD;  Location: [facility];  Service: Orthopedics;  Laterality: Left;   TOTAL KNEE ARTHROPLASTY Right 03/21/2015   Procedure: TOTAL KNEE ARTHROPLASTY STEROID INECTION BOTH KNEES;  Surgeon: Marchia Bond, MD;  Location: [facility];  Service: Orthopedics;  Laterality: Right;   trigger thumb      REVIEW OF SYSTEMS:  A comprehensive review of systems was negative except for: Constitutional: positive for fatigue Respiratory: positive for dyspnea on exertion Musculoskeletal: positive for arthralgias   PHYSICAL EXAMINATION: General appearance: alert, cooperative, fatigued, and no distress Head: Normocephalic, without obvious abnormality, atraumatic Neck: no adenopathy, no JVD, supple, symmetrical, trachea midline, and thyroid not enlarged, symmetric, no tenderness/mass/nodules Lymph nodes: Cervical, supraclavicular, and axillary nodes normal. Resp: clear to auscultation bilaterally Back: symmetric, no curvature. ROM normal. No CVA tenderness. Cardio: regular rate and rhythm, S1, S2 normal, no murmur, click, rub or gallop GI: soft, non-tender; bowel sounds normal; no masses,  no organomegaly Extremities: extremities normal, atraumatic, no cyanosis or edema  ECOG PERFORMANCE STATUS: 1 - Symptomatic but completely ambulatory  Blood pressure 127/85, pulse 74, temperature 98.3 F (36.8 C), temperature source Oral, resp. rate 19, weight 290 lb 4 oz (131.7 kg),  SpO2 96 %.  LABORATORY DATA: Lab Results  Component Value Date   WBC 6.1 10/02/2021   HGB 10.6 (L) 10/02/2021   HCT 32.0 (L) 10/02/2021   MCV 84.9 10/02/2021   PLT 399 10/02/2021      Chemistry      Component Value Date/Time   NA 137 11/01/2020 1115   NA 135 05/15/2020 1129   K 3.6 11/01/2020 1115   CL 96 (L) 11/01/2020 1115   CO2 35 (H) 11/01/2020 1115   BUN 18 11/01/2020 1115   BUN 13 05/15/2020 1129   CREATININE 1.09 (H) 11/01/2020 1115      Component Value Date/Time   CALCIUM 9.2 11/01/2020 1115   ALKPHOS 45 11/01/2020 1115   AST 14 (L) 11/01/2020 1115   ALT 12 11/01/2020 1115   BILITOT 0.3 11/01/2020 1115       RADIOGRAPHIC STUDIES: ECHOCARDIOGRAM COMPLETE  Result Date: 12/26/2021    ECHOCARDIOGRAM LIMITED REPORT   Patient Name:   Tamara Daugherty Date of Exam: 12/26/2021 Medical Rec #:  [id number]     Height:       65.0 in Accession #:    [id number]    Weight:       290.2 lb Date of Birth:  1951-03-29      BSA:          2.317 m Patient Age:    57 years      BP:           124/70 mmHg Patient Gender: F             HR:           60 bpm. Exam Location:  [facility] Procedure: 2D Echo, 3D Echo, Cardiac Doppler and Color Doppler Indications:    I34.0 Mitral Valve Insufficiency  History:        Patient has prior history of Echocardiogram examinations, most  recent 01/16/2017. TIA, Signs/Symptoms:Chest Pain, Shortness of                 Breath, Murmur and Edema; Risk Factors:Hypertension,                 Dyslipidemia, Family History of Coronary Artery Disease and                 Sleep Apnea. Restrictive Lung Disease, Asthma, Obesity.  Sonographer:    Deliah Boston RDCS Referring Phys: Janina Mayo IMPRESSIONS  1. Left ventricular ejection fraction, by estimation, is 55%. The left ventricle has no regional wall motion abnormalities. Left ventricular diastolic parameters are consistent with Grade I diastolic dysfunction (impaired relaxation).  2. Right ventricular  systolic function is normal. The right ventricular size is normal. There is normal pulmonary artery systolic pressure. The estimated right ventricular systolic pressure is 03.4 mmHg.  3. Left atrial size was moderately dilated.  4. Right atrial size was mildly dilated.  5. The mitral valve is abnormal. Moderate mitral valve regurgitation (not well-visualized). No evidence of mitral stenosis.  6. The aortic valve is tricuspid. There is mild calcification of the aortic valve. Aortic valve regurgitation is not visualized. No aortic stenosis is present.  7. The inferior vena cava is dilated in size with >50% respiratory variability, suggesting right atrial pressure of 8 mmHg. FINDINGS  Left Ventricle: Left ventricular ejection fraction, by estimation, is 55%. The left ventricle has no regional wall motion abnormalities. The left ventricular internal cavity size was normal in size. There is no left ventricular hypertrophy. Left ventricular diastolic parameters are consistent with Grade I diastolic dysfunction (impaired relaxation). Right Ventricle: The right ventricular size is normal. No increase in right ventricular wall thickness. Right ventricular systolic function is normal. There is normal pulmonary artery systolic pressure. The tricuspid regurgitant velocity is 2.60 m/s, and  with an assumed right atrial pressure of 8 mmHg, the estimated right ventricular systolic pressure is 74.2 mmHg. Left Atrium: Left atrial size was moderately dilated. Right Atrium: Right atrial size was mildly dilated. Mitral Valve: The mitral valve is abnormal. Moderate mitral valve regurgitation. No evidence of mitral valve stenosis. Tricuspid Valve: The tricuspid valve is normal in structure. Tricuspid valve regurgitation is trivial. Aortic Valve: The aortic valve is tricuspid. There is mild calcification of the aortic valve. Aortic valve regurgitation is not visualized. No aortic stenosis is present. Aortic valve mean gradient measures  6.0 mmHg. Aortic valve peak gradient measures 11.2 mmHg. Aortic valve area, by VTI measures 4.13 cm. Pulmonic Valve: The pulmonic valve was normal in structure. Pulmonic valve regurgitation is trivial. Aorta: The aortic root is normal in size and structure. Venous: The inferior vena cava is dilated in size with greater than 50% respiratory variability, suggesting right atrial pressure of 8 mmHg. IAS/Shunts: No atrial level shunt detected by color flow Doppler. LEFT VENTRICLE PLAX 2D LVIDd:         5.20 cm   Diastology LVIDs:         3.10 cm   LV e' medial:    7.83 cm/s LV PW:         0.90 cm   LV E/e' medial:  11.9 LV IVS:        1.20 cm   LV e' lateral:   11.10 cm/s LVOT diam:     2.80 cm   LV E/e' lateral: 8.4 LV SV:         167 LV SV Index:   72  LVOT Area:     6.16 cm                           3D Volume EF:                          3D EF:        66 %                          LV EDV:       214 ml                          LV ESV:       72 ml                          LV SV:        142 ml RIGHT VENTRICLE RV Basal diam:  4.40 cm RV Mid diam:    2.80 cm RV S prime:     16.00 cm/s TAPSE (M-mode): 3.4 cm LEFT ATRIUM            Index        RIGHT ATRIUM           Index LA diam:      3.40 cm  1.47 cm/m   RA Area:     20.30 cm LA Vol (A2C): 79.1 ml  34.14 ml/m  RA Volume:   64.40 ml  27.79 ml/m LA Vol (A4C): 117.0 ml 50.49 ml/m  AORTIC VALVE AV Area (Vmax):    3.99 cm AV Area (Vmean):   4.08 cm AV Area (VTI):     4.13 cm AV Vmax:           167.50 cm/s AV Vmean:          111.000 cm/s AV VTI:            0.406 m AV Peak Grad:      11.2 mmHg AV Mean Grad:      6.0 mmHg LVOT Vmax:         108.50 cm/s LVOT Vmean:        73.600 cm/s LVOT VTI:          0.272 m LVOT/AV VTI ratio: 0.67  AORTA Ao Root diam: 3.40 cm Ao Asc diam:  2.85 cm MITRAL VALVE                TRICUSPID VALVE MV Area (PHT): cm          TR Peak grad:   27.0 mmHg MV Decel Time: 229 msec     TR Vmax:        260.00 cm/s MR Peak grad: 109.8 mmHg MR Mean  grad: 73.0 mmHg     SHUNTS MR Vmax:      524.00 cm/s   Systemic VTI:  0.27 m MR Vmean:     399.0 cm/s    Systemic Diam: 2.80 cm MV E velocity: 93.33 cm/s MV A velocity: 113.33 cm/s MV E/A ratio:  0.82 Dalton McleanMD Electronically signed by Franki Monte Signature Date/Time: 12/26/2021/9:49:23 PM    Final     ASSESSMENT AND PLAN: This is a very pleasant 71 years old African-American female with iron deficiency anemia secondary to malabsorption and lack of benefit to the oral iron tablets.  The patient was treated with iron infusion with Venofer 300 mg IV weekly for 3 weeks and tolerated her infusion fairly well. She continues on the oral iron tablet with Vitron-C 1 tablet daily and continues to tolerate it well. The patient has been tolerating this treatment with no concerning complaints. Repeat CBC showed improvement of her hemoglobin up to 12.3. I recommended for the patient to continue with the oral iron tablet for now. I will see her back for follow-up visit in 6 months for evaluation and repeat blood work. The patient was advised to call immediately if she has any concerning symptoms in the interval. The patient voices understanding of current disease status and treatment options and is in agreement with the current care plan.  All questions were answered. The patient knows to call the clinic with any problems, questions or concerns. We can certainly see the patient much sooner if necessary.   Disclaimer: This note was dictated with voice recognition software. Similar sounding words can inadvertently be transcribed and may not be corrected upon review.

## 2022-01-02 ENCOUNTER — Encounter: Payer: Self-pay | Admitting: Internal Medicine

## 2022-01-03 ENCOUNTER — Other Ambulatory Visit: Payer: Self-pay

## 2022-01-03 ENCOUNTER — Other Ambulatory Visit: Payer: Self-pay | Admitting: Internal Medicine

## 2022-01-03 DIAGNOSIS — E041 Nontoxic single thyroid nodule: Secondary | ICD-10-CM

## 2022-01-03 NOTE — Telephone Encounter (Signed)
Per carotid doppler report:   Incidental findings: Well-circumscribed inhomogeneous nodule with  increase vascularity noted in the mid/inferior lobe of the right thyroid.  Recommend follow up soft tissue thyroid ultrasound.

## 2022-01-10 ENCOUNTER — Ambulatory Visit (HOSPITAL_COMMUNITY)
Admission: RE | Admit: 2022-01-10 | Discharge: 2022-01-10 | Disposition: A | Discharge status: Home / Self Care | Payer: Medicare Other | Source: Ambulatory Visit | Attending: Internal Medicine | Admitting: Internal Medicine

## 2022-01-10 DIAGNOSIS — E041 Nontoxic single thyroid nodule: Secondary | ICD-10-CM | POA: Diagnosis not present

## 2022-01-30 ENCOUNTER — Encounter (INDEPENDENT_AMBULATORY_CARE_PROVIDER_SITE_OTHER): Payer: Self-pay

## 2022-02-04 ENCOUNTER — Other Ambulatory Visit: Payer: Self-pay | Admitting: Pulmonary Disease

## 2022-02-08 ENCOUNTER — Encounter: Payer: Self-pay | Admitting: Nurse Practitioner

## 2022-02-08 ENCOUNTER — Ambulatory Visit (INDEPENDENT_AMBULATORY_CARE_PROVIDER_SITE_OTHER): Payer: Medicare Other | Admitting: Nurse Practitioner

## 2022-02-08 VITALS — BP 133/77 | HR 65 | Ht 65.0 in | Wt 288.0 lb

## 2022-02-08 DIAGNOSIS — E041 Nontoxic single thyroid nodule: Secondary | ICD-10-CM

## 2022-02-08 NOTE — Progress Notes (Signed)
Endocrinology Consult Note 02/08/22    ---------------------------------------------------------------------------------------------------------------------- Subjective    Past Medical History:  Diagnosis Date   Allergy    Anemia    takes Iron daily   Anxiety    Arthritis    Asthma    Symbicort daily and Albuterol daily as needed   B12 deficiency    Back pain    Bilateral swelling of feet    Bruises easily    d/t being on Plavix    Chest pain    Complication of anesthesia    long time to wake up - referred to pulmonary after carpel tunnel procedure - for asthma and undiagnosed sleep apnea   Constipation    Depression    GERD (gastroesophageal reflux disease)    takes Protonix daily   Headache    Heart murmur    [facility]   Heart murmur    History of blood transfusion    no abnormal reaction noted   History of colon polyps    beningn   History of migraine    couple of wks ago was the last one   Hyperlipidemia    takes Atorvastatin daily   Hypertension    takes Benicar and Diltiazem daily   Hypothyroidism    takes Synthroid daily   Itching    takes Atarax daily as needed   Joint pain    Joint pain    Joint swelling    Lactose intolerance    Leg swelling    Liver cyst    Mini stroke    Multiple allergies    takes Claritin daily as needed as well as using Flonase if needed   Muscle spasm    takes Baclofen if needed   Osteoarthritis    Peripheral edema    takes Torsemide daily   Pneumonia    hx of   Prediabetes    Primary localized osteoarthritis of left knee 06/21/2014   Primary localized osteoarthritis of right knee 03/21/2015   Restrictive lung disease    Sleep apnea    cpap   Sleep apnea    Swallowing difficulty    TIA (transient ischemic attack)    takes Plavix daily;notices right side is slightly weaker than left   Vertigo    takes Meclizine daily as needed   Vitamin D deficiency     Past Surgical History:  Procedure  Laterality Date   ABDOMINAL HYSTERECTOMY     BACK SURGERY     BREAST SURGERY     cyst removal   CARDIAC CATHETERIZATION     carpel tunnel Right    COLONOSCOPY     ESOPHAGOGASTRODUODENOSCOPY     KNEE ARTHROSCOPY     TONSILLECTOMY     TOTAL KNEE ARTHROPLASTY Left 06/21/2014   Procedure: LEFT TOTAL KNEE ARTHROPLASTY;  Surgeon: Johnny Bridge, MD;  Location: [facility];  Service: Orthopedics;  Laterality: Left;   TOTAL KNEE ARTHROPLASTY Right 03/21/2015   Procedure: TOTAL KNEE ARTHROPLASTY STEROID INECTION BOTH KNEES;  Surgeon: Marchia Bond, MD;  Location: [facility];  Service: Orthopedics;  Laterality: Right;   trigger thumb      Social History   Socioeconomic History   Marital status: Married    Spouse name: Not on file   Number of children: Not on file   Years of education: Not on file   Highest education level: Not on file  Occupational History   Occupation: retired  Tobacco Use   Smoking status: Never  Passive exposure: Yes   Smokeless tobacco: Never   Tobacco comments:    Father smoked briefly.   Vaping Use   Vaping Use: Never used  Substance and Sexual Activity   Alcohol use: Yes    Comment: social   Drug use: No   Sexual activity: Not on file  Other Topics Concern   Not on file  Social History Narrative   [facility] (08/30/16):   Originally from [place]. 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 No pets currently. No bird, mold, or hot tub exposure. Does have indoor plants. No draperies. Does have carpet in the bedroom. No down bedding that she is aware of.    Social Determinants of Health   Financial Resource Strain: Not on file  Food Insecurity: Not on file  Transportation Needs: Not on file  Physical Activity: Not on file  Stress: Not on file  Social Connections: Not on file  Intimate Partner Violence: Not on file    Current Outpatient Medications on File Prior to  Visit  Medication Sig Dispense Refill   albuterol (VENTOLIN HFA) 108 (90 Base) MCG/ACT inhaler Inhale 2 puffs into the lungs every 6 (six) hours as needed for wheezing or shortness of breath. 18 g 3   atorvastatin (LIPITOR) 10 MG tablet Take 10 mg by mouth daily.     budesonide (PULMICORT) 0.5 MG/2ML nebulizer solution USE 1 VIAL  IN  NEBULIZER TWICE  DAILY - At 10 AM And 5 PM - Rinse Mouth After Treatment 120 mL 11   budesonide-formoterol (SYMBICORT) 160-4.5 MCG/ACT inhaler Inhale 2 puffs into the lungs in the morning and at bedtime. 1 each 12   clopidogrel (PLAVIX) 75 MG tablet Take 1 tablet by mouth daily.     diltiazem (CARDIZEM CD) 240 MG 24 hr capsule Take 240 mg by mouth daily.     EPINEPHrine 0.3 mg/0.3 mL IJ SOAJ injection Inject 0.3 mg into the muscle once.     fluticasone (FLONASE) 50 MCG/ACT nasal spray Place 1 spray into both nostrils 2 (two) times daily as needed for allergies or rhinitis.     formoterol (PERFOROMIST) 20 MCG/2ML nebulizer solution USE 1 VIAL  IN  NEBULIZER TWICE  DAILY - - Morning And Evening 120 mL 11   guaiFENesin (MUCINEX) 600 MG 12 hr tablet Take 600 mg by mouth 2 (two) times daily as needed for to loosen phlegm.     hydrALAZINE (APRESOLINE) 25 MG tablet TAKE 1 TABLET BY MOUTH EVERY DAY IN THE MORNING AND AT BEDTIME     ipratropium-albuterol (DUONEB) 0.5-2.5 (3) MG/3ML SOLN Inhale into the lungs.     Iron-Vitamin C (VITRON-C) 65-125 MG TABS Take by mouth.     levothyroxine (SYNTHROID, LEVOTHROID) 50 MCG tablet Take 50 mcg by mouth daily before breakfast.     loratadine (CLARITIN) 10 MG tablet Take 10 mg by mouth daily as needed for allergies.     metoprolol succinate (TOPROL-XL) 50 MG 24 hr tablet Take 50 mg by mouth daily. Take with or immediately following a meal.     montelukast (SINGULAIR) 10 MG tablet Take 1 tablet (10 mg total) by mouth at bedtime. 90 tablet 3   Multiple Vitamins-Minerals (MULTIVITAMIN WITH MINERALS) tablet Take 1 tablet by mouth daily.      olmesartan-hydrochlorothiazide (BENICAR HCT) 40-25 MG per tablet Take 1 tablet by mouth daily.     Omega-3 Fatty Acids (  FISH OIL PO) Take 1 capsule by mouth 2 (two) times daily.     pantoprazole (PROTONIX) 40 MG tablet Take 40 mg by mouth daily.     potassium chloride (MICRO-K) 10 MEQ CR capsule Take 30 mEq by mouth daily.      pyridOXINE (B-6) 50 MG tablet Take 50 mg by mouth daily.     Spacer/Aero-Holding Chambers (AEROCHAMBER MV) inhaler Use as instructed 1 each 0   torsemide (DEMADEX) 20 MG tablet Take 1 tablet (20 mg total) by mouth as needed. 30 tablet 1   TURMERIC PO Take 1 capsule by mouth daily.     Vitamin D, Ergocalciferol, (DRISDOL) 1.25 MG (50000 UNIT) CAPS capsule Take 1 capsule (50,000 Units total) by mouth every 7 (seven) days. 4 capsule 0   No current facility-administered medications on file prior to visit.      HPI   Tamara Daugherty is a 71 y.o.-year-old female, referred by her PCP, Dr.Paul Brynda Greathouse, for evaluation for multinodular goiter.  She had carotid doppler and a thyroid nodule was incidentally found, therefore her PCP ordered dedicated thyroid ultrasound to evaluate.  Thyroid U/S: 01/10/22 CLINICAL DATA:  Thyroid nodule   EXAM: THYROID ULTRASOUND   TECHNIQUE: Ultrasound examination of the thyroid gland and adjacent soft tissues was performed.   COMPARISON:  None available   FINDINGS: Parenchymal Echotexture: Mildly heterogenous   Isthmus: 0.4 cm   Right lobe: 4.7 x 1.4 x 2.5 cm   Left lobe: 4.6 x 1.4 x 1.7 cm   _________________________________________________________   Estimated total number of nodules >/= 1 cm: 2   Number of spongiform nodules >/=  2 cm not described below (TR1): 0   Number of mixed cystic and solid nodules >/= 1.5 cm not described below (TR2): 0   _________________________________________________________   Nodule # 1:   Location: Right; superior   Maximum size: 2.2 cm; Other 2 dimensions: 1.0 x 1.9 cm   Composition:  solid/almost completely solid (2)   Echogenicity: hypoechoic (2)   Shape: not taller-than-wide (0)   Margins: smooth (0)   Echogenic foci: none (0)   ACR TI-RADS total points: 4.   ACR TI-RADS risk category: TR4 (4-6 points).   ACR TI-RADS recommendations:   **Given size (>/= 1.5 cm) and appearance, fine needle aspiration of this moderately suspicious nodule should be considered based on TI-RADS criteria.   _________________________________________________________   Nodule 2: 1.5 cm cystic right mid thyroid nodule does not meet criteria for imaging surveillance or FNA.   IMPRESSION: Nodule 1 (TI-RADS 4), measuring 2.2 cm, located in the superior right thyroid lobe, meets criteria for FNA.   The above is in keeping with the ACR TI-RADS recommendations - J Am Coll Radiol 2017;14:587-595.     Electronically Signed   By: Miachel Roux M.D.   On: 01/10/2022 14:49    I reviewed pt's thyroid tests: Lab Results  Component Value Date   TSH 1.06 09/07/2021   TSH 1.610 12/01/2019     She does complain of intermittent difficulty swallowing (especially pills) and sometimes gets choked.  She denies any difficulty breathing while laying down, hoarseness, nodules in neck.  She does have family history of hypothyroidism in a cousin and personal history of hypothyroidism (on replacement therapy). No FH of thyroid cancer. No h/o radiation tx to head or neck.  No seaweed or kelp. No recent contrast studies. No steroid use. No herbal supplements. No Biotin supplements or Hair, Skin and Nails vitamins.  Pt also  has a history of anemia, prediabetes, heart murmur, GERD, TIA (on Plavix), hypothyroidism (on replacement), and OSA.  Review of systems  Constitutional: + Minimally fluctuating body weight,  current Body mass index is 47.93 kg/m. , no fatigue, no subjective hyperthermia, no subjective hypothermia Eyes: no blurry vision, no xerophthalmia ENT: no sore throat, no nodules  palpated in throat, + intermittent dysphagia/odynophagia, no hoarseness Cardiovascular: no chest pain, no shortness of breath, no palpitations, no leg swelling Respiratory: no cough, no shortness of breath Gastrointestinal: no nausea/vomiting/diarrhea Musculoskeletal: no muscle/joint aches Skin: no rashes, no hyperemia Neurological: no tremors, no numbness, no tingling, no dizziness Psychiatric: no depression, no anxiety  ---------------------------------------------------------------------------------------------------------------------- Objective    BP 133/77   Pulse 65   Ht '5\' 5"'$  (1.651 m)   Wt 288 lb (130.6 kg)   BMI 47.93 kg/m    BP Readings from Last 3 Encounters:  02/08/22 133/77  01/01/22 127/85  12/26/21 (!) 144/82    Wt Readings from Last 3 Encounters:  02/08/22 288 lb (130.6 kg)  01/01/22 290 lb 4 oz (131.7 kg)  12/26/21 290 lb 3.2 oz (131.6 kg)    Physical Exam- Limited  Constitutional:  Body mass index is 47.93 kg/m. , not in acute distress, normal state of mind Eyes:  EOMI, no exophthalmos Neck: Supple Thyroid: No gross goiter Cardiovascular: RRR, no murmurs, rubs, or gallops, no edema Respiratory: Adequate breathing efforts, no crackles, rales, rhonchi, or wheezing Musculoskeletal: no gross deformities, strength intact in all four extremities, no gross restriction of joint movements Skin:  no rashes, no hyperemia Neurological: no tremor with outstretched hands   Latest Reference Range & Units 12/01/19 11:50 09/07/21 00:00  TSH 0.41 - 5.90  1.610 1.06 (E)  Triiodothyronine (T3) 71 - 180 ng/dL 123   Thyroxine (T4) 4.5 - 12.0 ug/dL 7.1   (E): External lab result  ----------------------------------------------------------------------------------------------------------------------  ASSESSMENT / PLAN:  1. Thyroid Nodule  - I reviewed the images of her thyroid ultrasound along with the patient. I pointed out that the dominant nodules are large and  hypoechoic, this being a risk factor for cancer.  Otherwise, the nodules are: - without microcalcifications - without internal blood flow - more wide than tall - well delimited from surrounding tissue  Pt does not have a thyroid cancer family history or a personal history of RxTx to head/neck. All these would favor benignity.  - the only way that we can tell exactly if it is cancer or not is by doing a thyroid biopsy (FNA). I explained what the test entails. - We discussed about other options, to wait for another 6 months to a year and see if the nodule grows, and only intervene at that time.   - patient decided to have the FNA done now >> I ordered this.   - I explained that this is not cancer, we can continue to follow her on a yearly basis, and check another ultrasound in another year or 2. - she should let me know if she develops neck compression symptoms, in that case, we might need to do either lobectomy or thyroidectomy  - I did explain that, while thyroid surgery is not a complicated one, it still can have side effects and also she might have a risk of ~25% of becoming hypothyroid after hemithyroidectomy (which she is already on thyroid hormone replacement).    FOLLOW UP PLAN: Return in about 4 weeks (around 03/08/2022) for Thyroid follow up, FNA.    I spent 45 minutes in  the care of the patient today including review of labs from Thyroid Function, CMP, and other relevant labs ; imaging/biopsy records (current and previous including abstractions from other facilities); face-to-face time discussing  her lab results and symptoms, medications doses, her options of short and long term treatment based on the latest standards of care / guidelines;   and documenting the encounter.  [name]  participated in the discussions, expressed understanding, and voiced agreement with the above plans.  All questions were answered to her satisfaction. she is encouraged to contact clinic should she  have any questions or concerns prior to her return visit.    Rayetta Pigg, [facility] [address] [place], [place] [address] Phone: [phone] Fax: [phone]

## 2022-02-12 ENCOUNTER — Ambulatory Visit (INDEPENDENT_AMBULATORY_CARE_PROVIDER_SITE_OTHER): Payer: Medicare Other | Admitting: Pulmonary Disease

## 2022-02-12 ENCOUNTER — Encounter: Payer: Self-pay | Admitting: Pulmonary Disease

## 2022-02-12 VITALS — BP 120/76 | HR 60 | Temp 97.9°F | Ht 65.0 in | Wt 287.8 lb

## 2022-02-12 DIAGNOSIS — Z9989 Dependence on other enabling machines and devices: Secondary | ICD-10-CM

## 2022-02-12 DIAGNOSIS — G4733 Obstructive sleep apnea (adult) (pediatric): Secondary | ICD-10-CM | POA: Diagnosis not present

## 2022-02-12 NOTE — Patient Instructions (Signed)
Follow up in 1 year.

## 2022-02-12 NOTE — Progress Notes (Addendum)
[facility]  Chief Complaint  Patient presents with   Follow-up    She is doing well on her CPAP and is doing well.     Constitutional:  BP 120/76 (BP Location: Left Arm, Cuff Size: Normal)   Pulse 60   Temp 97.9 F (36.6 C) (Oral)   Ht '5\' 5"'$  (1.651 m)   Wt 287 lb 12.8 oz (130.5 kg)   SpO2 97%   BMI 47.89 kg/m   Past Medical History:  Anemia, Anxiety, OA, GERD, HA, Colon polyps, HLD, HTN, Hypothyroidism, PNA, Vertigo, TIA  Past Surgical History:  She  has a past surgical history that includes Tonsillectomy; Abdominal hysterectomy; Back surgery; Knee arthroscopy; Breast surgery; Cardiac catheterization; trigger thumb; carpel tunnel (Right); Total knee arthroplasty (Left, 06/21/2014); Colonoscopy; Esophagogastroduodenoscopy; and Total knee arthroplasty (Right, 03/21/2015).  Brief Summary:  Tamara Daugherty is a 71 y.o. female with eosinophilic asthma and obstructive sleep apnea.      Subjective:   No issues with CPAP.  Not needing inhalers much.  Has more congestion in sinuses over past few weeks.  Usually happens this time of year.  Not having cough, wheeze, or sputum.  Physical Exam:   Appearance - well kempt   ENMT - no sinus tenderness, no oral exudate, no LAN, Mallampati 3 airway, no stridor  Respiratory - equal breath sounds bilaterally, no wheezing or rales  CV - s1s2 regular rate and rhythm, no murmurs  Ext - no clubbing, no edema  Skin - no rashes  Psych - normal mood and affect     Pulmonary testing:  RAST 08/30/16 >> dust mights, IgE 121 PFT 09/27/16 >> FEV1 1.56 (71%), FEV1% 81, TLC 3.46 (64%), DLCO 69% PFT 01/09/17 >> FEV1 1.67 (76%), FEV1% 75, DLCO 65% PFT 04/23/18 >> FEV1 1.61 (76%), FEV1% 77, TLC 4.63 (87%), DLCO 69%  Chest Imaging:  HRCT chest 01/16/17 >> mild tracheomalacia, mild CM, PA 4.1 cm  Sleep Tests:  PSG 12/16/13 ([facility]) >> moderate OSA CPAP 09/26/19 to 10/25/19 >> used on 30 of 30  nights with average 7 hrs 27 min.  Average AHI 2.3 with CPAP 8 cm H2O  Cardiac Tests:  Echo 01/16/17 >> EF 55 to 60%, mild MR  Social History:  She  reports that she has never smoked. She has been exposed to tobacco smoke. She has never used smokeless tobacco. She reports current alcohol use. She reports that she does not use drugs.  Family History:  Her family history includes Cancer in her cousin, father, and maternal aunt; Heart disease in her mother and paternal aunt; Hyperlipidemia in her mother; Hypertension in her mother; Lung cancer in her father and paternal uncle; Rheum arthritis in her mother; Scleroderma in her mother; Stroke in her brother.     Assessment/Plan:   Eosinophilic asthma. - started on fasenra in May 2020; much improved since and will continue this - continue singulair for now - prn perforomist, pulmicort, duoneb - she has symbicort inhaler that she uses when she travels so she doesn't have to bring her nebulizer   Allergic rhinitis. - continue fasenra - is she is stable after this Fall allergy season, then she can try stopping claritin and then singulair in that order - azelastine had a bad taste and made her feels nauseous   Obstructive sleep apnea. - she is compliant with CPAP and reports benefit - she uses [facility] as her DME - continue CPAP 8 cm H2O - will call  her with result of CPAP download  Paroxysmal atrial fibrillation. - followed by Dr. Phineas Inches with cardiology  Iron deficiency anemia. - followed by Dr. Julien Nordmann with [facility]   Obesity. - she has been consist with diet, but her progress has hit a plateau - will arrange for referral to [facility] [place] [facility] to assess surgical options to assist with weight loss   Time Spent Involved in Patient Care on Day of Examination:  35 minutes  Follow up:   Patient Instructions  Follow up in 1 year  Medication List:   Allergies as of 02/12/2022       Reactions   Bee  Venom Anaphylaxis   Diphenhydramine Hcl Other (See Comments)   Paradoxical reaction/ becomes hyper for days   Diphenhydramine Hcl    Other reaction(s): Other - See Comments Paradoxical reaction/ becomes hyper for days   Percocet [oxycodone-acetaminophen] Other (See Comments)   hallucination   Propoxyphene    Sulfa Antibiotics Itching   Doxycycline    Feels bad   Oxycodone-acetaminophen    Penicillins Hives   No reaction to Ancef.  OK to give.          Medication List        Accurate as of February 12, 2022 12:05 PM. If you have any questions, ask your nurse or doctor.          AeroChamber MV inhaler Use as instructed   albuterol 108 (90 Base) MCG/ACT inhaler Commonly known as: VENTOLIN HFA Inhale 2 puffs into the lungs every 6 (six) hours as needed for wheezing or shortness of breath.   atorvastatin 10 MG tablet Commonly known as: LIPITOR Take 10 mg by mouth daily.   budesonide 0.5 MG/2ML nebulizer solution Commonly known as: PULMICORT USE 1 VIAL  IN  NEBULIZER TWICE  DAILY - At 10 AM And 5 PM - Rinse Mouth After Treatment   budesonide-formoterol 160-4.5 MCG/ACT inhaler Commonly known as: Symbicort Inhale 2 puffs into the lungs in the morning and at bedtime.   clopidogrel 75 MG tablet Commonly known as: PLAVIX Take 1 tablet by mouth daily.   diltiazem 240 MG 24 hr capsule Commonly known as: CARDIZEM CD Take 240 mg by mouth daily.   EPINEPHrine 0.3 mg/0.3 mL Soaj injection Commonly known as: EPI-PEN Inject 0.3 mg into the muscle once.   FISH OIL PO Take 1 capsule by mouth 2 (two) times daily.   fluticasone 50 MCG/ACT nasal spray Commonly known as: FLONASE Place 1 spray into both nostrils 2 (two) times daily as needed for allergies or rhinitis.   guaiFENesin 600 MG 12 hr tablet Commonly known as: MUCINEX Take 600 mg by mouth 2 (two) times daily as needed for to loosen phlegm.   hydrALAZINE 25 MG tablet Commonly known as: APRESOLINE TAKE 1 TABLET BY  MOUTH EVERY DAY IN THE MORNING AND AT BEDTIME   ipratropium-albuterol 0.5-2.5 (3) MG/3ML Soln Commonly known as: DUONEB Inhale into the lungs.   levothyroxine 50 MCG tablet Commonly known as: SYNTHROID Take 50 mcg by mouth daily before breakfast.   loratadine 10 MG tablet Commonly known as: CLARITIN Take 10 mg by mouth daily as needed for allergies.   metoprolol succinate 50 MG 24 hr tablet Commonly known as: TOPROL-XL Take 50 mg by mouth daily. Take with or immediately following a meal.   montelukast 10 MG tablet Commonly known as: SINGULAIR Take 1 tablet (10 mg total) by mouth at bedtime.   multivitamin with minerals tablet Take 1  tablet by mouth daily.   olmesartan-hydrochlorothiazide 40-25 MG tablet Commonly known as: BENICAR HCT Take 1 tablet by mouth daily.   pantoprazole 40 MG tablet Commonly known as: PROTONIX Take 40 mg by mouth daily.   Perforomist 20 MCG/2ML nebulizer solution Generic drug: formoterol USE 1 VIAL  IN  NEBULIZER TWICE  DAILY - - Morning And Evening   potassium chloride 10 MEQ CR capsule Commonly known as: MICRO-K Take 30 mEq by mouth daily.   pyridOXINE 50 MG tablet Commonly known as: B-6 Take 50 mg by mouth daily.   torsemide 20 MG tablet Commonly known as: DEMADEX Take 1 tablet (20 mg total) by mouth as needed.   TURMERIC PO Take 1 capsule by mouth daily.   Vitamin D (Ergocalciferol) 1.25 MG (50000 UNIT) Caps capsule Commonly known as: DRISDOL Take 1 capsule (50,000 Units total) by mouth every 7 (seven) days.   Vitron-C 65-125 MG Tabs Generic drug: Iron-Vitamin C Take by mouth.        Signature:  Chesley Mires, MD [facility] Pager - [phone] 02/12/2022, 12:05 PM

## 2022-02-14 ENCOUNTER — Ambulatory Visit (HOSPITAL_COMMUNITY)
Admission: RE | Admit: 2022-02-14 | Discharge: 2022-02-14 | Disposition: A | Discharge status: Home / Self Care | Payer: Medicare Other | Source: Ambulatory Visit | Attending: Nurse Practitioner | Admitting: Nurse Practitioner

## 2022-02-14 ENCOUNTER — Encounter (HOSPITAL_COMMUNITY): Payer: Self-pay

## 2022-02-14 DIAGNOSIS — E041 Nontoxic single thyroid nodule: Secondary | ICD-10-CM | POA: Diagnosis not present

## 2022-02-14 MED ORDER — LIDOCAINE HCL (PF) 2 % IJ SOLN
INTRAMUSCULAR | Status: AC
  Administered 2022-02-14: 10 mL
  Filled 2022-02-14: qty 10

## 2022-02-14 NOTE — Progress Notes (Signed)
PT tolerated thyroid biopsy procedure well today. Labs and afirma obtained and sent for pathology. PT ambulatory at discharge with no acute distress noted and verbalized understanding of discharge instructions. 

## 2022-02-18 LAB — CYTOLOGY - NON PAP

## 2022-02-19 ENCOUNTER — Telehealth: Payer: Self-pay | Admitting: Nurse Practitioner

## 2022-02-19 NOTE — Telephone Encounter (Signed)
Discussed with pt we haven't received her biopsy results and it usually takes 10-14 days. Pt voiced understanding.

## 2022-02-19 NOTE — Telephone Encounter (Signed)
New message   Patient calling for Thyroid test results.

## 2022-02-20 ENCOUNTER — Ambulatory Visit (INDEPENDENT_AMBULATORY_CARE_PROVIDER_SITE_OTHER): Payer: Medicare Other

## 2022-02-20 VITALS — BP 129/79 | HR 63 | Temp 98.1°F | Resp 18 | Ht 65.0 in | Wt 294.2 lb

## 2022-02-20 DIAGNOSIS — J4550 Severe persistent asthma, uncomplicated: Secondary | ICD-10-CM | POA: Diagnosis not present

## 2022-02-20 MED ORDER — BENRALIZUMAB 30 MG/ML ~~LOC~~ SOSY
30.0000 mg | PREFILLED_SYRINGE | Freq: Once | SUBCUTANEOUS | Status: AC
  Administered 2022-02-20: 30 mg via SUBCUTANEOUS
  Filled 2022-02-20: qty 1

## 2022-02-20 NOTE — Progress Notes (Addendum)
Diagnosis: Asthma  Provider:  Marshell Garfinkel MD  Procedure: Injection  Fasenra (Benralizumab), Dose: 30 mg, Site: subcutaneous, Number of injections: 1  Discharge: Condition: Good, Destination: Home . AVS Declined.  Performed by:  Arnoldo Morale, RN

## 2022-03-07 DIAGNOSIS — E063 Autoimmune thyroiditis: Secondary | ICD-10-CM | POA: Diagnosis not present

## 2022-03-07 DIAGNOSIS — D649 Anemia, unspecified: Secondary | ICD-10-CM | POA: Diagnosis not present

## 2022-03-07 DIAGNOSIS — Z79899 Other long term (current) drug therapy: Secondary | ICD-10-CM | POA: Diagnosis not present

## 2022-03-07 DIAGNOSIS — E039 Hypothyroidism, unspecified: Secondary | ICD-10-CM | POA: Diagnosis not present

## 2022-03-07 DIAGNOSIS — I480 Paroxysmal atrial fibrillation: Secondary | ICD-10-CM | POA: Diagnosis not present

## 2022-03-07 DIAGNOSIS — J45901 Unspecified asthma with (acute) exacerbation: Secondary | ICD-10-CM | POA: Diagnosis not present

## 2022-03-07 DIAGNOSIS — E782 Mixed hyperlipidemia: Secondary | ICD-10-CM | POA: Diagnosis not present

## 2022-03-07 DIAGNOSIS — Z7902 Long term (current) use of antithrombotics/antiplatelets: Secondary | ICD-10-CM | POA: Diagnosis not present

## 2022-03-07 DIAGNOSIS — R7303 Prediabetes: Secondary | ICD-10-CM | POA: Diagnosis not present

## 2022-03-07 DIAGNOSIS — D508 Other iron deficiency anemias: Secondary | ICD-10-CM | POA: Diagnosis not present

## 2022-03-07 DIAGNOSIS — D8481 Immunodeficiency due to conditions classified elsewhere: Secondary | ICD-10-CM | POA: Diagnosis not present

## 2022-03-07 DIAGNOSIS — I10 Essential (primary) hypertension: Secondary | ICD-10-CM | POA: Diagnosis not present

## 2022-03-11 ENCOUNTER — Ambulatory Visit: Payer: Medicare Other | Admitting: Nurse Practitioner

## 2022-03-15 DIAGNOSIS — J4550 Severe persistent asthma, uncomplicated: Secondary | ICD-10-CM | POA: Diagnosis not present

## 2022-03-15 DIAGNOSIS — E782 Mixed hyperlipidemia: Secondary | ICD-10-CM | POA: Diagnosis not present

## 2022-03-15 DIAGNOSIS — I10 Essential (primary) hypertension: Secondary | ICD-10-CM | POA: Diagnosis not present

## 2022-03-15 DIAGNOSIS — Z23 Encounter for immunization: Secondary | ICD-10-CM | POA: Diagnosis not present

## 2022-03-15 DIAGNOSIS — E039 Hypothyroidism, unspecified: Secondary | ICD-10-CM | POA: Diagnosis not present

## 2022-03-20 ENCOUNTER — Ambulatory Visit: Payer: Medicare Other | Admitting: Nurse Practitioner

## 2022-03-26 ENCOUNTER — Encounter (HOSPITAL_COMMUNITY): Payer: Self-pay

## 2022-03-28 ENCOUNTER — Telehealth (INDEPENDENT_AMBULATORY_CARE_PROVIDER_SITE_OTHER): Payer: Medicare Other | Admitting: Nurse Practitioner

## 2022-03-28 ENCOUNTER — Encounter: Payer: Self-pay | Admitting: Nurse Practitioner

## 2022-03-28 ENCOUNTER — Ambulatory Visit: Payer: Medicare Other | Admitting: Nurse Practitioner

## 2022-03-28 VITALS — Ht 65.0 in | Wt 287.0 lb

## 2022-03-28 DIAGNOSIS — E041 Nontoxic single thyroid nodule: Secondary | ICD-10-CM | POA: Diagnosis not present

## 2022-03-28 NOTE — Progress Notes (Signed)
Endocrinology Follow Up Note 03/28/22    TELEHEALTH VISIT: The patient is being engaged in telehealth visit.  This type of visit limits physical examination significantly, and thus is not preferable over face-to-face encounters.  I connected with  Tamara Daugherty on 03/28/22 by a video enabled telemedicine application and verified that I am speaking with the correct person using two identifiers.   I discussed the limitations of evaluation and management by telemedicine. The patient expressed understanding and agreed to proceed.    The participants involved in this visit include: Brita Romp, NP located at [facility] and Tamara Daugherty  located at their personal residence listed.   ---------------------------------------------------------------------------------------------------------------------- Subjective    Past Medical History:  Diagnosis Date   Allergy    Anemia    takes Iron daily   Anxiety    Arthritis    Asthma    Symbicort daily and Albuterol daily as needed   B12 deficiency    Back pain    Bilateral swelling of feet    Bruises easily    d/t being on Plavix    Chest pain    Complication of anesthesia    long time to wake up - referred to pulmonary after carpel tunnel procedure - for asthma and undiagnosed sleep apnea   Constipation    Depression    GERD (gastroesophageal reflux disease)    takes Protonix daily   Headache    Heart murmur    [facility] [place] [facility]   Heart murmur    History of blood transfusion    no abnormal reaction noted   History of colon polyps    beningn   History of migraine    couple of wks ago was the last one   Hyperlipidemia    takes Atorvastatin daily   Hypertension    takes Benicar and Diltiazem daily   Hypothyroidism    takes Synthroid daily   Itching    takes Atarax daily as needed   Joint pain    Joint pain    Joint swelling    Lactose intolerance    Leg swelling    Liver cyst     Mini stroke    Multiple allergies    takes Claritin daily as needed as well as using Flonase if needed   Muscle spasm    takes Baclofen if needed   Osteoarthritis    Peripheral edema    takes Torsemide daily   Pneumonia    hx of   Prediabetes    Primary localized osteoarthritis of left knee 06/21/2014   Primary localized osteoarthritis of right knee 03/21/2015   Restrictive lung disease    Sleep apnea    cpap   Sleep apnea    Swallowing difficulty    TIA (transient ischemic attack)    takes Plavix daily;notices right side is slightly weaker than left   Vertigo    takes Meclizine daily as needed   Vitamin D deficiency     Past Surgical History:  Procedure Laterality Date   ABDOMINAL HYSTERECTOMY     BACK SURGERY     BREAST SURGERY     cyst removal   CARDIAC CATHETERIZATION     carpel tunnel Right    COLONOSCOPY     ESOPHAGOGASTRODUODENOSCOPY     KNEE ARTHROSCOPY     TONSILLECTOMY     TOTAL KNEE ARTHROPLASTY Left 06/21/2014   Procedure: LEFT TOTAL KNEE ARTHROPLASTY;  Surgeon: Johnny Bridge, MD;  Location: [facility]  OR;  Service: Orthopedics;  Laterality: Left;   TOTAL KNEE ARTHROPLASTY Right 03/21/2015   Procedure: TOTAL KNEE ARTHROPLASTY STEROID INECTION BOTH KNEES;  Surgeon: Marchia Bond, MD;  Location: [place];  Service: Orthopedics;  Laterality: Right;   trigger thumb      Social History   Socioeconomic History   Marital status: Married    Spouse name: Not on file   Number of children: Not on file   Years of education: Not on file   Highest education level: Not on file  Occupational History   Occupation: retired  Tobacco Use   Smoking status: Never    Passive exposure: Yes   Smokeless tobacco: Never   Tobacco comments:    Father smoked briefly.   Vaping Use   Vaping Use: Never used  Substance and Sexual Activity   Alcohol use: Yes    Comment: social   Drug use: No   Sexual activity: Not on file  Other Topics Concern   Not on file  Social History Narrative    [facility] (08/30/16):   Originally from [place]. 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 No pets currently. No bird, mold, or hot tub exposure. Does have indoor plants. No draperies. Does have carpet in the bedroom. No down bedding that she is aware of.    Social Determinants of Health   Financial Resource Strain: Not on file  Food Insecurity: Not on file  Transportation Needs: Not on file  Physical Activity: Not on file  Stress: Not on file  Social Connections: Not on file  Intimate Partner Violence: Not on file    Current Outpatient Medications on File Prior to Visit  Medication Sig Dispense Refill   albuterol (VENTOLIN HFA) 108 (90 Base) MCG/ACT inhaler Inhale 2 puffs into the lungs every 6 (six) hours as needed for wheezing or shortness of breath. 18 g 3   atorvastatin (LIPITOR) 10 MG tablet Take 10 mg by mouth daily.     budesonide (PULMICORT) 0.5 MG/2ML nebulizer solution USE 1 VIAL  IN  NEBULIZER TWICE  DAILY - At 10 AM And 5 PM - Rinse Mouth After Treatment 120 mL 11   budesonide-formoterol (SYMBICORT) 160-4.5 MCG/ACT inhaler Inhale 2 puffs into the lungs in the morning and at bedtime. 1 each 12   clopidogrel (PLAVIX) 75 MG tablet Take 1 tablet by mouth daily.     diltiazem (CARDIZEM CD) 240 MG 24 hr capsule Take 240 mg by mouth daily.     EPINEPHrine 0.3 mg/0.3 mL IJ SOAJ injection Inject 0.3 mg into the muscle once.     fluticasone (FLONASE) 50 MCG/ACT nasal spray Place 1 spray into both nostrils 2 (two) times daily as needed for allergies or rhinitis.     formoterol (PERFOROMIST) 20 MCG/2ML nebulizer solution USE 1 VIAL  IN  NEBULIZER TWICE  DAILY - - Morning And Evening 120 mL 11   guaiFENesin (MUCINEX) 600 MG 12 hr tablet Take 600 mg by mouth 2 (two) times daily as needed for to loosen phlegm.     hydrALAZINE (APRESOLINE) 25 MG tablet TAKE 1 TABLET BY MOUTH EVERY DAY IN THE  MORNING AND AT BEDTIME     ipratropium-albuterol (DUONEB) 0.5-2.5 (3) MG/3ML SOLN Inhale into the lungs.     Iron-Vitamin C (VITRON-C) 65-125 MG TABS Take by mouth.     levothyroxine (SYNTHROID, LEVOTHROID) 50 MCG tablet Take 50 mcg  by mouth daily before breakfast.     loratadine (CLARITIN) 10 MG tablet Take 10 mg by mouth daily as needed for allergies.     metoprolol succinate (TOPROL-XL) 50 MG 24 hr tablet Take 50 mg by mouth daily. Take with or immediately following a meal.     montelukast (SINGULAIR) 10 MG tablet Take 1 tablet (10 mg total) by mouth at bedtime. 90 tablet 3   Multiple Vitamins-Minerals (MULTIVITAMIN WITH MINERALS) tablet Take 1 tablet by mouth daily.     olmesartan-hydrochlorothiazide (BENICAR HCT) 40-25 MG per tablet Take 1 tablet by mouth daily.     Omega-3 Fatty Acids (FISH OIL PO) Take 1 capsule by mouth 2 (two) times daily.     pantoprazole (PROTONIX) 40 MG tablet Take 40 mg by mouth daily.     potassium chloride (MICRO-K) 10 MEQ CR capsule Take 30 mEq by mouth daily.      pyridOXINE (B-6) 50 MG tablet Take 50 mg by mouth daily.     Spacer/Aero-Holding Chambers (AEROCHAMBER MV) inhaler Use as instructed 1 each 0   torsemide (DEMADEX) 20 MG tablet Take 1 tablet (20 mg total) by mouth as needed. 30 tablet 1   TURMERIC PO Take 1 capsule by mouth daily.     Vitamin D, Ergocalciferol, (DRISDOL) 1.25 MG (50000 UNIT) CAPS capsule Take 1 capsule (50,000 Units total) by mouth every 7 (seven) days. 4 capsule 0   No current facility-administered medications on file prior to visit.      HPI   Tamara Daugherty is a 71 y.o.-year-old female, referred by her PCP, Dr.Paul Brynda Greathouse, for evaluation for multinodular goiter.  She had carotid doppler and a thyroid nodule was incidentally found, therefore her PCP ordered dedicated thyroid ultrasound to evaluate.  Thyroid U/S: 01/10/22 CLINICAL DATA:  Thyroid nodule   EXAM: THYROID ULTRASOUND   TECHNIQUE: Ultrasound examination of the  thyroid gland and adjacent soft tissues was performed.   COMPARISON:  None available   FINDINGS: Parenchymal Echotexture: Mildly heterogenous   Isthmus: 0.4 cm   Right lobe: 4.7 x 1.4 x 2.5 cm   Left lobe: 4.6 x 1.4 x 1.7 cm   _________________________________________________________   Estimated total number of nodules >/= 1 cm: 2   Number of spongiform nodules >/=  2 cm not described below (TR1): 0   Number of mixed cystic and solid nodules >/= 1.5 cm not described below (TR2): 0   _________________________________________________________   Nodule # 1:   Location: Right; superior   Maximum size: 2.2 cm; Other 2 dimensions: 1.0 x 1.9 cm   Composition: solid/almost completely solid (2)   Echogenicity: hypoechoic (2)   Shape: not taller-than-wide (0)   Margins: smooth (0)   Echogenic foci: none (0)   ACR TI-RADS total points: 4.   ACR TI-RADS risk category: TR4 (4-6 points).   ACR TI-RADS recommendations:   **Given size (>/= 1.5 cm) and appearance, fine needle aspiration of this moderately suspicious nodule should be considered based on TI-RADS criteria.   _________________________________________________________   Nodule 2: 1.5 cm cystic right mid thyroid nodule does not meet criteria for imaging surveillance or FNA.   IMPRESSION: Nodule 1 (TI-RADS 4), measuring 2.2 cm, located in the superior right thyroid lobe, meets criteria for FNA.   The above is in keeping with the ACR TI-RADS recommendations - J Am Coll Radiol 2017;14:587-595.     Electronically Signed   By: Miachel Roux M.D.   On: 01/10/2022 14:49    I  reviewed pt's thyroid tests: Lab Results  Component Value Date   TSH 1.06 09/07/2021   TSH 1.610 12/01/2019     She does complain of intermittent difficulty swallowing (especially pills) and sometimes gets choked.  She denies any difficulty breathing while laying down, hoarseness, nodules in neck.  She does have family history of  hypothyroidism in a cousin and personal history of hypothyroidism (on replacement therapy). No FH of thyroid cancer. No h/o radiation tx to head or neck.  No seaweed or kelp. No recent contrast studies. No steroid use. No herbal supplements. No Biotin supplements or Hair, Skin and Nails vitamins.  Pt also has a history of anemia, prediabetes, heart murmur, GERD, TIA (on Plavix), hypothyroidism (on replacement), and OSA.  Review of systems  Constitutional: + Minimally fluctuating body weight,  current Body mass index is 47.76 kg/m. , no fatigue, no subjective hyperthermia, no subjective hypothermia Eyes: no blurry vision, no xerophthalmia ENT: no sore throat, no nodules palpated in throat, + intermittent dysphagia/odynophagia, no hoarseness Cardiovascular: no chest pain, no shortness of breath, no palpitations, no leg swelling Respiratory: no cough, no shortness of breath Gastrointestinal: no nausea/vomiting/diarrhea Musculoskeletal: no muscle/joint aches Skin: no rashes, no hyperemia Neurological: no tremors, no numbness, no tingling, no dizziness Psychiatric: no depression, no anxiety  ---------------------------------------------------------------------------------------------------------------------- Objective    Ht '5\' 5"'$  (1.651 m)   Wt 287 lb (130.2 kg)   BMI 47.76 kg/m    BP Readings from Last 3 Encounters:  02/20/22 129/79  02/14/22 131/70  02/12/22 120/76    Wt Readings from Last 3 Encounters:  03/28/22 287 lb (130.2 kg)  02/20/22 294 lb 3.2 oz (133.4 kg)  02/12/22 287 lb 12.8 oz (130.5 kg)    Physical Exam- Telehealth- significantly limited due to nature of visit  Constitutional: Body mass index is 47.76 kg/m. , not in acute distress, normal state of mind Respiratory: Adequate breathing efforts   Latest Reference Range & Units 12/01/19 11:50 09/07/21 00:00  TSH 0.41 - 5.90  1.610 1.06 (E)  Triiodothyronine (T3) 71 - 180 ng/dL 123   Thyroxine (T4) 4.5 - 12.0  ug/dL 7.1   (E): External lab result        GENESYS T4 TSH   Ref Range & Units 3 wk ago Comments  Thyroxine Free 0.51 - 1.59 ng/dL 1.12  Access Free T4: Potential of falsely elevated results when biotin concentrations   are greater than 10 ng/mL  TSH 0.55 - 4.78 uIU/mL 1.57     ----------------------------------------------------------------------------------------------------------------------  ASSESSMENT / PLAN:  1. Thyroid Nodule  - She had FNA of suspicious thyroid nodule which was sent to [facility] and found to be benign.  Her thyroid function tests were also normal.    Will recommend follow up in 1 year with repeat thyroid ultrasound for surveillance and thyroid labs.   FOLLOW UP PLAN: Return in about 1 year (around 03/29/2023) for Thyroid follow up, Previsit labs, thyroid ultrasound.      I spent 20 minutes dedicated to the care of this patient on the date of this encounter to include pre-visit review of records, face-to-face time with the patient, and post visit ordering of testing.  Tamara Daugherty  participated in the discussions, expressed understanding, and voiced agreement with the above plans.  All questions were answered to her satisfaction. she is encouraged to contact clinic should she have any questions or concerns prior to her return visit.    Rayetta Pigg, FNP-BC [facility] [address]  [place], [place] [address] Phone: [phone] Fax: [phone]

## 2022-04-17 ENCOUNTER — Ambulatory Visit (INDEPENDENT_AMBULATORY_CARE_PROVIDER_SITE_OTHER): Payer: Medicare Other

## 2022-04-17 VITALS — BP 120/74 | HR 62 | Temp 97.5°F | Resp 20 | Ht 65.0 in | Wt 295.2 lb

## 2022-04-17 DIAGNOSIS — J4550 Severe persistent asthma, uncomplicated: Secondary | ICD-10-CM

## 2022-04-17 MED ORDER — BENRALIZUMAB 30 MG/ML ~~LOC~~ SOSY
30.0000 mg | PREFILLED_SYRINGE | Freq: Once | SUBCUTANEOUS | Status: AC
  Administered 2022-04-17: 30 mg via SUBCUTANEOUS
  Filled 2022-04-17: qty 1

## 2022-04-17 NOTE — Progress Notes (Signed)
Diagnosis: Asthma  Provider:  Marshell Garfinkel MD  Procedure: Injection  Fasenra (Benralizumab), Dose: 30 mg, Site: subcutaneous, Number of injections: 1  Post Care:  N/A  Discharge: Condition: Good, Destination: Home . AVS provided to patient.   Performed by:  Cleophus Molt, RN

## 2022-05-02 ENCOUNTER — Encounter (HOSPITAL_BASED_OUTPATIENT_CLINIC_OR_DEPARTMENT_OTHER): Payer: Self-pay | Admitting: Pulmonary Disease

## 2022-05-02 ENCOUNTER — Ambulatory Visit (INDEPENDENT_AMBULATORY_CARE_PROVIDER_SITE_OTHER): Payer: Medicare Other | Admitting: Pulmonary Disease

## 2022-05-02 ENCOUNTER — Ambulatory Visit (HOSPITAL_BASED_OUTPATIENT_CLINIC_OR_DEPARTMENT_OTHER): Payer: Medicare Other

## 2022-05-02 VITALS — BP 130/64 | HR 59 | Ht 65.0 in | Wt 297.0 lb

## 2022-05-02 DIAGNOSIS — J8283 Eosinophilic asthma: Secondary | ICD-10-CM

## 2022-05-02 DIAGNOSIS — R0600 Dyspnea, unspecified: Secondary | ICD-10-CM | POA: Diagnosis not present

## 2022-05-02 DIAGNOSIS — R0609 Other forms of dyspnea: Secondary | ICD-10-CM | POA: Diagnosis not present

## 2022-05-02 DIAGNOSIS — G4733 Obstructive sleep apnea (adult) (pediatric): Secondary | ICD-10-CM | POA: Diagnosis not present

## 2022-05-02 NOTE — Progress Notes (Signed)
[facility]  Chief Complaint  Patient presents with   Follow-up    Cpap compliance  Wheezing coughing over past week or so    Constitutional:  BP 130/64 (BP Location: Left Arm, Cuff Size: Large)   Pulse (!) 59   Ht '5\' 5"'$  (1.651 m)   Wt 297 lb (134.7 kg)   SpO2 96%   BMI 49.42 kg/m   Past Medical History:  Anemia, Anxiety, OA, GERD, HA, Colon polyps, HLD, HTN, Hypothyroidism, PNA, Vertigo, TIA  Past Surgical History:  She  has a past surgical history that includes Tonsillectomy; Abdominal hysterectomy; Back surgery; Knee arthroscopy; Breast surgery; Cardiac catheterization; trigger thumb; carpel tunnel (Right); Total knee arthroplasty (Left, 06/21/2014); Colonoscopy; Esophagogastroduodenoscopy; and Total knee arthroplasty (Right, 03/21/2015).  Brief Summary:  Tamara Daugherty is a 71 y.o. female with eosinophilic asthma and obstructive sleep apnea.      Subjective:   She is with her husband.  She has been feeling more short of breath for the past several weeks.  She has some cough and congestion with clear sputum.  She wheezes sometimes.  Has mild ankle swelling.  Has sinus congestion.  Denies sore throat, fever, hemoptysis, or chest pain.  She has been using symbicort, pulmicort, and perforomist.  These don't seem to help as much as before.  No sick or environmental exposures.  She uses CPAP nightly.  No issues with mask or pressure setting.  She maintained her SpO2 > 90% on room air while walking today.  She developed wheezing and shortness of breath while walking.  She recovered after resting for a couple of minutes without any other intervention.  Physical Exam:   Appearance - well kempt, sitting in a wheelchair  ENMT - no sinus tenderness, no oral exudate, no LAN, Mallampati 3 airway, no stridor  Respiratory - equal breath sounds bilaterally, no wheezing or rales  CV - s1s2 regular rate and rhythm, no murmurs  Ext - no  clubbing, no edema  Skin - no rashes  Psych - normal mood and affect     Pulmonary testing:  RAST 08/30/16 >> dust mights, IgE 121 PFT 09/27/16 >> FEV1 1.56 (71%), FEV1% 81, TLC 3.46 (64%), DLCO 69% PFT 01/09/17 >> FEV1 1.67 (76%), FEV1% 75, DLCO 65% PFT 04/23/18 >> FEV1 1.61 (76%), FEV1% 77, TLC 4.63 (87%), DLCO 69%  Chest Imaging:  HRCT chest 01/16/17 >> mild tracheomalacia, mild CM, PA 4.1 cm  Sleep Tests:  PSG 12/16/13 ([facility]) >> moderate OSA CPAP 04/01/22 to 04/30/22 >> used on 30 of 30 nights with average 7 hrs 58 min.  Average AHI 4 with CPAP 7 cm H2O  Cardiac Tests:  Echo 12/26/21 >> EF 55%, grade 1 DD, mod LA dilation, mod LA dilation, mod MR  Social History:  She  reports that she has never smoked. She has been exposed to tobacco smoke. She has never used smokeless tobacco. She reports current alcohol use. She reports that she does not use drugs.  Family History:  Her family history includes Cancer in her cousin, father, and maternal aunt; Heart disease in her mother and paternal aunt; Hyperlipidemia in her mother; Hypertension in her mother; Lung cancer in her father and paternal uncle; Rheum arthritis in her mother; Scleroderma in her mother; Stroke in her brother.     Assessment/Plan:   Dyspnea on exertion. - her symptoms description isn't typical of asthma and she hasn't noticed improvement with augmented inhaler therapy - will arrange  for CBC, CMET, BNP, and Chest xray - she might need sooner follow up with cardiology  Eosinophilic asthma. - started on fasenra in May 2020, and this has helped control her asthma and reduce exacerbations; she is to continue fasenra - continue perforomist, pulmicort, and singulair - prn albuterol - explained there isn't an advantage at this point to using symbicort and she can stop this - don't think she needs prednisone or antibiotics at this time   Allergic rhinitis. - continue fasenra, claritin, singulair - azelastine had a  bad taste and made her feels nauseous   Obstructive sleep apnea. - she is compliant with CPAP and reports benefit - she uses [employer] as her DME - current CPAP ordered November 2020 - continue CPAP 7 cm H2O  Paroxysmal atrial fibrillation. - followed by Dr. Phineas Inches with cardiology  Iron deficiency anemia. - followed by Dr. Julien Nordmann with [facility]   Time Spent Involved in Patient Care on Day of Examination:  38 minutes  Follow up:   Patient Instructions  Chest xray and lab work today  Follow up in 2 months  Medication List:   Allergies as of 05/02/2022       Reactions   Bee Venom Anaphylaxis   Diphenhydramine Hcl Other (See Comments)   Paradoxical reaction/ becomes hyper for days   Diphenhydramine Hcl    Other reaction(s): Other - See Comments Paradoxical reaction/ becomes hyper for days   Percocet [oxycodone-acetaminophen] Other (See Comments)   hallucination   Propoxyphene    Sulfa Antibiotics Itching   Doxycycline    Feels bad   Oxycodone-acetaminophen    Penicillins Hives   No reaction to Ancef.  OK to give.          Medication List        Accurate as of May 02, 2022 11:25 AM. If you have any questions, ask your nurse or doctor.          AeroChamber MV inhaler Use as instructed   albuterol 108 (90 Base) MCG/ACT inhaler Commonly known as: VENTOLIN HFA Inhale 2 puffs into the lungs every 6 (six) hours as needed for wheezing or shortness of breath.   atorvastatin 10 MG tablet Commonly known as: LIPITOR Take 10 mg by mouth daily.   budesonide 0.5 MG/2ML nebulizer solution Commonly known as: PULMICORT USE 1 VIAL  IN  NEBULIZER TWICE  DAILY - At 10 AM And 5 PM - Rinse Mouth After Treatment   budesonide-formoterol 160-4.5 MCG/ACT inhaler Commonly known as: Symbicort Inhale 2 puffs into the lungs in the morning and at bedtime.   clopidogrel 75 MG tablet Commonly known as: PLAVIX Take 1 tablet by mouth daily.   diltiazem  240 MG 24 hr capsule Commonly known as: CARDIZEM CD Take 240 mg by mouth daily.   EPINEPHrine 0.3 mg/0.3 mL Soaj injection Commonly known as: EPI-PEN Inject 0.3 mg into the muscle once.   FISH OIL PO Take 1 capsule by mouth 2 (two) times daily.   fluticasone 50 MCG/ACT nasal spray Commonly known as: FLONASE Place 1 spray into both nostrils 2 (two) times daily as needed for allergies or rhinitis.   guaiFENesin 600 MG 12 hr tablet Commonly known as: MUCINEX Take 600 mg by mouth 2 (two) times daily as needed for to loosen phlegm.   hydrALAZINE 25 MG tablet Commonly known as: APRESOLINE TAKE 1 TABLET BY MOUTH EVERY DAY IN THE MORNING AND AT BEDTIME   ipratropium-albuterol 0.5-2.5 (3) MG/3ML Soln Commonly known as:  DUONEB Inhale into the lungs.   levothyroxine 50 MCG tablet Commonly known as: SYNTHROID Take 50 mcg by mouth daily before breakfast.   loratadine 10 MG tablet Commonly known as: CLARITIN Take 10 mg by mouth daily as needed for allergies.   metoprolol succinate 50 MG 24 hr tablet Commonly known as: TOPROL-XL Take 50 mg by mouth daily. Take with or immediately following a meal.   montelukast 10 MG tablet Commonly known as: SINGULAIR Take 1 tablet (10 mg total) by mouth at bedtime.   multivitamin with minerals tablet Take 1 tablet by mouth daily.   olmesartan-hydrochlorothiazide 40-25 MG tablet Commonly known as: BENICAR HCT Take 1 tablet by mouth daily.   pantoprazole 40 MG tablet Commonly known as: PROTONIX Take 40 mg by mouth daily.   Perforomist 20 MCG/2ML nebulizer solution Generic drug: formoterol USE 1 VIAL  IN  NEBULIZER TWICE  DAILY - - Morning And Evening   potassium chloride 10 MEQ CR capsule Commonly known as: MICRO-K Take 30 mEq by mouth daily.   pyridOXINE 50 MG tablet Commonly known as: B-6 Take 50 mg by mouth daily.   torsemide 20 MG tablet Commonly known as: DEMADEX Take 1 tablet (20 mg total) by mouth as needed.   TURMERIC  PO Take 1 capsule by mouth daily.   Vitamin D (Ergocalciferol) 1.25 MG (50000 UNIT) Caps capsule Commonly known as: DRISDOL Take 1 capsule (50,000 Units total) by mouth every 7 (seven) days.   Vitron-C 65-125 MG Tabs Generic drug: Iron-Vitamin C Take by mouth.        Signature:  Chesley Mires, MD [place] Pager - [phone] 05/02/2022, 11:25 AM

## 2022-05-02 NOTE — Patient Instructions (Signed)
Chest xray and lab work today  Follow up in 2 months

## 2022-05-04 ENCOUNTER — Encounter (HOSPITAL_BASED_OUTPATIENT_CLINIC_OR_DEPARTMENT_OTHER): Payer: Self-pay | Admitting: Pulmonary Disease

## 2022-05-04 LAB — CBC WITH DIFFERENTIAL/PLATELET
Basophils Absolute: 0.1 10*3/uL (ref 0.0–0.2)
Basos: 1 %
EOS (ABSOLUTE): 0 10*3/uL (ref 0.0–0.4)
Eos: 0 %
Hematocrit: 25.6 % — ABNORMAL LOW (ref 34.0–46.6)
Hemoglobin: 8.1 g/dL — ABNORMAL LOW (ref 11.1–15.9)
Immature Grans (Abs): 0 10*3/uL (ref 0.0–0.1)
Immature Granulocytes: 0 %
Lymphocytes Absolute: 2.1 10*3/uL (ref 0.7–3.1)
Lymphs: 40 %
MCH: 29.3 pg (ref 26.6–33.0)
MCHC: 31.6 g/dL (ref 31.5–35.7)
MCV: 93 fL (ref 79–97)
Monocytes Absolute: 0.6 10*3/uL (ref 0.1–0.9)
Monocytes: 12 %
Neutrophils Absolute: 2.5 10*3/uL (ref 1.4–7.0)
Neutrophils: 47 %
Platelets: 446 10*3/uL (ref 150–450)
RBC: 2.76 x10E6/uL — ABNORMAL LOW (ref 3.77–5.28)
RDW: 14 % (ref 11.7–15.4)
WBC: 5.3 10*3/uL (ref 3.4–10.8)

## 2022-05-04 LAB — COMPREHENSIVE METABOLIC PANEL
ALT: 16 IU/L (ref 0–32)
AST: 16 IU/L (ref 0–40)
Albumin/Globulin Ratio: 1 — ABNORMAL LOW (ref 1.2–2.2)
Albumin: 3.8 g/dL — ABNORMAL LOW (ref 3.9–4.9)
Alkaline Phosphatase: 49 IU/L (ref 44–121)
BUN/Creatinine Ratio: 13 (ref 12–28)
BUN: 13 mg/dL (ref 8–27)
Bilirubin Total: 0.2 mg/dL (ref 0.0–1.2)
CO2: 26 mmol/L (ref 20–29)
Calcium: 9.3 mg/dL (ref 8.7–10.3)
Chloride: 95 mmol/L — ABNORMAL LOW (ref 96–106)
Creatinine, Ser: 1.03 mg/dL — ABNORMAL HIGH (ref 0.57–1.00)
Globulin, Total: 4 g/dL (ref 1.5–4.5)
Glucose: 92 mg/dL (ref 70–99)
Potassium: 4.3 mmol/L (ref 3.5–5.2)
Sodium: 133 mmol/L — ABNORMAL LOW (ref 134–144)
Total Protein: 7.8 g/dL (ref 6.0–8.5)
eGFR: 59 mL/min/{1.73_m2} — ABNORMAL LOW (ref >59)

## 2022-05-04 LAB — BRAIN NATRIURETIC PEPTIDE: BNP: 112.1 pg/mL — ABNORMAL HIGH (ref 0.0–100.0)

## 2022-05-06 ENCOUNTER — Other Ambulatory Visit: Payer: Self-pay | Admitting: Internal Medicine

## 2022-05-06 ENCOUNTER — Encounter: Payer: Self-pay | Admitting: Internal Medicine

## 2022-05-06 DIAGNOSIS — D500 Iron deficiency anemia secondary to blood loss (chronic): Secondary | ICD-10-CM

## 2022-05-06 NOTE — Telephone Encounter (Signed)
Please let her know her chest xray showed mild heart enlargement which is related to her high blood pressure.  She should continue therapy for her heart with Dr. Harl Bowie with cardiology.  Her CBC shows she is more anemic.  She should follow up with Dr. Julien Nordmann to discuss what to do about this.  I have routed the blood tests results to Dr. Julien Nordmann.  Being more anemic can make her feel more short of breath.  The remainder of her lab work was unremarkable.

## 2022-05-06 NOTE — Telephone Encounter (Signed)
Received MyChart message from patient requesting the results from her recent CXR and lab work.   Dr. Halford Chessman, can you please advise? Thanks!

## 2022-05-07 ENCOUNTER — Other Ambulatory Visit: Payer: Self-pay | Admitting: Internal Medicine

## 2022-05-08 ENCOUNTER — Encounter: Payer: Self-pay | Admitting: Internal Medicine

## 2022-05-08 ENCOUNTER — Emergency Department (HOSPITAL_COMMUNITY)
Admission: EM | Admit: 2022-05-08 | Discharge: 2022-05-08 | Discharge status: Against Medical Advice | Payer: Medicare Other

## 2022-05-08 ENCOUNTER — Ambulatory Visit: Payer: Medicare Other | Attending: Internal Medicine | Admitting: Internal Medicine

## 2022-05-08 VITALS — BP 98/63 | HR 63 | Ht 65.0 in | Wt 300.4 lb

## 2022-05-08 DIAGNOSIS — R1084 Generalized abdominal pain: Secondary | ICD-10-CM | POA: Diagnosis not present

## 2022-05-08 DIAGNOSIS — R0602 Shortness of breath: Secondary | ICD-10-CM | POA: Diagnosis not present

## 2022-05-08 DIAGNOSIS — D509 Iron deficiency anemia, unspecified: Secondary | ICD-10-CM | POA: Insufficient documentation

## 2022-05-08 DIAGNOSIS — T68XXXA Hypothermia, initial encounter: Secondary | ICD-10-CM | POA: Diagnosis not present

## 2022-05-08 DIAGNOSIS — R42 Dizziness and giddiness: Secondary | ICD-10-CM | POA: Diagnosis not present

## 2022-05-08 DIAGNOSIS — R0689 Other abnormalities of breathing: Secondary | ICD-10-CM | POA: Diagnosis not present

## 2022-05-08 NOTE — ED Notes (Signed)
Patient left ED. Patient encouraged to stay. Patient declined. IV discontinued.

## 2022-05-08 NOTE — Patient Instructions (Signed)
Medication Instructions:  Your physician recommends that you continue on your current medications as directed. Please refer to the Current Medication list given to you today.  *If you need a refill on your cardiac medications before your next appointment, please call your pharmacy*   Lab Work: NONE ordered at this time of appointment   If you have labs (blood work) drawn today and your tests are completely normal, you will receive your results only by: [place] (if you have MyChart) OR A paper copy in the mail If you have any lab test that is abnormal or we need to change your treatment, we will call you to review the results.   Testing/Procedures: NONE ordered at this time of appointment     Follow-Up: At [facility], you and your health needs are our priority.  As part of our continuing mission to provide you with exceptional heart care, we have created designated Provider Care Teams.  These Care Teams include your primary Cardiologist (physician) and Advanced Practice Providers (APPs -  Physician Assistants and Nurse Practitioners) who all work together to provide you with the care you need, when you need it.  We recommend signing up for the patient portal called "MyChart".  Sign up information is provided on this After Visit Summary.  MyChart is used to connect with patients for Virtual Visits (Telemedicine).  Patients are able to view lab/test results, encounter notes, upcoming appointments, etc.  Non-urgent messages can be sent to your provider as well.   To learn more about what you can do with MyChart, go to [email or web address].    Your next appointment:   6 month(s)  The format for your next appointment:   In Person  Provider:   Janina Mayo, MD     Other Instructions   Important Information About Sugar

## 2022-05-08 NOTE — Progress Notes (Signed)
Cardiology Office Note:    Date:  05/08/2022   ID:  Tamara Daugherty, Tamara Daugherty 04, 1952, MRN [id number]  PCP:  Eber Hong, MD   [place] [facility] Cardiologist:  Janina Mayo, MD     Referring MD: Eber Hong, MD   No chief complaint on file. Leg cramping/numbess  History of Present Illness:    Tamara Daugherty is a 71 y.o. female with a hx of asthma, GERD, TIA, hypothyroidism, hypertension, sleep apnea on CPAP, obesity, iron deficiency anemia on IV iron, referral for "numbness"  She notes June 3rd, she was sitting on the toilet. She notes leg tightening c/w a cramp. It was numb. This has resolved.  She noted a brief moment of  blanking out. She feels better now. Labs in March 2023 showed normal crt, K.  She was not concerned but her niece suggest she be evaluated  Saw Dr. Bronson Ing in 2018. She has hx of normal LV function, mild MR. Noted cath 20 years ago that was normal. Recommendation at that time was for management of sleep apnea.   Her blood pressure is well controlled.  Interim 05/08/2022 3 weeks getting progressive SOB. Saw pulmonologist. Not typical of asthma. No hx of decompensated HF hospitalization. Having sciatic leg pain. Notes sleeps with 2 pillows.  Was on torsemide PRN with some leg swelling in the past  Notes some bleeding that was bright red from her rectum, but this has stopped. She has felt very weak today and struggled to get out of her seat.  BP was low.  Hgb 12->8 since July.    Wt Readings from Last 3 Encounters:  05/08/22 (!) 300 lb 6.4 oz (136.3 kg)  05/02/22 297 lb (134.7 kg)  04/17/22 295 lb 3.2 oz (133.9 kg)     Past Medical History:  Diagnosis Date   Allergy    Anemia    takes Iron daily   Anxiety    Arthritis    Asthma    Symbicort daily and Albuterol daily as needed   B12 deficiency    Back pain    Bilateral swelling of feet    Bruises easily    d/t being on Plavix    Chest pain    Complication of anesthesia    long time to  wake up - referred to pulmonary after carpel tunnel procedure - for asthma and undiagnosed sleep apnea   Constipation    Depression    GERD (gastroesophageal reflux disease)    takes Protonix daily   Headache    Heart murmur    [facility] [place] [facility]   Heart murmur    History of blood transfusion    no abnormal reaction noted   History of colon polyps    beningn   History of migraine    couple of wks ago was the last one   Hyperlipidemia    takes Atorvastatin daily   Hypertension    takes Benicar and Diltiazem daily   Hypothyroidism    takes Synthroid daily   Itching    takes Atarax daily as needed   Joint pain    Joint pain    Joint swelling    Lactose intolerance    Leg swelling    Liver cyst    Mini stroke    Multiple allergies    takes Claritin daily as needed as well as using Flonase if needed   Muscle spasm    takes Baclofen if needed   Osteoarthritis  Peripheral edema    takes Torsemide daily   Pneumonia    hx of   Prediabetes    Primary localized osteoarthritis of left knee 06/21/2014   Primary localized osteoarthritis of right knee 03/21/2015   Restrictive lung disease    Sleep apnea    cpap   Sleep apnea    Swallowing difficulty    TIA (transient ischemic attack)    takes Plavix daily;notices right side is slightly weaker than left   Vertigo    takes Meclizine daily as needed   Vitamin D deficiency     Past Surgical History:  Procedure Laterality Date   ABDOMINAL HYSTERECTOMY     BACK SURGERY     BREAST SURGERY     cyst removal   CARDIAC CATHETERIZATION     carpel tunnel Right    COLONOSCOPY     ESOPHAGOGASTRODUODENOSCOPY     KNEE ARTHROSCOPY     TONSILLECTOMY     TOTAL KNEE ARTHROPLASTY Left 06/21/2014   Procedure: LEFT TOTAL KNEE ARTHROPLASTY;  Surgeon: Johnny Bridge, MD;  Location: [facility];  Service: Orthopedics;  Laterality: Left;   TOTAL KNEE ARTHROPLASTY Right 03/21/2015   Procedure: TOTAL KNEE ARTHROPLASTY STEROID INECTION  BOTH KNEES;  Surgeon: Marchia Bond, MD;  Location: [facility];  Service: Orthopedics;  Laterality: Right;   trigger thumb      Current Medications: Current Meds  Medication Sig   albuterol (VENTOLIN HFA) 108 (90 Base) MCG/ACT inhaler Inhale 2 puffs into the lungs every 6 (six) hours as needed for wheezing or shortness of breath.   atorvastatin (LIPITOR) 10 MG tablet Take 10 mg by mouth daily.   budesonide (PULMICORT) 0.5 MG/2ML nebulizer solution USE 1 VIAL  IN  NEBULIZER TWICE  DAILY - At 10 AM And 5 PM - Rinse Mouth After Treatment   budesonide-formoterol (SYMBICORT) 160-4.5 MCG/ACT inhaler Inhale 2 puffs into the lungs in the morning and at bedtime.   clopidogrel (PLAVIX) 75 MG tablet Take 1 tablet by mouth daily.   diltiazem (CARDIZEM CD) 240 MG 24 hr capsule Take 240 mg by mouth daily.   EPINEPHrine 0.3 mg/0.3 mL IJ SOAJ injection Inject 0.3 mg into the muscle once.   fluticasone (FLONASE) 50 MCG/ACT nasal spray Place 1 spray into both nostrils 2 (two) times daily as needed for allergies or rhinitis.   formoterol (PERFOROMIST) 20 MCG/2ML nebulizer solution USE 1 VIAL  IN  NEBULIZER TWICE  DAILY - - Morning And Evening   guaiFENesin (MUCINEX) 600 MG 12 hr tablet Take 600 mg by mouth 2 (two) times daily as needed for to loosen phlegm.   hydrALAZINE (APRESOLINE) 25 MG tablet TAKE 1 TABLET BY MOUTH EVERY DAY IN THE MORNING AND AT BEDTIME   ipratropium-albuterol (DUONEB) 0.5-2.5 (3) MG/3ML SOLN Inhale into the lungs.   Iron-Vitamin C (VITRON-C) 65-125 MG TABS Take by mouth.   levothyroxine (SYNTHROID, LEVOTHROID) 50 MCG tablet Take 50 mcg by mouth daily before breakfast.   loratadine (CLARITIN) 10 MG tablet Take 10 mg by mouth daily as needed for allergies.   metoprolol succinate (TOPROL-XL) 50 MG 24 hr tablet Take 50 mg by mouth daily. Take with or immediately following a meal.   montelukast (SINGULAIR) 10 MG tablet Take 1 tablet (10 mg total) by mouth at bedtime.   Multiple Vitamins-Minerals  (MULTIVITAMIN WITH MINERALS) tablet Take 1 tablet by mouth daily.   olmesartan-hydrochlorothiazide (BENICAR HCT) 40-25 MG per tablet Take 1 tablet by mouth daily.   Omega-3 Fatty Acids (FISH  OIL PO) Take 1 capsule by mouth 2 (two) times daily.   pantoprazole (PROTONIX) 40 MG tablet Take 40 mg by mouth daily.   potassium chloride (MICRO-K) 10 MEQ CR capsule Take 30 mEq by mouth daily.    pyridOXINE (B-6) 50 MG tablet Take 50 mg by mouth daily.   Spacer/Aero-Holding Chambers (AEROCHAMBER MV) inhaler Use as instructed   torsemide (DEMADEX) 20 MG tablet Take 1 tablet (20 mg total) by mouth as needed.   TURMERIC PO Take 1 capsule by mouth daily.   Vitamin D, Ergocalciferol, (DRISDOL) 1.25 MG (50000 UNIT) CAPS capsule Take 1 capsule (50,000 Units total) by mouth every 7 (seven) days.     Allergies:   Bee venom, Diphenhydramine hcl, Diphenhydramine hcl, Percocet [oxycodone-acetaminophen], Propoxyphene, Sulfa antibiotics, Doxycycline, Oxycodone-acetaminophen, and Penicillins   Social History   Socioeconomic History   Marital status: Married    Spouse name: Not on file   Number of children: Not on file   Years of education: Not on file   Highest education level: Not on file  Occupational History   Occupation: retired  Tobacco Use   Smoking status: Never    Passive exposure: Yes   Smokeless tobacco: Never   Tobacco comments:    Father smoked briefly.   Vaping Use   Vaping Use: Never used  Substance and Sexual Activity   Alcohol use: Yes    Comment: social   Drug use: No   Sexual activity: Not on file  Other Topics Concern   Not on file  Social History Narrative   [facility] (08/30/16):   Originally from [place]. 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 No pets currently. No bird, mold, or hot tub exposure. Does have indoor plants. No draperies. Does have carpet in the bedroom. No down  bedding that she is aware of.    Social Determinants of Health   Financial Resource Strain: Not on file  Food Insecurity: Not on file  Transportation Needs: Not on file  Physical Activity: Not on file  Stress: Not on file  Social Connections: Not on file     Family History: The patient's family history includes Cancer in her cousin, father, and maternal aunt; Heart disease in her mother and paternal aunt; Hyperlipidemia in her mother; Hypertension in her mother; Lung cancer in her father and paternal uncle; Rheum arthritis in her mother; Scleroderma in her mother; Stroke in her brother. There is no history of Lung disease. Brother died of MI at age 19. He had untreated hypertension, was a smoker, and had a stroke  ROS:   Please see the history of present illness.     All other systems reviewed and are negative.  EKGs/Labs/Other Studies Reviewed:    The following studies were reviewed today:   EKG:  EKG is  ordered today.  The ekg ordered today demonstrates  12/04/2021-NSR, 1st degree AV block,LAD, inferior Q. Poor r wave progression  05/08/2022- ECG unchanged  Recent Labs: 09/07/2021: TSH 1.06 05/02/2022: ALT 16; BNP 112.1; BUN 13; Creatinine, Ser 1.03; Hemoglobin 8.1; Platelets 446; Potassium 4.3; Sodium 133   Recent Lipid Panel    Component Value Date/Time   CHOL 154 05/15/2020 1129   TRIG 49 05/15/2020 1129   HDL 66 05/15/2020 1129   LDLCALC 77 05/15/2020 1129     Risk Assessment/Calculations:           Physical Exam:  VS:   Vitals:   05/08/22 1532  BP: 98/63  Pulse: 63  SpO2: 95%      Wt Readings from Last 3 Encounters:  05/08/22 (!) 300 lb 6.4 oz (136.3 kg)  05/02/22 297 lb (134.7 kg)  04/17/22 295 lb 3.2 oz (133.9 kg)     GEN:  Well nourished, well developed in no acute distress HEENT: Normal NECK: No JVD; No carotid bruits LYMPHATICS: No lymphadenopathy CARDIAC: RRR, no murmurs, rubs, gallops RESPIRATORY:  Clear to auscultation without  rales, wheezing or rhonchi  ABDOMEN: Soft, non-tender, non-distended MUSCULOSKELETAL:  No edema; No deformity  SKIN: Warm and dry NEUROLOGIC:  Alert and oriented x 3 PSYCHIATRIC:  Normal affect   ASSESSMENT:    SOB: c/f symptomatic anemia hgb 12->8 from 4 months ago. Described BRBPR. Has hx of iron deficiency anemia.  BNP was mild. No LE pitting edema. Weight relatively stable.   Anemia: blood loss. Can stop her plavix.  Pulmonary HTN: RVSP 35 mmHg. Sleep apnea on CPAP  Mild  MR: 12/26/2021. Repeat surveillance 2-3 years  Hx TIA: She noted blanking out, unclear if minor TIA. BL carotid US was nl. Can continue plavix per prior plan for TIA. Continue statin.  HTN: continue cardizem 240 mg daily,metoprolol 50 mg XL, olmesartan -HCTz 40-25 mg, hydralazine 25 mg BID. Doing well with this regimen, will continue  PLAN:    In order of problems listed above:  EMS to ED for blood transfusion Follow up in 6 months        Medication Adjustments/Labs and Tests Ordered: Current medicines are reviewed at length with the patient today.  Concerns regarding medicines are outlined above.  Orders Placed This Encounter  Procedures   EKG 12-Lead   No orders of the defined types were placed in this encounter.   Patient Instructions  Medication Instructions:  Your physician recommends that you continue on your current medications as directed. Please refer to the Current Medication list given to you today.  *If you need a refill on your cardiac medications before your next appointment, please call your pharmacy*   Lab Work: NONE ordered at this time of appointment   If you have labs (blood work) drawn today and your tests are completely normal, you will receive your results only by: [place] (if you have MyChart) OR A paper copy in the mail If you have any lab test that is abnormal or we need to change your treatment, we will call you to review the  results.   Testing/Procedures: NONE ordered at this time of appointment     Follow-Up: At [facility], you and your health needs are our priority.  As part of our continuing mission to provide you with exceptional heart care, we have created designated Provider Care Teams.  These Care Teams include your primary Cardiologist (physician) and Advanced Practice Providers (APPs -  Physician Assistants and Nurse Practitioners) who all work together to provide you with the care you need, when you need it.  We recommend signing up for the patient portal called "MyChart".  Sign up information is provided on this After Visit Summary.  MyChart is used to connect with patients for Virtual Visits (Telemedicine).  Patients are able to view lab/test results, encounter notes, upcoming appointments, etc.  Non-urgent messages can be sent to your provider as well.   To learn more about what you can do with MyChart, go to [email or web address].    Your next appointment:   6 month(s)  The format for your next appointment:   In Person  Provider:   Janina Mayo, MD     Other Instructions   Important Information About Sugar         Signed, Janina Mayo, MD  05/08/2022 5:51 PM    [place]

## 2022-05-08 NOTE — ED Triage Notes (Signed)
Pt bib ems from cardiologist with reports of feeling shob lightheaded dizzy X2 weeks. Generalized weakness as well. Cardiology concerned about anemia. Last hgb was 8 down from 12 several months ago. Pt also with bil edema to feet. Denies chest pain. Endorses diarrhea a few weeks ago with BRB lasting 4 days.  VSS with ems.

## 2022-05-13 ENCOUNTER — Inpatient Hospital Stay: Payer: Medicare Other | Attending: Internal Medicine

## 2022-05-13 ENCOUNTER — Telehealth: Payer: Self-pay | Admitting: Pharmacy Technician

## 2022-05-13 ENCOUNTER — Encounter: Payer: Self-pay | Admitting: Internal Medicine

## 2022-05-13 ENCOUNTER — Other Ambulatory Visit: Payer: Self-pay | Admitting: Internal Medicine

## 2022-05-13 ENCOUNTER — Telehealth: Payer: Self-pay | Admitting: Medical Oncology

## 2022-05-13 ENCOUNTER — Other Ambulatory Visit: Payer: Self-pay | Admitting: Pharmacy Technician

## 2022-05-13 ENCOUNTER — Inpatient Hospital Stay (HOSPITAL_BASED_OUTPATIENT_CLINIC_OR_DEPARTMENT_OTHER): Payer: Medicare Other | Admitting: Internal Medicine

## 2022-05-13 ENCOUNTER — Telehealth: Payer: Self-pay

## 2022-05-13 VITALS — BP 128/67 | HR 60 | Temp 98.5°F | Resp 16 | Ht 65.0 in | Wt 297.6 lb

## 2022-05-13 DIAGNOSIS — D500 Iron deficiency anemia secondary to blood loss (chronic): Secondary | ICD-10-CM | POA: Diagnosis not present

## 2022-05-13 DIAGNOSIS — D509 Iron deficiency anemia, unspecified: Secondary | ICD-10-CM | POA: Insufficient documentation

## 2022-05-13 DIAGNOSIS — K912 Postsurgical malabsorption, not elsewhere classified: Secondary | ICD-10-CM | POA: Diagnosis not present

## 2022-05-13 DIAGNOSIS — Z79899 Other long term (current) drug therapy: Secondary | ICD-10-CM | POA: Insufficient documentation

## 2022-05-13 DIAGNOSIS — R531 Weakness: Secondary | ICD-10-CM | POA: Insufficient documentation

## 2022-05-13 DIAGNOSIS — R5383 Other fatigue: Secondary | ICD-10-CM | POA: Diagnosis not present

## 2022-05-13 LAB — CBC WITH DIFFERENTIAL (CANCER CENTER ONLY)
Abs Immature Granulocytes: 0 10*3/uL (ref 0.00–0.07)
Basophils Absolute: 0 10*3/uL (ref 0.0–0.1)
Basophils Relative: 1 %
Eosinophils Absolute: 0 10*3/uL (ref 0.0–0.5)
Eosinophils Relative: 0 %
HCT: 25.4 % — ABNORMAL LOW (ref 36.0–46.0)
Hemoglobin: 8.3 g/dL — ABNORMAL LOW (ref 12.0–15.0)
Immature Granulocytes: 0 %
Lymphocytes Relative: 40 %
Lymphs Abs: 2 10*3/uL (ref 0.7–4.0)
MCH: 28.4 pg (ref 26.0–34.0)
MCHC: 32.7 g/dL (ref 30.0–36.0)
MCV: 87 fL (ref 80.0–100.0)
Monocytes Absolute: 0.6 10*3/uL (ref 0.1–1.0)
Monocytes Relative: 12 %
Neutro Abs: 2.3 10*3/uL (ref 1.7–7.7)
Neutrophils Relative %: 47 %
Platelet Count: 356 10*3/uL (ref 150–400)
RBC: 2.92 MIL/uL — ABNORMAL LOW (ref 3.87–5.11)
RDW: 15.4 % (ref 11.5–15.5)
WBC Count: 4.9 10*3/uL (ref 4.0–10.5)
nRBC: 0 % (ref 0.0–0.2)

## 2022-05-13 LAB — FERRITIN: Ferritin: 5 ng/mL — ABNORMAL LOW (ref 11–307)

## 2022-05-13 LAB — IRON AND IRON BINDING CAPACITY (CC-WL,HP ONLY)
Iron: 23 ug/dL — ABNORMAL LOW (ref 28–170)
Saturation Ratios: 5 % — ABNORMAL LOW (ref 10.4–31.8)
TIBC: 497 ug/dL — ABNORMAL HIGH (ref 250–450)
UIBC: 474 ug/dL — ABNORMAL HIGH (ref 148–442)

## 2022-05-13 NOTE — Telephone Encounter (Signed)
Attempted to call patient on all numbers listed in chart to see how she is doing at the request of Dr. Harl Bowie. Patient left ER without being seen. Unable to reach patient- left message to call back.   Per Dr. Nilda Calamity you call and see how she is? If she is feeling better, she can have labs done here; CBC , BMET. I can let at least know her hgb and send to her heme/onc provider. If she still feels unwell, we continue to recommend going to the ED. Can try [employer].

## 2022-05-13 NOTE — Telephone Encounter (Signed)
Records faxed to Dr Cato Mulligan.

## 2022-05-13 NOTE — Telephone Encounter (Addendum)
Dr. Julien Nordmann,  Medicare will not cover Venofer for IDA. Perferred medication is Feraheme. Would you like to try Feraheme?

## 2022-05-13 NOTE — Progress Notes (Signed)
Refaxed records to a different fax number [phone].

## 2022-05-13 NOTE — Addendum Note (Signed)
Addended by: Ardeen Garland on: 05/13/2022 03:58 PM   Modules accepted: Orders

## 2022-05-13 NOTE — Progress Notes (Signed)
[place] [phone]   [phone]  OFFICE PROGRESS NOTE  Eber Hong, [place] [address]  DIAGNOSIS: Persistent iron deficiency anemia with no clear etiology except for possibility of dietary insufficiency and the patient did not have any improvement in her anemia with the oral iron tablet  PRIOR THERAPY: Iron infusion with Venofer 300 mg IV weekly for 3 weeks.  CURRENT THERAPY: Vitron-C 1 tablet p.o. daily.  INTERVAL HISTORY: Tamara Daugherty 71 y.o. female returns to the clinic today for follow-up visit.  The patient is complaining of increasing fatigue and weakness.  She mentions that she has 4 days of gastrointestinal blood loss but this improved.  She went to the emergency department for evaluation but she left without being seen.  She continues to have shortness of breath with exertion.  She has no current nausea, vomiting, diarrhea or constipation.  She has no headache or visual changes.  She has no recent weight loss or night sweats.  She is here today for evaluation with repeat blood work.  MEDICAL HISTORY: Past Medical History:  Diagnosis Date   Allergy    Anemia    takes Iron daily   Anxiety    Arthritis    Asthma    Symbicort daily and Albuterol daily as needed   B12 deficiency    Back pain    Bilateral swelling of feet    Bruises easily    d/t being on Plavix    Chest pain    Complication of anesthesia    long time to wake up - referred to pulmonary after carpel tunnel procedure - for asthma and undiagnosed sleep apnea   Constipation    Depression    GERD (gastroesophageal reflux disease)    takes Protonix daily   Headache    Heart murmur    [facility] [facility]   Heart murmur    History of blood transfusion    no abnormal reaction noted   History of colon polyps    beningn   History of migraine    couple of wks ago was the last one   Hyperlipidemia    takes Atorvastatin daily    Hypertension    takes Benicar and Diltiazem daily   Hypothyroidism    takes Synthroid daily   Itching    takes Atarax daily as needed   Joint pain    Joint pain    Joint swelling    Lactose intolerance    Leg swelling    Liver cyst    Mini stroke    Multiple allergies    takes Claritin daily as needed as well as using Flonase if needed   Muscle spasm    takes Baclofen if needed   Osteoarthritis    Peripheral edema    takes Torsemide daily   Pneumonia    hx of   Prediabetes    Primary localized osteoarthritis of left knee 06/21/2014   Primary localized osteoarthritis of right knee 03/21/2015   Restrictive lung disease    Sleep apnea    cpap   Sleep apnea    Swallowing difficulty    TIA (transient ischemic attack)    takes Plavix daily;notices right side is slightly weaker than left   Vertigo    takes Meclizine daily as needed   Vitamin D deficiency     ALLERGIES:  is allergic to bee venom, diphenhydramine hcl, diphenhydramine hcl, percocet [oxycodone-acetaminophen], propoxyphene, sulfa  antibiotics, doxycycline, oxycodone-acetaminophen, and penicillins.  MEDICATIONS:  Current Outpatient Medications  Medication Sig Dispense Refill   albuterol (VENTOLIN HFA) 108 (90 Base) MCG/ACT inhaler Inhale 2 puffs into the lungs every 6 (six) hours as needed for wheezing or shortness of breath. 18 g 3   atorvastatin (LIPITOR) 10 MG tablet Take 10 mg by mouth daily.     budesonide (PULMICORT) 0.5 MG/2ML nebulizer solution USE 1 VIAL  IN  NEBULIZER TWICE  DAILY - At 10 AM And 5 PM - Rinse Mouth After Treatment 120 mL 11   budesonide-formoterol (SYMBICORT) 160-4.5 MCG/ACT inhaler Inhale 2 puffs into the lungs in the morning and at bedtime. 1 each 12   clopidogrel (PLAVIX) 75 MG tablet Take 1 tablet by mouth daily.     diltiazem (CARDIZEM CD) 240 MG 24 hr capsule Take 240 mg by mouth daily.     EPINEPHrine 0.3 mg/0.3 mL IJ SOAJ injection Inject 0.3 mg into the muscle once.     fluticasone  (FLONASE) 50 MCG/ACT nasal spray Place 1 spray into both nostrils 2 (two) times daily as needed for allergies or rhinitis.     formoterol (PERFOROMIST) 20 MCG/2ML nebulizer solution USE 1 VIAL  IN  NEBULIZER TWICE  DAILY - - Morning And Evening 120 mL 11   guaiFENesin (MUCINEX) 600 MG 12 hr tablet Take 600 mg by mouth 2 (two) times daily as needed for to loosen phlegm.     hydrALAZINE (APRESOLINE) 25 MG tablet TAKE 1 TABLET BY MOUTH EVERY DAY IN THE MORNING AND AT BEDTIME     ipratropium-albuterol (DUONEB) 0.5-2.5 (3) MG/3ML SOLN Inhale into the lungs.     Iron-Vitamin C (VITRON-C) 65-125 MG TABS Take by mouth.     levothyroxine (SYNTHROID, LEVOTHROID) 50 MCG tablet Take 50 mcg by mouth daily before breakfast.     loratadine (CLARITIN) 10 MG tablet Take 10 mg by mouth daily as needed for allergies.     metoprolol succinate (TOPROL-XL) 50 MG 24 hr tablet Take 50 mg by mouth daily. Take with or immediately following a meal.     montelukast (SINGULAIR) 10 MG tablet Take 1 tablet (10 mg total) by mouth at bedtime. 90 tablet 3   Multiple Vitamins-Minerals (MULTIVITAMIN WITH MINERALS) tablet Take 1 tablet by mouth daily.     olmesartan-hydrochlorothiazide (BENICAR HCT) 40-25 MG per tablet Take 1 tablet by mouth daily.     Omega-3 Fatty Acids (FISH OIL PO) Take 1 capsule by mouth 2 (two) times daily.     pantoprazole (PROTONIX) 40 MG tablet Take 40 mg by mouth daily.     potassium chloride (MICRO-K) 10 MEQ CR capsule Take 30 mEq by mouth daily.      pyridOXINE (B-6) 50 MG tablet Take 50 mg by mouth daily.     Spacer/Aero-Holding Chambers (AEROCHAMBER MV) inhaler Use as instructed 1 each 0   torsemide (DEMADEX) 20 MG tablet Take 1 tablet (20 mg total) by mouth as needed. 30 tablet 1   TURMERIC PO Take 1 capsule by mouth daily.     Vitamin D, Ergocalciferol, (DRISDOL) 1.25 MG (50000 UNIT) CAPS capsule Take 1 capsule (50,000 Units total) by mouth every 7 (seven) days. 4 capsule 0   No current  facility-administered medications for this visit.    SURGICAL HISTORY:  Past Surgical History:  Procedure Laterality Date   ABDOMINAL HYSTERECTOMY     BACK SURGERY     BREAST SURGERY     cyst removal   CARDIAC CATHETERIZATION  carpel tunnel Right    COLONOSCOPY     ESOPHAGOGASTRODUODENOSCOPY     KNEE ARTHROSCOPY     TONSILLECTOMY     TOTAL KNEE ARTHROPLASTY Left 06/21/2014   Procedure: LEFT TOTAL KNEE ARTHROPLASTY;  Surgeon: Johnny Bridge, MD;  Location: [facility];  Service: Orthopedics;  Laterality: Left;   TOTAL KNEE ARTHROPLASTY Right 03/21/2015   Procedure: TOTAL KNEE ARTHROPLASTY STEROID INECTION BOTH KNEES;  Surgeon: Marchia Bond, MD;  Location: [facility];  Service: Orthopedics;  Laterality: Right;   trigger thumb      REVIEW OF SYSTEMS:  A comprehensive review of systems was negative except for: Constitutional: positive for fatigue Respiratory: positive for dyspnea on exertion Musculoskeletal: positive for arthralgias   PHYSICAL EXAMINATION: General appearance: alert, cooperative, fatigued, and no distress Head: Normocephalic, without obvious abnormality, atraumatic Neck: no adenopathy, no JVD, supple, symmetrical, trachea midline, and thyroid not enlarged, symmetric, no tenderness/mass/nodules Lymph nodes: Cervical, supraclavicular, and axillary nodes normal. Resp: clear to auscultation bilaterally Back: symmetric, no curvature. ROM normal. No CVA tenderness. Cardio: regular rate and rhythm, S1, S2 normal, no murmur, click, rub or gallop GI: soft, non-tender; bowel sounds normal; no masses,  no organomegaly Extremities: extremities normal, atraumatic, no cyanosis or edema  ECOG PERFORMANCE STATUS: 1 - Symptomatic but completely ambulatory  Blood pressure 128/67, pulse 60, temperature 98.5 F (36.9 C), temperature source Oral, resp. rate 16, height '5\' 5"'$  (1.651 m), weight 297 lb 9.6 oz (135 kg), SpO2 97 %.  LABORATORY DATA: Lab Results  Component Value Date   WBC 4.9  05/13/2022   HGB 8.3 (L) 05/13/2022   HCT 25.4 (L) 05/13/2022   MCV 87.0 05/13/2022   PLT 356 05/13/2022      Chemistry      Component Value Date/Time   NA 133 (L) 05/02/2022 1304   K 4.3 05/02/2022 1304   CL 95 (L) 05/02/2022 1304   CO2 26 05/02/2022 1304   BUN 13 05/02/2022 1304   CREATININE 1.03 (H) 05/02/2022 1304   CREATININE 1.09 (H) 11/01/2020 1115      Component Value Date/Time   CALCIUM 9.3 05/02/2022 1304   ALKPHOS 49 05/02/2022 1304   AST 16 05/02/2022 1304   AST 14 (L) 11/01/2020 1115   ALT 16 05/02/2022 1304   ALT 12 11/01/2020 1115   BILITOT 0.2 05/02/2022 1304   BILITOT 0.3 11/01/2020 1115       RADIOGRAPHIC STUDIES: DG Chest 2 View  Result Date: 05/05/2022 CLINICAL DATA:  Dyspnea x1 week. EXAM: CHEST - 2 VIEW COMPARISON:  None Available. FINDINGS: The cardiac silhouette is mildly enlarged and unchanged in size. Both lungs are clear. The visualized skeletal structures are unremarkable. IMPRESSION: Mild cardiomegaly without evidence of acute or active cardiopulmonary disease. Electronically Signed   By: Virgina Norfolk M.D.   On: 05/05/2022 21:04    ASSESSMENT AND PLAN: This is a very pleasant 71 years old African-American female with iron deficiency anemia secondary to malabsorption and lack of benefit to the oral iron tablets. The patient was treated with iron infusion with Venofer 300 mg IV weekly for 3 weeks and tolerated her infusion fairly well. The patient has been complaining of increasing fatigue and weakness recently.  She was complaining of gastrointestinal bleeding for around 4 days recently but she was not seen by gastroenterology. She was seen in the past by Dr. Theodosia Blender gastroenterology in [place]. We will call his office to see the patient as soon as possible for evaluation of her gastrointestinal  blood loss. For the iron deficiency, I will arrange for the patient to receive iron infusion with Venofer 300 Mg IV weekly for 3 weeks.   Hopefully will start the first dose of this treatment this week. I will see the patient back for follow-up visit in 2 months for evaluation and repeat blood work. She was advised to call immediately if she has any other concerning symptoms in the interval. The patient voices understanding of current disease status and treatment options and is in agreement with the current care plan.  All questions were answered. The patient knows to call the clinic with any problems, questions or concerns. We can certainly see the patient much sooner if necessary.   Disclaimer: This note was dictated with voice recognition software. Similar sounding words can inadvertently be transcribed and may not be corrected upon review.

## 2022-05-15 DIAGNOSIS — K625 Hemorrhage of anus and rectum: Secondary | ICD-10-CM | POA: Diagnosis not present

## 2022-05-15 DIAGNOSIS — K529 Noninfective gastroenteritis and colitis, unspecified: Secondary | ICD-10-CM | POA: Diagnosis not present

## 2022-05-15 DIAGNOSIS — D509 Iron deficiency anemia, unspecified: Secondary | ICD-10-CM | POA: Diagnosis not present

## 2022-05-17 MED FILL — Ferumoxytol Inj 510 MG/17ML (30 MG/ML) (Elemental Fe): INTRAVENOUS | Qty: 17 | Status: AC

## 2022-05-18 ENCOUNTER — Inpatient Hospital Stay: Payer: Medicare Other

## 2022-05-18 ENCOUNTER — Other Ambulatory Visit: Payer: Self-pay

## 2022-05-18 VITALS — BP 116/57 | HR 62 | Temp 98.7°F | Resp 18

## 2022-05-18 DIAGNOSIS — Z79899 Other long term (current) drug therapy: Secondary | ICD-10-CM | POA: Diagnosis not present

## 2022-05-18 DIAGNOSIS — K912 Postsurgical malabsorption, not elsewhere classified: Secondary | ICD-10-CM | POA: Diagnosis not present

## 2022-05-18 DIAGNOSIS — D509 Iron deficiency anemia, unspecified: Secondary | ICD-10-CM | POA: Diagnosis not present

## 2022-05-18 DIAGNOSIS — D508 Other iron deficiency anemias: Secondary | ICD-10-CM

## 2022-05-18 DIAGNOSIS — R531 Weakness: Secondary | ICD-10-CM | POA: Diagnosis not present

## 2022-05-18 DIAGNOSIS — R5383 Other fatigue: Secondary | ICD-10-CM | POA: Diagnosis not present

## 2022-05-18 MED ORDER — SODIUM CHLORIDE 0.9 % IV SOLN
510.0000 mg | Freq: Once | INTRAVENOUS | Status: AC
  Administered 2022-05-18: 510 mg via INTRAVENOUS
  Filled 2022-05-18: qty 510

## 2022-05-18 MED ORDER — LORATADINE 10 MG PO TABS
10.0000 mg | ORAL_TABLET | Freq: Once | ORAL | Status: AC
  Administered 2022-05-18: 10 mg via ORAL
  Filled 2022-05-18: qty 1

## 2022-05-18 MED ORDER — SODIUM CHLORIDE 0.9 % IV SOLN: Freq: Once | INTRAVENOUS | Status: AC

## 2022-05-18 NOTE — Patient Instructions (Signed)

## 2022-05-22 ENCOUNTER — Ambulatory Visit: Payer: PRIVATE HEALTH INSURANCE

## 2022-05-22 DIAGNOSIS — K449 Diaphragmatic hernia without obstruction or gangrene: Secondary | ICD-10-CM | POA: Diagnosis not present

## 2022-05-22 DIAGNOSIS — K922 Gastrointestinal hemorrhage, unspecified: Secondary | ICD-10-CM | POA: Diagnosis not present

## 2022-05-22 DIAGNOSIS — D130 Benign neoplasm of esophagus: Secondary | ICD-10-CM | POA: Diagnosis not present

## 2022-05-22 DIAGNOSIS — D124 Benign neoplasm of descending colon: Secondary | ICD-10-CM | POA: Diagnosis not present

## 2022-05-22 DIAGNOSIS — Z86010 Personal history of colonic polyps: Secondary | ICD-10-CM | POA: Diagnosis not present

## 2022-05-22 DIAGNOSIS — D649 Anemia, unspecified: Secondary | ICD-10-CM | POA: Diagnosis not present

## 2022-05-22 DIAGNOSIS — Z1211 Encounter for screening for malignant neoplasm of colon: Secondary | ICD-10-CM | POA: Diagnosis not present

## 2022-05-22 DIAGNOSIS — K635 Polyp of colon: Secondary | ICD-10-CM | POA: Diagnosis not present

## 2022-05-22 DIAGNOSIS — D509 Iron deficiency anemia, unspecified: Secondary | ICD-10-CM | POA: Diagnosis not present

## 2022-05-24 ENCOUNTER — Inpatient Hospital Stay: Payer: Medicare Other | Attending: Internal Medicine

## 2022-05-24 VITALS — BP 109/60 | HR 61 | Temp 98.2°F | Resp 18 | Wt 294.1 lb

## 2022-05-24 DIAGNOSIS — K912 Postsurgical malabsorption, not elsewhere classified: Secondary | ICD-10-CM | POA: Diagnosis not present

## 2022-05-24 DIAGNOSIS — D508 Other iron deficiency anemias: Secondary | ICD-10-CM | POA: Diagnosis not present

## 2022-05-24 MED ORDER — SODIUM CHLORIDE 0.9 % IV SOLN
510.0000 mg | Freq: Once | INTRAVENOUS | Status: AC
  Administered 2022-05-24: 510 mg via INTRAVENOUS
  Filled 2022-05-24: qty 510

## 2022-05-24 MED ORDER — SODIUM CHLORIDE 0.9 % IV SOLN: Freq: Once | INTRAVENOUS | Status: AC

## 2022-05-24 MED ORDER — LORATADINE 10 MG PO TABS
10.0000 mg | ORAL_TABLET | Freq: Once | ORAL | Status: AC
  Administered 2022-05-24: 10 mg via ORAL
  Filled 2022-05-24: qty 1

## 2022-05-24 NOTE — Progress Notes (Signed)
Patient observed for 30 minutes post iron infusion with no adverse reaction. Vitals stable and patient in no distress upon leaving [facility].

## 2022-05-24 NOTE — Patient Instructions (Signed)

## 2022-05-29 ENCOUNTER — Ambulatory Visit: Payer: PRIVATE HEALTH INSURANCE

## 2022-05-29 ENCOUNTER — Telehealth: Payer: Self-pay | Admitting: Internal Medicine

## 2022-05-29 NOTE — Telephone Encounter (Signed)
Patient wants to transfer of care from Dr. Harl Bowie to Dr. Oval Linsey. Please confirm transfer

## 2022-05-31 ENCOUNTER — Other Ambulatory Visit: Payer: Self-pay | Admitting: Physician Assistant

## 2022-05-31 ENCOUNTER — Encounter: Payer: Self-pay | Admitting: Internal Medicine

## 2022-05-31 ENCOUNTER — Inpatient Hospital Stay: Payer: Medicare Other

## 2022-05-31 ENCOUNTER — Ambulatory Visit: Payer: Medicare Other

## 2022-05-31 ENCOUNTER — Telehealth: Payer: Self-pay | Admitting: Medical Oncology

## 2022-05-31 ENCOUNTER — Other Ambulatory Visit: Payer: Self-pay | Admitting: Medical Oncology

## 2022-05-31 ENCOUNTER — Encounter: Payer: Self-pay | Admitting: Pulmonary Disease

## 2022-05-31 NOTE — Telephone Encounter (Signed)
Appt error- Her appt for today was not cancelled.   Tamara Daugherty presented to the [facility] today for Iron. Originally scheduled for venofer x 3 doses but the iron was changed to  feraheme x 2 and she completed 2nd dose last week.    Pt confirmed her next appt.  I told her to call if symptoms worsen.

## 2022-05-31 NOTE — Telephone Encounter (Signed)
Patient is scheduled to see Dr. Harl Bowie on 12/11.

## 2022-05-31 NOTE — Telephone Encounter (Deleted)
Appt error- Her appt for today was not cancelled.  Tamara Daugherty presented to the [facility] today for Iron. Originally scheduled for venofer x 3 doses but the iron was changed to  feraheme x 2 and she completed 2nd dose last week.    Pt confirmed her next appt.  I told her to call if symptoms worsen.

## 2022-06-03 ENCOUNTER — Ambulatory Visit: Payer: PRIVATE HEALTH INSURANCE | Admitting: Internal Medicine

## 2022-06-04 DIAGNOSIS — M1611 Unilateral primary osteoarthritis, right hip: Secondary | ICD-10-CM | POA: Diagnosis not present

## 2022-06-04 DIAGNOSIS — R0609 Other forms of dyspnea: Secondary | ICD-10-CM | POA: Diagnosis not present

## 2022-06-04 DIAGNOSIS — D508 Other iron deficiency anemias: Secondary | ICD-10-CM | POA: Diagnosis not present

## 2022-06-04 DIAGNOSIS — R1031 Right lower quadrant pain: Secondary | ICD-10-CM | POA: Diagnosis not present

## 2022-06-04 DIAGNOSIS — Z7902 Long term (current) use of antithrombotics/antiplatelets: Secondary | ICD-10-CM | POA: Diagnosis not present

## 2022-06-04 DIAGNOSIS — M25551 Pain in right hip: Secondary | ICD-10-CM | POA: Diagnosis not present

## 2022-06-05 DIAGNOSIS — D509 Iron deficiency anemia, unspecified: Secondary | ICD-10-CM | POA: Diagnosis not present

## 2022-06-05 DIAGNOSIS — K449 Diaphragmatic hernia without obstruction or gangrene: Secondary | ICD-10-CM | POA: Diagnosis not present

## 2022-06-05 DIAGNOSIS — D124 Benign neoplasm of descending colon: Secondary | ICD-10-CM | POA: Diagnosis not present

## 2022-06-12 ENCOUNTER — Ambulatory Visit (INDEPENDENT_AMBULATORY_CARE_PROVIDER_SITE_OTHER): Payer: Medicare Other

## 2022-06-12 VITALS — BP 129/79 | HR 58 | Temp 98.4°F | Resp 18 | Ht 65.0 in | Wt 289.4 lb

## 2022-06-12 DIAGNOSIS — J4550 Severe persistent asthma, uncomplicated: Secondary | ICD-10-CM | POA: Diagnosis not present

## 2022-06-12 MED ORDER — BENRALIZUMAB 30 MG/ML ~~LOC~~ SOSY
30.0000 mg | PREFILLED_SYRINGE | Freq: Once | SUBCUTANEOUS | Status: AC
  Administered 2022-06-12: 30 mg via SUBCUTANEOUS
  Filled 2022-06-12: qty 1

## 2022-06-12 NOTE — Progress Notes (Signed)
Diagnosis: Asthma  Provider:  Marshell Garfinkel MD  Procedure: Injection  Fasenra (Benralizumab), Dose: 30 mg, Site: subcutaneous, Number of injections: 1  Post Care:     Discharge: Condition: Good, Destination: Home . AVS provided to patient.   Performed by:  Arnoldo Morale, RN

## 2022-06-12 NOTE — Telephone Encounter (Signed)
Ok per Dr Oval Linsey

## 2022-07-04 ENCOUNTER — Ambulatory Visit: Payer: PRIVATE HEALTH INSURANCE | Admitting: Internal Medicine

## 2022-07-04 ENCOUNTER — Other Ambulatory Visit: Payer: PRIVATE HEALTH INSURANCE

## 2022-07-15 ENCOUNTER — Inpatient Hospital Stay (HOSPITAL_BASED_OUTPATIENT_CLINIC_OR_DEPARTMENT_OTHER): Payer: Medicare Other | Admitting: Internal Medicine

## 2022-07-15 ENCOUNTER — Inpatient Hospital Stay: Payer: Medicare Other | Attending: Internal Medicine

## 2022-07-15 VITALS — BP 107/61 | HR 69 | Temp 98.7°F | Resp 18 | Wt 293.2 lb

## 2022-07-15 DIAGNOSIS — K912 Postsurgical malabsorption, not elsewhere classified: Secondary | ICD-10-CM | POA: Diagnosis present

## 2022-07-15 DIAGNOSIS — D500 Iron deficiency anemia secondary to blood loss (chronic): Secondary | ICD-10-CM

## 2022-07-15 DIAGNOSIS — D508 Other iron deficiency anemias: Secondary | ICD-10-CM | POA: Insufficient documentation

## 2022-07-15 LAB — CBC WITH DIFFERENTIAL (CANCER CENTER ONLY)
Abs Immature Granulocytes: 0.01 10*3/uL (ref 0.00–0.07)
Basophils Absolute: 0.1 10*3/uL (ref 0.0–0.1)
Basophils Relative: 1 %
Eosinophils Absolute: 0 10*3/uL (ref 0.0–0.5)
Eosinophils Relative: 0 %
HCT: 36.6 % (ref 36.0–46.0)
Hemoglobin: 12.2 g/dL (ref 12.0–15.0)
Immature Granulocytes: 0 %
Lymphocytes Relative: 34 %
Lymphs Abs: 1.9 10*3/uL (ref 0.7–4.0)
MCH: 29.5 pg (ref 26.0–34.0)
MCHC: 33.3 g/dL (ref 30.0–36.0)
MCV: 88.6 fL (ref 80.0–100.0)
Monocytes Absolute: 0.6 10*3/uL (ref 0.1–1.0)
Monocytes Relative: 12 %
Neutro Abs: 2.8 10*3/uL (ref 1.7–7.7)
Neutrophils Relative %: 53 %
Platelet Count: 325 10*3/uL (ref 150–400)
RBC: 4.13 MIL/uL (ref 3.87–5.11)
RDW: 20.2 % — ABNORMAL HIGH (ref 11.5–15.5)
WBC Count: 5.4 10*3/uL (ref 4.0–10.5)
nRBC: 0 % (ref 0.0–0.2)

## 2022-07-15 LAB — IRON AND IRON BINDING CAPACITY (CC-WL,HP ONLY)
Iron: 95 ug/dL (ref 28–170)
Saturation Ratios: 27 % (ref 10.4–31.8)
TIBC: 357 ug/dL (ref 250–450)
UIBC: 262 ug/dL (ref 148–442)

## 2022-07-15 NOTE — Progress Notes (Signed)
[place] [phone]   Fax:([phone]) [phone]  OFFICE PROGRESS NOTE  Eber Hong, [place] [address]  DIAGNOSIS: Persistent iron deficiency anemia with no clear etiology except for possibility of dietary insufficiency and the patient did not have any improvement in her anemia with the oral iron tablet  PRIOR THERAPY: Iron infusion with Venofer 300 mg IV weekly for 3 weeks.  CURRENT THERAPY: Vitron-C 1 tablet p.o. daily.  INTERVAL HISTORY: Tamara Daugherty 72 y.o. female returns to the clinic today for follow-up visit.  The patient is feeling fine today with no concerning complaints.  Her fatigue has significantly improved.  She received iron infusion with Venofer in December 2023.  She also was seen by her gastroenterologist and was found to have colon polyps that were removed.  She denied having any current chest pain, shortness of breath, cough or hemoptysis.  She has no nausea, vomiting, diarrhea or constipation.  She has no headache or visual changes.  She continues to take over-the-counter multivitamins.  She is here for evaluation and repeat blood work.   MEDICAL HISTORY: Past Medical History:  Diagnosis Date   Allergy    Anemia    takes Iron daily   Anxiety    Arthritis    Asthma    Symbicort daily and Albuterol daily as needed   B12 deficiency    Back pain    Bilateral swelling of feet    Bruises easily    d/t being on Plavix    Chest pain    Complication of anesthesia    long time to wake up - referred to pulmonary after carpel tunnel procedure - for asthma and undiagnosed sleep apnea   Constipation    Depression    GERD (gastroesophageal reflux disease)    takes Protonix daily   Headache    Heart murmur    [facility] - [place] [facility]   Heart murmur    History of blood transfusion    no abnormal reaction noted   History of colon polyps    beningn   History of migraine    couple of wks ago was the last one    Hyperlipidemia    takes Atorvastatin daily   Hypertension    takes Benicar and Diltiazem daily   Hypothyroidism    takes Synthroid daily   Itching    takes Atarax daily as needed   Joint pain    Joint pain    Joint swelling    Lactose intolerance    Leg swelling    Liver cyst    Mini stroke    Multiple allergies    takes Claritin daily as needed as well as using Flonase if needed   Muscle spasm    takes Baclofen if needed   Osteoarthritis    Peripheral edema    takes Torsemide daily   Pneumonia    hx of   Prediabetes    Primary localized osteoarthritis of left knee 06/21/2014   Primary localized osteoarthritis of right knee 03/21/2015   Restrictive lung disease    Sleep apnea    cpap   Sleep apnea    Swallowing difficulty    TIA (transient ischemic attack)    takes Plavix daily;notices right side is slightly weaker than left   Vertigo    takes Meclizine daily as needed   Vitamin D deficiency     ALLERGIES:  is allergic to bee venom, diphenhydramine hcl, diphenhydramine  hcl, percocet [oxycodone-acetaminophen], propoxyphene, sulfa antibiotics, doxycycline, oxycodone-acetaminophen, and penicillins.  MEDICATIONS:  Current Outpatient Medications  Medication Sig Dispense Refill   albuterol (VENTOLIN HFA) 108 (90 Base) MCG/ACT inhaler Inhale 2 puffs into the lungs every 6 (six) hours as needed for wheezing or shortness of breath. 18 g 3   atorvastatin (LIPITOR) 10 MG tablet Take 10 mg by mouth daily.     budesonide (PULMICORT) 0.5 MG/2ML nebulizer solution USE 1 VIAL  IN  NEBULIZER TWICE  DAILY - At 10 AM And 5 PM - Rinse Mouth After Treatment 120 mL 11   budesonide-formoterol (SYMBICORT) 160-4.5 MCG/ACT inhaler Inhale 2 puffs into the lungs in the morning and at bedtime. 1 each 12   clopidogrel (PLAVIX) 75 MG tablet Take 1 tablet by mouth daily.     diltiazem (CARDIZEM CD) 240 MG 24 hr capsule Take 240 mg by mouth daily.     EPINEPHrine 0.3 mg/0.3 mL IJ SOAJ injection  Inject 0.3 mg into the muscle once.     fluticasone (FLONASE) 50 MCG/ACT nasal spray Place 1 spray into both nostrils 2 (two) times daily as needed for allergies or rhinitis.     formoterol (PERFOROMIST) 20 MCG/2ML nebulizer solution USE 1 VIAL  IN  NEBULIZER TWICE  DAILY - - Morning And Evening 120 mL 11   guaiFENesin (MUCINEX) 600 MG 12 hr tablet Take 600 mg by mouth 2 (two) times daily as needed for to loosen phlegm.     hydrALAZINE (APRESOLINE) 25 MG tablet TAKE 1 TABLET BY MOUTH EVERY DAY IN THE MORNING AND AT BEDTIME     ipratropium-albuterol (DUONEB) 0.5-2.5 (3) MG/3ML SOLN Inhale into the lungs.     Iron-Vitamin C (VITRON-C) 65-125 MG TABS Take by mouth.     levothyroxine (SYNTHROID, LEVOTHROID) 50 MCG tablet Take 50 mcg by mouth daily before breakfast.     loratadine (CLARITIN) 10 MG tablet Take 10 mg by mouth daily as needed for allergies.     metoprolol succinate (TOPROL-XL) 50 MG 24 hr tablet Take 50 mg by mouth daily. Take with or immediately following a meal.     montelukast (SINGULAIR) 10 MG tablet Take 1 tablet (10 mg total) by mouth at bedtime. 90 tablet 3   Multiple Vitamins-Minerals (MULTIVITAMIN WITH MINERALS) tablet Take 1 tablet by mouth daily.     olmesartan-hydrochlorothiazide (BENICAR HCT) 40-25 MG per tablet Take 1 tablet by mouth daily.     Omega-3 Fatty Acids (FISH OIL PO) Take 1 capsule by mouth 2 (two) times daily.     pantoprazole (PROTONIX) 40 MG tablet Take 40 mg by mouth daily.     potassium chloride (MICRO-K) 10 MEQ CR capsule Take 30 mEq by mouth daily.      pyridOXINE (B-6) 50 MG tablet Take 50 mg by mouth daily.     Spacer/Aero-Holding Chambers (AEROCHAMBER MV) inhaler Use as instructed 1 each 0   torsemide (DEMADEX) 20 MG tablet Take 1 tablet (20 mg total) by mouth as needed. 30 tablet 1   TURMERIC PO Take 1 capsule by mouth daily.     Vitamin D, Ergocalciferol, (DRISDOL) 1.25 MG (50000 UNIT) CAPS capsule Take 1 capsule (50,000 Units total) by mouth every 7  (seven) days. 4 capsule 0   No current facility-administered medications for this visit.    SURGICAL HISTORY:  Past Surgical History:  Procedure Laterality Date   ABDOMINAL HYSTERECTOMY     BACK SURGERY     BREAST SURGERY     cyst removal  CARDIAC CATHETERIZATION     carpel tunnel Right    COLONOSCOPY     ESOPHAGOGASTRODUODENOSCOPY     KNEE ARTHROSCOPY     TONSILLECTOMY     TOTAL KNEE ARTHROPLASTY Left 06/21/2014   Procedure: LEFT TOTAL KNEE ARTHROPLASTY;  Surgeon: Johnny Bridge, MD;  Location: [facility];  Service: Orthopedics;  Laterality: Left;   TOTAL KNEE ARTHROPLASTY Right 03/21/2015   Procedure: TOTAL KNEE ARTHROPLASTY STEROID INECTION BOTH KNEES;  Surgeon: Marchia Bond, MD;  Location: [facility];  Service: Orthopedics;  Laterality: Right;   trigger thumb      REVIEW OF SYSTEMS:  A comprehensive review of systems was negative.   PHYSICAL EXAMINATION: General appearance: alert, cooperative, and no distress Head: Normocephalic, without obvious abnormality, atraumatic Neck: no adenopathy, no JVD, supple, symmetrical, trachea midline, and thyroid not enlarged, symmetric, no tenderness/mass/nodules Lymph nodes: Cervical, supraclavicular, and axillary nodes normal. Resp: clear to auscultation bilaterally Back: symmetric, no curvature. ROM normal. No CVA tenderness. Cardio: regular rate and rhythm, S1, S2 normal, no murmur, click, rub or gallop GI: soft, non-tender; bowel sounds normal; no masses,  no organomegaly Extremities: extremities normal, atraumatic, no cyanosis or edema  ECOG PERFORMANCE STATUS: 1 - Symptomatic but completely ambulatory  Blood pressure 107/61, pulse 69, temperature 98.7 F (37.1 C), temperature source Oral, resp. rate 18, weight 293 lb 4 oz (133 kg), SpO2 97 %.  LABORATORY DATA: Lab Results  Component Value Date   WBC 5.4 07/15/2022   HGB 12.2 07/15/2022   HCT 36.6 07/15/2022   MCV 88.6 07/15/2022   PLT 325 07/15/2022      Chemistry       Component Value Date/Time   NA 133 (L) 05/02/2022 1304   K 4.3 05/02/2022 1304   CL 95 (L) 05/02/2022 1304   CO2 26 05/02/2022 1304   BUN 13 05/02/2022 1304   CREATININE 1.03 (H) 05/02/2022 1304   CREATININE 1.09 (H) 11/01/2020 1115      Component Value Date/Time   CALCIUM 9.3 05/02/2022 1304   ALKPHOS 49 05/02/2022 1304   AST 16 05/02/2022 1304   AST 14 (L) 11/01/2020 1115   ALT 16 05/02/2022 1304   ALT 12 11/01/2020 1115   BILITOT 0.2 05/02/2022 1304   BILITOT 0.3 11/01/2020 1115       RADIOGRAPHIC STUDIES: No results found.  ASSESSMENT AND PLAN: This is a very pleasant 72 years old African-American female with iron deficiency anemia secondary to malabsorption and lack of benefit to the oral iron tablets. The patient was recently found to have colon polyps that were removed. She also received iron infusion with Venofer for 3 doses and she tolerated it fairly well. Repeat CBC today showed significant improvement of her anemia with hemoglobin of 12.1 hematocrit 36.6%.  Iron studies showed normal serum iron of 95 with iron saturation of 27%. Ferritin level still pending. I recommended for the patient to continue on with the oral multivitamins for now. I will see her back for follow-up visit in 6 months for evaluation and repeat CBC, iron study and ferritin. She was advised to call immediately if she has any other concerning symptoms in the interval. The patient voices understanding of current disease status and treatment options and is in agreement with the current care plan.  All questions were answered. The patient knows to call the clinic with any problems, questions or concerns. We can certainly see the patient much sooner if necessary.   Disclaimer: This note was dictated with voice recognition  software. Similar sounding words can inadvertently be transcribed and may not be corrected upon review.

## 2022-07-16 LAB — FERRITIN: Ferritin: 18 ng/mL (ref 11–307)

## 2022-07-25 ENCOUNTER — Other Ambulatory Visit (HOSPITAL_BASED_OUTPATIENT_CLINIC_OR_DEPARTMENT_OTHER): Payer: Self-pay | Admitting: Pulmonary Disease

## 2022-07-29 ENCOUNTER — Encounter: Payer: Self-pay | Admitting: Internal Medicine

## 2022-07-29 ENCOUNTER — Encounter: Payer: Self-pay | Admitting: Pulmonary Disease

## 2022-08-05 ENCOUNTER — Encounter (HOSPITAL_BASED_OUTPATIENT_CLINIC_OR_DEPARTMENT_OTHER): Payer: Self-pay | Admitting: Pulmonary Disease

## 2022-08-06 ENCOUNTER — Telehealth: Payer: Self-pay | Admitting: Pharmacy Technician

## 2022-08-06 NOTE — Telephone Encounter (Signed)
Mychart message sent by pt:  Tamara Daugherty  P [facility] Clinical Pool (supporting Chesley Mires, MD)Yesterday (12:50 PM)    I am scheduled to have my Fasenra injection on Wednesday, February 14.  Do you think it is okay to take the injection since I tested positive for Covid on January 25th?  I am not displaying any symptoms at this time.      Dr. Halford Chessman, please advise.

## 2022-08-06 NOTE — Telephone Encounter (Signed)
Auth Submission: NO AUTH NEEDED Payer: medicare a/b and supp Medication & CPT/J Code(s) submitted: Fasenra (Benralizumab) [phone] Route of submission (phone, fax, portal):  Phone # Fax # Auth type: Buy/Bill Units/visits requested: [id number] Reference number:  Supplement has been verified and will follow medicare quidelines.   Approval from: 08/06/22 to 08/07/23

## 2022-08-06 NOTE — Telephone Encounter (Signed)
Okay to get fasenra injection since she is not having any symptoms from [place].

## 2022-08-07 ENCOUNTER — Ambulatory Visit: Payer: Medicare Other

## 2022-08-07 VITALS — BP 147/85 | HR 59 | Temp 97.7°F | Resp 20 | Ht 65.0 in | Wt 296.2 lb

## 2022-08-07 DIAGNOSIS — J4550 Severe persistent asthma, uncomplicated: Secondary | ICD-10-CM | POA: Diagnosis not present

## 2022-08-07 MED ORDER — BENRALIZUMAB 30 MG/ML ~~LOC~~ SOSY
30.0000 mg | PREFILLED_SYRINGE | Freq: Once | SUBCUTANEOUS | Status: AC
  Administered 2022-08-07: 30 mg via SUBCUTANEOUS

## 2022-08-07 NOTE — Progress Notes (Signed)
Diagnosis: Asthma  Provider:  Marshell Garfinkel MD  Procedure: Injection  Fasenra (Benralizumab), Dose: 30 mg, Site: subcutaneous, Number of injections: 1  Post Care:  N/A  Discharge: Condition: Good, Destination: Home . AVS Declined  Performed by:  Adelina Mings, LPN

## 2022-08-09 ENCOUNTER — Other Ambulatory Visit: Payer: Self-pay | Admitting: Pulmonary Disease

## 2022-09-16 ENCOUNTER — Ambulatory Visit (INDEPENDENT_AMBULATORY_CARE_PROVIDER_SITE_OTHER): Payer: Medicare Other | Admitting: Pulmonary Disease

## 2022-09-16 ENCOUNTER — Encounter (HOSPITAL_BASED_OUTPATIENT_CLINIC_OR_DEPARTMENT_OTHER): Payer: Self-pay | Admitting: Pulmonary Disease

## 2022-09-16 VITALS — BP 112/70 | HR 60 | Temp 98.1°F | Ht 65.0 in | Wt 296.0 lb

## 2022-09-16 DIAGNOSIS — I10 Essential (primary) hypertension: Secondary | ICD-10-CM | POA: Diagnosis not present

## 2022-09-16 DIAGNOSIS — J8283 Eosinophilic asthma: Secondary | ICD-10-CM | POA: Diagnosis not present

## 2022-09-16 DIAGNOSIS — G4733 Obstructive sleep apnea (adult) (pediatric): Secondary | ICD-10-CM | POA: Diagnosis not present

## 2022-09-16 MED ORDER — METHYLPREDNISOLONE ACETATE 80 MG/ML IJ SUSP
80.0000 mg | Freq: Once | INTRAMUSCULAR | Status: AC
  Administered 2022-09-16: 80 mg via INTRAMUSCULAR

## 2022-09-16 MED ORDER — AZITHROMYCIN 250 MG PO TABS: ORAL_TABLET | ORAL | 0 refills | Status: AC

## 2022-09-16 NOTE — Patient Instructions (Signed)
Zithromax 250 mg pill >> 2 pills on day 1, then 1 pill daily for the next 4 days  Depo-medrol injection today  Follow up in 5 to 6 months

## 2022-09-16 NOTE — Addendum Note (Signed)
Addended by: Rosana Berger on: 09/16/2022 12:29 PM   Modules accepted: Orders

## 2022-09-16 NOTE — Progress Notes (Signed)
[facility]  Chief Complaint  Patient presents with   Follow-up    Increased SOB since Oct 2023. She has some wheezing and prod cough with clear sputum. She had Covid 19 in Jan 2024. She is using her albuterol inhaler at least once per day.     Constitutional:  BP 112/70 (BP Location: Left Arm, Cuff Size: Large)   Pulse 60   Temp 98.1 F (36.7 C) (Oral)   Ht 5\' 5"  (1.651 m)   Wt 296 lb (134.3 kg)   SpO2 95% Comment: on RA  BMI 49.26 kg/m   Past Medical History:  Anemia, Anxiety, OA, GERD, HA, Colon polyps, HLD, HTN, Hypothyroidism, PNA, Vertigo, TIA  Past Surgical History:  She  has a past surgical history that includes Tonsillectomy; Abdominal hysterectomy; Back surgery; Knee arthroscopy; Breast surgery; Cardiac catheterization; trigger thumb; carpel tunnel (Right); Total knee arthroplasty (Left, 06/21/2014); Colonoscopy; Esophagogastroduodenoscopy; and Total knee arthroplasty (Right, 03/21/2015).  Brief Summary:  Tamara Daugherty is a 72 y.o. female with eosinophilic asthma and obstructive sleep apnea.      Subjective:   She is with her husband.  She had COVID in January.  She still has cough and wheeze.  Brings up clear sputum.  Sinuses okay.  Not having sore throat.  Denies fever, or hemoptysis.  She had good weight loss when using ozempic, but then insurance stopped paying for this.  Uses CPAP nightly without difficulty.  Physical Exam:   Appearance - well kempt   ENMT - no sinus tenderness, no oral exudate, no LAN, Mallampati 3 airway, no stridor  Respiratory - equal breath sounds bilaterally, no wheezing or rales  CV - s1s2 regular rate and rhythm, no murmurs  Ext - no clubbing, no edema  Skin - no rashes  Psych - normal mood and affect      Pulmonary testing:  RAST 08/30/16 >> dust mights, IgE 121 PFT 09/27/16 >> FEV1 1.56 (71%), FEV1% 81, TLC 3.46 (64%), DLCO 69% PFT 01/09/17 >> FEV1 1.67 (76%), FEV1% 75,  DLCO 65% PFT 04/23/18 >> FEV1 1.61 (76%), FEV1% 77, TLC 4.63 (87%), DLCO 69%  Chest Imaging:  HRCT chest 01/16/17 >> mild tracheomalacia, mild CM, PA 4.1 cm  Sleep Tests:  PSG 12/16/13 ([facility]) >> moderate OSA CPAP 04/01/22 to 04/30/22 >> used on 30 of 30 nights with average 7 hrs 58 min.  Average AHI 4 with CPAP 7 cm H2O  Cardiac Tests:  Echo 12/26/21 >> EF 55%, grade 1 DD, mod LA dilation, mod LA dilation, mod MR  Social History:  She  reports that she has never smoked. She has been exposed to tobacco smoke. She has never used smokeless tobacco. She reports current alcohol use. She reports that she does not use drugs.  Family History:  Her family history includes Cancer in her cousin, father, and maternal aunt; Heart disease in her mother and paternal aunt; Hyperlipidemia in her mother; Hypertension in her mother; Lung cancer in her father and paternal uncle; Rheum arthritis in her mother; Scleroderma in her mother; Stroke in her brother.     Assessment/Plan:   Eosinophilic asthma. - started on fasenra in May 2020, and this has helped control her asthma and reduce exacerbations; she is to continue fasenra - continue perforomist, pulmicort, and singulair - prn albuterol - has lingering cough after COVID infection; will give her depo-medrol 80 mg injection today and Zpak - if cough persists, then add yupelri   Allergic  rhinitis. - continue fasenra, claritin, singulair - azelastine had a bad taste and made her feels nauseous   Obstructive sleep apnea. - she is compliant with CPAP and reports benefit - she uses [employer] as her DME - current CPAP ordered November 2020 - continue CPAP 7 cm H2O  Paroxysmal atrial fibrillation, Hypertension, Mitral regurgitation - she would like to be set up with a new cardiologist; referral placed  Iron deficiency anemia. - followed by Dr. Julien Nordmann [facility]   Time Spent Involved in Patient Care on Day of Examination:  37  minutes  Follow up:   Patient Instructions  Zithromax 250 mg pill >> 2 pills on day 1, then 1 pill daily for the next 4 days  Depo-medrol injection today  Follow up in 5 to 6 months  Medication List:   Allergies as of 09/16/2022       Reactions   Bee Venom Anaphylaxis   Diphenhydramine Hcl Other (See Comments)   Paradoxical reaction/ becomes hyper for days   Diphenhydramine Hcl    Other reaction(s): Other - See Comments Paradoxical reaction/ becomes hyper for days   Percocet [oxycodone-acetaminophen] Other (See Comments)   hallucination   Propoxyphene    Sulfa Antibiotics Itching   Doxycycline    Feels bad   Oxycodone-acetaminophen    Penicillins Hives   No reaction to Ancef.  OK to give.          Medication List        Accurate as of September 16, 2022 12:15 PM. If you have any questions, ask your nurse or doctor.          STOP taking these medications    Symbicort 160-4.5 MCG/ACT inhaler Generic drug: budesonide-formoterol Stopped by: Chesley Mires, MD       TAKE these medications    AeroChamber MV inhaler Use as instructed   albuterol 108 (90 Base) MCG/ACT inhaler Commonly known as: VENTOLIN HFA TAKE 2 PUFFS BY MOUTH EVERY 6 HOURS AS NEEDED FOR WHEEZE OR SHORTNESS OF BREATH   atorvastatin 10 MG tablet Commonly known as: LIPITOR Take 10 mg by mouth daily.   azithromycin 250 MG tablet Commonly known as: ZITHROMAX Take 2 tablets (500 mg total) by mouth daily for 1 day, THEN 1 tablet (250 mg total) daily for 4 days. Start taking on: September 16, 2022 Started by: Chesley Mires, MD   budesonide 0.5 MG/2ML nebulizer solution Commonly known as: PULMICORT USE 1 VIAL  IN  NEBULIZER TWICE  DAILY - At 10 AM And 5 PM - Rinse Mouth After Treatment   clopidogrel 75 MG tablet Commonly known as: PLAVIX Take 1 tablet by mouth daily.   diltiazem 240 MG 24 hr capsule Commonly known as: CARDIZEM CD Take 240 mg by mouth daily.   EPINEPHrine 0.3 mg/0.3 mL Soaj  injection Commonly known as: EPI-PEN Inject 0.3 mg into the muscle once.   FISH OIL PO Take 1 capsule by mouth 2 (two) times daily.   fluticasone 50 MCG/ACT nasal spray Commonly known as: FLONASE Place 1 spray into both nostrils 2 (two) times daily as needed for allergies or rhinitis.   guaiFENesin 600 MG 12 hr tablet Commonly known as: MUCINEX Take 600 mg by mouth 2 (two) times daily as needed for to loosen phlegm.   hydrALAZINE 25 MG tablet Commonly known as: APRESOLINE TAKE 1 TABLET BY MOUTH EVERY DAY IN THE MORNING AND AT BEDTIME   ipratropium-albuterol 0.5-2.5 (3) MG/3ML Soln Commonly known as: DUONEB Inhale into  the lungs.   levothyroxine 50 MCG tablet Commonly known as: SYNTHROID Take 50 mcg by mouth daily before breakfast.   loratadine 10 MG tablet Commonly known as: CLARITIN Take 10 mg by mouth daily as needed for allergies.   metoprolol succinate 50 MG 24 hr tablet Commonly known as: TOPROL-XL Take 50 mg by mouth daily. Take with or immediately following a meal.   montelukast 10 MG tablet Commonly known as: SINGULAIR Take 1 tablet (10 mg total) by mouth at bedtime.   multivitamin with minerals tablet Take 1 tablet by mouth daily.   olmesartan-hydrochlorothiazide 40-25 MG tablet Commonly known as: BENICAR HCT Take 1 tablet by mouth daily.   pantoprazole 40 MG tablet Commonly known as: PROTONIX Take 40 mg by mouth daily.   Perforomist 20 MCG/2ML nebulizer solution Generic drug: formoterol USE 1 VIAL  IN  NEBULIZER TWICE  DAILY - - Morning And Evening   potassium chloride 10 MEQ CR capsule Commonly known as: MICRO-K Take 30 mEq by mouth daily.   pyridOXINE 50 MG tablet Commonly known as: B-6 Take 50 mg by mouth daily.   torsemide 20 MG tablet Commonly known as: DEMADEX Take 1 tablet (20 mg total) by mouth as needed.   TURMERIC PO Take 1 capsule by mouth daily.   Vitamin D (Ergocalciferol) 1.25 MG (50000 UNIT) Caps capsule Commonly known as:  DRISDOL Take 1 capsule (50,000 Units total) by mouth every 7 (seven) days.   Vitron-C 65-125 MG Tabs Generic drug: Iron-Vitamin C Take by mouth.        Signature:  Chesley Mires, MD [facility] Pager - [phone] 09/16/2022, 12:15 PM

## 2022-10-02 ENCOUNTER — Ambulatory Visit: Payer: Medicare Other

## 2022-10-02 VITALS — BP 140/73 | HR 54 | Temp 98.3°F | Resp 20 | Ht 65.0 in | Wt 292.8 lb

## 2022-10-02 DIAGNOSIS — J4550 Severe persistent asthma, uncomplicated: Secondary | ICD-10-CM

## 2022-10-02 MED ORDER — BENRALIZUMAB 30 MG/ML ~~LOC~~ SOSY
30.0000 mg | PREFILLED_SYRINGE | Freq: Once | SUBCUTANEOUS | Status: AC
  Administered 2022-10-02: 30 mg via SUBCUTANEOUS
  Filled 2022-10-02: qty 1

## 2022-10-02 NOTE — Progress Notes (Signed)
Diagnosis: Asthma  Provider:  Praveen Mannam MD  Procedure: Injection  Fasenra (Benralizumab), Dose: 30 mg, Site: subcutaneous, Number of injections: 1  Post Care:  NA  Discharge: Condition: Good, Destination: Home . AVS Declined  Performed by:  Ionia Schey, RN       

## 2022-10-04 ENCOUNTER — Ambulatory Visit (INDEPENDENT_AMBULATORY_CARE_PROVIDER_SITE_OTHER): Payer: Medicare Other | Admitting: Family

## 2022-10-04 ENCOUNTER — Encounter (HOSPITAL_BASED_OUTPATIENT_CLINIC_OR_DEPARTMENT_OTHER): Payer: Self-pay | Admitting: Family

## 2022-10-04 VITALS — BP 142/78 | HR 72 | Ht 65.0 in | Wt 293.3 lb

## 2022-10-04 DIAGNOSIS — D509 Iron deficiency anemia, unspecified: Secondary | ICD-10-CM

## 2022-10-04 DIAGNOSIS — G4733 Obstructive sleep apnea (adult) (pediatric): Secondary | ICD-10-CM

## 2022-10-04 DIAGNOSIS — R0609 Other forms of dyspnea: Secondary | ICD-10-CM

## 2022-10-04 DIAGNOSIS — I10 Essential (primary) hypertension: Secondary | ICD-10-CM | POA: Diagnosis not present

## 2022-10-04 DIAGNOSIS — I340 Nonrheumatic mitral (valve) insufficiency: Secondary | ICD-10-CM

## 2022-10-04 DIAGNOSIS — Z6841 Body Mass Index (BMI) 40.0 and over, adult: Secondary | ICD-10-CM

## 2022-10-04 MED ORDER — TORSEMIDE 20 MG PO TABS: 20.0000 mg | ORAL_TABLET | Freq: Every day | ORAL | 3 refills | Status: DC

## 2022-10-04 NOTE — Progress Notes (Signed)
Office Visit    Patient Name: Tamara Daugherty Date of Encounter: 10/04/2022  PCP:  Kathlee Nations, MD   [facility]  Cardiologist:  Chilton Si, MD  Advanced Practice Provider:  No care team member to display Electrophysiologist:  None      Chief Complaint    Tamara Daugherty is a 72 y.o. female presents today for dyspnea   Past Medical History    Past Medical History:  Diagnosis Date   Allergy    Anemia    takes Iron daily   Anxiety    Arthritis    Asthma    Symbicort daily and Albuterol daily as needed   B12 deficiency    Back pain    Bilateral swelling of feet    Bruises easily    d/t being on Plavix    Chest pain    Complication of anesthesia    long time to wake up - referred to pulmonary after carpel tunnel procedure - for asthma and undiagnosed sleep apnea   Constipation    Depression    GERD (gastroesophageal reflux disease)    takes Protonix daily   Headache    Heart murmur    [facility] [place] [facility]   Heart murmur    History of blood transfusion    no abnormal reaction noted   History of colon polyps    beningn   History of migraine    couple of wks ago was the last one   Hyperlipidemia    takes Atorvastatin daily   Hypertension    takes Benicar and Diltiazem daily   Hypothyroidism    takes Synthroid daily   Itching    takes Atarax daily as needed   Joint pain    Joint pain    Joint swelling    Lactose intolerance    Leg swelling    Liver cyst    Mini stroke    Multiple allergies    takes Claritin daily as needed as well as using Flonase if needed   Muscle spasm    takes Baclofen if needed   Osteoarthritis    Peripheral edema    takes Torsemide daily   Pneumonia    hx of   Prediabetes    Primary localized osteoarthritis of left knee 06/21/2014   Primary localized osteoarthritis of right knee 03/21/2015   Restrictive lung disease    Sleep apnea    cpap   Sleep apnea    Swallowing difficulty    TIA  (transient ischemic attack)    takes Plavix daily;notices right side is slightly weaker than left   Vertigo    takes Meclizine daily as needed   Vitamin D deficiency    Past Surgical History:  Procedure Laterality Date   ABDOMINAL HYSTERECTOMY     BACK SURGERY     BREAST SURGERY     cyst removal   CARDIAC CATHETERIZATION     carpel tunnel Right    COLONOSCOPY     ESOPHAGOGASTRODUODENOSCOPY     KNEE ARTHROSCOPY     TONSILLECTOMY     TOTAL KNEE ARTHROPLASTY Left 06/21/2014   Procedure: LEFT TOTAL KNEE ARTHROPLASTY;  Surgeon: Eulas Post, MD;  Location: [facility] OR;  Service: Orthopedics;  Laterality: Left;   TOTAL KNEE ARTHROPLASTY Right 03/21/2015   Procedure: TOTAL KNEE ARTHROPLASTY STEROID INECTION BOTH KNEES;  Surgeon: Teryl Lucy, MD;  Location: [facility] OR;  Service: Orthopedics;  Laterality: Right;   trigger thumb  Allergies  Allergies  Allergen Reactions   Bee Venom Anaphylaxis   Diphenhydramine Hcl Other (See Comments)    Paradoxical reaction/ becomes hyper for days   Diphenhydramine Hcl     Other reaction(s): Other - See Comments Paradoxical reaction/ becomes hyper for days   Percocet [Oxycodone-Acetaminophen] Other (See Comments)    hallucination   Propoxyphene    Sulfa Antibiotics Itching   Doxycycline     Feels bad   Oxycodone-Acetaminophen    Penicillins Hives    No reaction to Ancef.  OK to give.      History of Present Illness    Tamara Daugherty is a 72 y.o. female with a hx of asthma, GERD, TIA, hypothyroidism, hypertension, sleep apnea on CPAP, obesity, IDA, mitral regurgitation last seen 11/50/23 by Dr. Carolan Clines.  Previously evaluated Dr. Virgilio Belling 1 in 2018 with echocardiogram normal LVEF, mild MR.  Cardiac catheterization greater than 20 years ago unremarkable.  Last seen 05/08/2022 noting dyspnea in the setting of anemia and BRBPR. Was transferred to ER but left without being seen.  She followed with hematology and was treated with IV iron.  Saw Dr.  Iran Planas of pulmonology 09/16/2022 with lingering cough after COVID treating with Depo-Medrol and Z-Pak.  She was recommended to schedule overdue follow-up with cardiology.  Presents today for follow up independently. Weight down 7 pounds since last clinic visit.  However notes previously down to as low as 284 pounds and has started to increase to the first part of the year.  Notes dyspnea on exertion with distances such as from parking lot into the office. This has been ongoing since January 2024. Notes some days are better than others. No orthopnea, PND. Wearing her CPAP regularly. Sometimes associated with palpitations. Does note clear mucus in morning and night and wheezing which has been ongoing. Does note difficulties with allergies. Has not been exercising as she cannot walk long distance without dyspnea.  Checking blood pressure only intermittently at home and no specific readings reported.  Labs 09/06/22 Hb 12.8, Plt 299   EKGs/Labs/Other Studies Reviewed:   The following studies were reviewed today: Cardiac Studies & Procedures       ECHOCARDIOGRAM  ECHOCARDIOGRAM COMPLETE 12/26/2021  Narrative ECHOCARDIOGRAM LIMITED REPORT    Patient Name:   Tamara Daugherty Date of Exam: 12/26/2021 Medical Rec #:  [id number]     Height:       65.0 in Accession #:    [id number]    Weight:       290.2 lb Date of Birth:  04-Dec-1950      BSA:          2.317 m Patient Age:    71 years      BP:           124/70 mmHg Patient Gender: F             HR:           60 bpm. Exam Location:  [address]  Procedure: 2D Echo, 3D Echo, Cardiac Doppler and Color Doppler  Indications:    I34.0 Mitral Valve Insufficiency  History:        Patient has prior history of Echocardiogram examinations, most recent 01/16/2017. TIA, Signs/Symptoms:Chest Pain, Shortness of Breath, Murmur and Edema; Risk Factors:Hypertension, Dyslipidemia, Family History of Coronary Artery Disease and Sleep Apnea. Restrictive Lung Disease,  Asthma, Obesity.  Sonographer:    Farrel Conners RDCS Referring Phys: Maisie Fus  IMPRESSIONS   1. Left  ventricular ejection fraction, by estimation, is 55%. The left ventricle has no regional wall motion abnormalities. Left ventricular diastolic parameters are consistent with Grade I diastolic dysfunction (impaired relaxation). 2. Right ventricular systolic function is normal. The right ventricular size is normal. There is normal pulmonary artery systolic pressure. The estimated right ventricular systolic pressure is 35.0 mmHg. 3. Left atrial size was moderately dilated. 4. Right atrial size was mildly dilated. 5. The mitral valve is abnormal. Moderate mitral valve regurgitation (not well-visualized). No evidence of mitral stenosis. 6. The aortic valve is tricuspid. There is mild calcification of the aortic valve. Aortic valve regurgitation is not visualized. No aortic stenosis is present. 7. The inferior vena cava is dilated in size with >50% respiratory variability, suggesting right atrial pressure of 8 mmHg.  FINDINGS Left Ventricle: Left ventricular ejection fraction, by estimation, is 55%. The left ventricle has no regional wall motion abnormalities. The left ventricular internal cavity size was normal in size. There is no left ventricular hypertrophy. Left ventricular diastolic parameters are consistent with Grade I diastolic dysfunction (impaired relaxation).  Right Ventricle: The right ventricular size is normal. No increase in right ventricular wall thickness. Right ventricular systolic function is normal. There is normal pulmonary artery systolic pressure. The tricuspid regurgitant velocity is 2.60 m/s, and with an assumed right atrial pressure of 8 mmHg, the estimated right ventricular systolic pressure is 35.0 mmHg.  Left Atrium: Left atrial size was moderately dilated.  Right Atrium: Right atrial size was mildly dilated.  Mitral Valve: The mitral valve is abnormal.  Moderate mitral valve regurgitation. No evidence of mitral valve stenosis.  Tricuspid Valve: The tricuspid valve is normal in structure. Tricuspid valve regurgitation is trivial.  Aortic Valve: The aortic valve is tricuspid. There is mild calcification of the aortic valve. Aortic valve regurgitation is not visualized. No aortic stenosis is present. Aortic valve mean gradient measures 6.0 mmHg. Aortic valve peak gradient measures 11.2 mmHg. Aortic valve area, by VTI measures 4.13 cm.  Pulmonic Valve: The pulmonic valve was normal in structure. Pulmonic valve regurgitation is trivial.  Aorta: The aortic root is normal in size and structure.  Venous: The inferior vena cava is dilated in size with greater than 50% respiratory variability, suggesting right atrial pressure of 8 mmHg.  IAS/Shunts: No atrial level shunt detected by color flow Doppler.  LEFT VENTRICLE PLAX 2D LVIDd:         5.20 cm   Diastology LVIDs:         3.10 cm   LV e' medial:    7.83 cm/s LV PW:         0.90 cm   LV E/e' medial:  11.9 LV IVS:        1.20 cm   LV e' lateral:   11.10 cm/s LVOT diam:     2.80 cm   LV E/e' lateral: 8.4 LV SV:         167 LV SV Index:   72 LVOT Area:     6.16 cm  3D Volume EF: 3D EF:        66 % LV EDV:       214 ml LV ESV:       72 ml LV SV:        142 ml  RIGHT VENTRICLE RV Basal diam:  4.40 cm RV Mid diam:    2.80 cm RV S prime:     16.00 cm/s TAPSE (M-mode): 3.4 cm  LEFT ATRIUM  Index        RIGHT ATRIUM           Index LA diam:      3.40 cm  1.47 cm/m   RA Area:     20.30 cm LA Vol (A2C): 79.1 ml  34.14 ml/m  RA Volume:   64.40 ml  27.79 ml/m LA Vol (A4C): 117.0 ml 50.49 ml/m AORTIC VALVE AV Area (Vmax):    3.99 cm AV Area (Vmean):   4.08 cm AV Area (VTI):     4.13 cm AV Vmax:           167.50 cm/s AV Vmean:          111.000 cm/s AV VTI:            0.406 m AV Peak Grad:      11.2 mmHg AV Mean Grad:      6.0 mmHg LVOT Vmax:         108.50 cm/s LVOT  Vmean:        73.600 cm/s LVOT VTI:          0.272 m LVOT/AV VTI ratio: 0.67  AORTA Ao Root diam: 3.40 cm Ao Asc diam:  2.85 cm  MITRAL VALVE                TRICUSPID VALVE MV Area (PHT): cm          TR Peak grad:   27.0 mmHg MV Decel Time: 229 msec     TR Vmax:        260.00 cm/s MR Peak grad: 109.8 mmHg MR Mean grad: 73.0 mmHg     SHUNTS MR Vmax:      524.00 cm/s   Systemic VTI:  0.27 m MR Vmean:     399.0 cm/s    Systemic Diam: 2.80 cm MV E velocity: 93.33 cm/s MV A velocity: 113.33 cm/s MV E/A ratio:  0.82  Dalton McleanMD Electronically signed by Wilfred Lacy Signature Date/Time: 12/26/2021/9:49:23 PM    Final              EKG:  EKG is not ordered today.    Recent Labs: 05/02/2022: ALT 16; BNP 112.1; BUN 13; Creatinine, Ser 1.03; Potassium 4.3; Sodium 133 07/15/2022: Hemoglobin 12.2; Platelet Count 325  Recent Lipid Panel    Component Value Date/Time   CHOL 154 05/15/2020 1129   TRIG 49 05/15/2020 1129   HDL 66 05/15/2020 1129   LDLCALC 77 05/15/2020 1129    Home Medications   Current Meds  Medication Sig   albuterol (VENTOLIN HFA) 108 (90 Base) MCG/ACT inhaler TAKE 2 PUFFS BY MOUTH EVERY 6 HOURS AS NEEDED FOR WHEEZE OR SHORTNESS OF BREATH   atorvastatin (LIPITOR) 10 MG tablet Take 10 mg by mouth daily.   budesonide (PULMICORT) 0.5 MG/2ML nebulizer solution USE 1 VIAL  IN  NEBULIZER TWICE  DAILY - At 10 AM And 5 PM - Rinse Mouth After Treatment   clopidogrel (PLAVIX) 75 MG tablet Take 1 tablet by mouth daily.   diltiazem (CARDIZEM CD) 240 MG 24 hr capsule Take 240 mg by mouth daily.   EPINEPHrine 0.3 mg/0.3 mL IJ SOAJ injection Inject 0.3 mg into the muscle once.   fluticasone (FLONASE) 50 MCG/ACT nasal spray Place 1 spray into both nostrils 2 (two) times daily as needed for allergies or rhinitis.   formoterol (PERFOROMIST) 20 MCG/2ML nebulizer solution USE 1 VIAL  IN  NEBULIZER TWICE  DAILY - - Morning And Evening  guaiFENesin (MUCINEX) 600 MG 12 hr  tablet Take 600 mg by mouth 2 (two) times daily as needed for to loosen phlegm.   hydrALAZINE (APRESOLINE) 25 MG tablet TAKE 1 TABLET BY MOUTH EVERY DAY IN THE MORNING AND AT BEDTIME   ipratropium-albuterol (DUONEB) 0.5-2.5 (3) MG/3ML SOLN Inhale into the lungs.   Iron-Vitamin C (VITRON-C) 65-125 MG TABS Take by mouth.   levothyroxine (SYNTHROID, LEVOTHROID) 50 MCG tablet Take 50 mcg by mouth daily before breakfast.   loratadine (CLARITIN) 10 MG tablet Take 10 mg by mouth daily as needed for allergies.   metoprolol succinate (TOPROL-XL) 25 MG 24 hr tablet Take 25 mg by mouth in the morning and at bedtime.   montelukast (SINGULAIR) 10 MG tablet Take 1 tablet (10 mg total) by mouth at bedtime.   Multiple Vitamins-Minerals (MULTIVITAMIN WITH MINERALS) tablet Take 1 tablet by mouth daily.   olmesartan-hydrochlorothiazide (BENICAR HCT) 40-25 MG per tablet Take 1 tablet by mouth daily.   Omega-3 Fatty Acids (FISH OIL PO) Take 1 capsule by mouth 2 (two) times daily.   pantoprazole (PROTONIX) 40 MG tablet Take 40 mg by mouth daily.   potassium chloride (MICRO-K) 10 MEQ CR capsule Take 30 mEq by mouth daily.    pyridOXINE (B-6) 50 MG tablet Take 50 mg by mouth daily.   Spacer/Aero-Holding Chambers (AEROCHAMBER MV) inhaler Use as instructed   TURMERIC PO Take 1 capsule by mouth daily.   Vitamin D, Ergocalciferol, (DRISDOL) 1.25 MG (50000 UNIT) CAPS capsule Take 1 capsule (50,000 Units total) by mouth every 7 (seven) days.   [DISCONTINUED] torsemide (DEMADEX) 20 MG tablet Take 1 tablet (20 mg total) by mouth as needed.     Review of Systems      All other systems reviewed and are otherwise negative except as noted above.  Physical Exam    VS:  BP (!) 142/78   Pulse 72   Ht 5\' 5"  (1.651 m)   Wt 293 lb 4.8 oz (133 kg)   BMI 48.81 kg/m  , BMI Body mass index is 48.81 kg/m.  Wt Readings from Last 3 Encounters:  10/04/22 293 lb 4.8 oz (133 kg)  10/02/22 292 lb 12.8 oz (132.8 kg)  09/16/22 296 lb  (134.3 kg)     GEN: Well nourished, overweight, well developed, in no acute distress. HEENT: normal. Neck: Supple, no JVD, carotid bruits, or masses. Cardiac: RRR, no murmurs, rubs, or gallops. No clubbing, cyanosis, edema.  Radials/PT 2+ and equal bilaterally.  Respiratory:  Respirations regular and unlabored, clear to auscultation bilaterally. GI: Soft, nontender, nondistended. MS: No deformity or atrophy. Skin: Warm and dry, no rash. Neuro:  Strength and sensation are intact. Psych: Normal affect.  Assessment & Plan    Asthma - follows with Dr. Craige Cotta of pulmonology.  OSA - CPAP compliance encouraged.  Endorses using routinely.  Exertional dyspnea/diastolic dysfunction- 3 month history of exertional dyspnea. Likely factorial deconditioning, obesity, diastolic dysfunction, asthma.  Plan to transition her torsemide from as needed to 20 mg daily.  BMP/BNP in 1 week.  Will check in via phone call in 1 week.  If symptoms improved plan to continue torsemide.  If symptoms not improved could consider sooner echocardiogram to reassess mitral valve.  Encouraged to gradually increase physical activity.  HTN-BP mildly elevated in clinic today. Discussed to monitor BP at home at least 2 hours after medications and sitting for 5-10 minutes. Change Torsemide to QD, as above.  Could consider increasing dose of hydralazine in the  future if needed.  MR-echo 12/26/2021 normal LVEF 55%, grade 1 diastolic dysfunction, normal PASP, moderate MR.  Consider repeat echo 12/2022.  Recommend optimal BP and volume control, as above.  Obesity - Weight loss via diet and exercise encouraged. Discussed the impact being overweight would have on cardiovascular risk. Ozempic no longer cost effective with insurance per previous notes. Continue to follow with PCP.   IDA-follows with Dr. Arbutus Ped of hematology.  HYPERTENSION CONTROL Vitals:   10/04/22 0825 10/04/22 0905  BP: 138/76 (!) 142/78    The patient's blood  pressure is elevated above target today.  In order to address the patient's elevated BP: Blood pressure will be monitored at home to determine if medication changes need to be made.; A current anti-hypertensive medication was adjusted today.; Follow up with general cardiology has been recommended.         Disposition: Follow up  in 3 months with Gillian Shields, NP and in 4-6 months  with Chilton Si, MD or APP.  Signed, Alver Sorrow, NP 10/04/2022, 12:44 PM [facility]

## 2022-10-04 NOTE — Patient Instructions (Addendum)
Medication Instructions:  Your physician has recommended you make the following change in your medication:  Change: Torsemide 20mg  daily    Lab Work: Your physician recommends that you return for lab work in one week for BMP and BNP- we will reach out to PCP to see if they can do them for you closer to home   Follow-Up: At [facility], you and your health needs are our priority.  As part of our continuing mission to provide you with exceptional heart care, we have created designated Provider Care Teams.  These Care Teams include your primary Cardiologist (physician) and Advanced Practice Providers (APPs -  Physician Assistants and Nurse Practitioners) who all work together to provide you with the care you need, when you need it.  We recommend signing up for the patient portal called "MyChart".  Sign up information is provided on this After Visit Summary.  MyChart is used to connect with patients for Virtual Visits (Telemedicine).  Patients are able to view lab/test results, encounter notes, upcoming appointments, etc.  Non-urgent messages can be sent to your provider as well.   To learn more about what you can do with MyChart, go to [email or web address].    Your next appointment:   3 months with Gillian Shields   &   8/5 at 2:40pm to Establish with Dr. Duke Salvia

## 2022-10-07 ENCOUNTER — Encounter (HOSPITAL_BASED_OUTPATIENT_CLINIC_OR_DEPARTMENT_OTHER): Payer: Self-pay

## 2022-10-10 NOTE — Telephone Encounter (Signed)
Labs from PCP:  09/06/22 A1c 5.5 TSH 1.351, free T4 1.1 Creatinine 0.96, K 4.4, AST 22, ALT 16 Hb 12.8, Hct 38.9, Plt 299, wbc 6.6 Iron 61, UIBC 290, TIBC 351, Trans sat 17 Total cholesterol 147, HDL 63, LDL 77, triglycerides 34  Normal kidney function and electrolytes. Good result! Continue current medications.   Sent to patient via MyChart.  Alver Sorrow, NP

## 2022-10-11 ENCOUNTER — Encounter (HOSPITAL_BASED_OUTPATIENT_CLINIC_OR_DEPARTMENT_OTHER): Payer: Self-pay

## 2022-10-14 ENCOUNTER — Telehealth (HOSPITAL_BASED_OUTPATIENT_CLINIC_OR_DEPARTMENT_OTHER): Payer: Self-pay

## 2022-10-14 DIAGNOSIS — I10 Essential (primary) hypertension: Secondary | ICD-10-CM

## 2022-10-14 LAB — BASIC METABOLIC PANEL
BUN/Creatinine Ratio: 13 (ref 12–28)
BUN: 15 mg/dL (ref 8–27)
CO2: 27 mmol/L (ref 20–29)
Calcium: 9.3 mg/dL (ref 8.7–10.3)
Chloride: 97 mmol/L (ref 96–106)
Creatinine, Ser: 1.12 mg/dL — ABNORMAL HIGH (ref 0.57–1.00)
Glucose: 87 mg/dL (ref 70–99)
Potassium: 3.6 mmol/L (ref 3.5–5.2)
Sodium: 136 mmol/L (ref 134–144)
eGFR: 53 mL/min/{1.73_m2} — ABNORMAL LOW (ref >59)

## 2022-10-14 LAB — BRAIN NATRIURETIC PEPTIDE: BNP: 37.1 pg/mL (ref 0.0–100.0)

## 2022-10-14 NOTE — Telephone Encounter (Addendum)
Results called to patient who verbalizes understanding! Labs ordered and mailed to patient.   ----- Message from Alver Sorrow, NP sent at 10/14/2022  4:24 PM EDT ----- BNP with no volume overload which is a good result.  Kidney function just slightly decreased from previous.    Recommend adjust Torsemide to take  on every day except Sunday and Wednesday for protection of kidney function  BMP in 2-3 weeks for monitoring.

## 2022-11-01 LAB — BASIC METABOLIC PANEL
BUN/Creatinine Ratio: 17 (ref 12–28)
BUN: 16 mg/dL (ref 8–27)
CO2: 27 mmol/L (ref 20–29)
Calcium: 9.4 mg/dL (ref 8.7–10.3)
Chloride: 96 mmol/L (ref 96–106)
Creatinine, Ser: 0.95 mg/dL (ref 0.57–1.00)
Glucose: 89 mg/dL (ref 70–99)
Potassium: 4 mmol/L (ref 3.5–5.2)
Sodium: 136 mmol/L (ref 134–144)
eGFR: 65 mL/min/{1.73_m2} (ref >59)

## 2022-11-01 LAB — BRAIN NATRIURETIC PEPTIDE: BNP: 49.8 pg/mL (ref 0.0–100.0)

## 2022-11-27 ENCOUNTER — Ambulatory Visit: Payer: PRIVATE HEALTH INSURANCE

## 2022-11-29 ENCOUNTER — Ambulatory Visit: Payer: Medicare Other

## 2022-11-29 VITALS — BP 146/85 | HR 59 | Temp 97.9°F | Resp 20 | Ht 65.0 in | Wt 297.6 lb

## 2022-11-29 DIAGNOSIS — J4550 Severe persistent asthma, uncomplicated: Secondary | ICD-10-CM | POA: Diagnosis not present

## 2022-11-29 MED ORDER — BENRALIZUMAB 30 MG/ML ~~LOC~~ SOSY
30.0000 mg | PREFILLED_SYRINGE | Freq: Once | SUBCUTANEOUS | Status: AC
  Administered 2022-11-29: 30 mg via SUBCUTANEOUS
  Filled 2022-11-29: qty 1

## 2022-11-29 NOTE — Progress Notes (Signed)
Diagnosis: Asthma  Provider:  Chilton Greathouse MD  Procedure: Injection  Fasenra (Benralizumab), Dose: 30 mg, Site: subcutaneous, Number of injections: 1  Post Care: Patient declined observation  Discharge: Condition: Good, Destination: Home . AVS Declined  Performed by:  Adriana Mccallum, RN

## 2022-12-02 ENCOUNTER — Telehealth: Payer: Self-pay | Admitting: Family

## 2022-12-02 ENCOUNTER — Encounter (HOSPITAL_BASED_OUTPATIENT_CLINIC_OR_DEPARTMENT_OTHER): Payer: Self-pay | Admitting: Pulmonary Disease

## 2022-12-02 ENCOUNTER — Telehealth: Payer: Self-pay | Admitting: Pulmonary Disease

## 2022-12-02 MED ORDER — METOPROLOL SUCCINATE ER 25 MG PO TB24: 25.0000 mg | ORAL_TABLET | Freq: Two times a day (BID) | ORAL | 1 refills | Status: DC

## 2022-12-02 NOTE — Telephone Encounter (Signed)
She will be traveling and needs a RX (Prescription), she says,  for her CPAP machine so she can travel by air. Seems the TSA needs it to clear her for take off.  Mail this to her home.  [phone]  She is not taking her nebulizer with her and would like Symbicort RX sent in.   Traveling July 6th.

## 2022-12-02 NOTE — Telephone Encounter (Signed)
*  STAT* If patient is at the pharmacy, call can be transferred to refill team.   1. Which medications need to be refilled? (please list name of each medication and dose if known) metoprolol succinate (TOPROL-XL) 25 MG 24 hr tablet   2. Which pharmacy/location (including street and city if local pharmacy) is medication to be sent to?  [id number] - [place], [place] - [address]    3. Do they need a 30 day or 90 day supply? 90

## 2022-12-02 NOTE — Telephone Encounter (Signed)
Rx request sent to pharmacy.  

## 2022-12-04 NOTE — Telephone Encounter (Signed)
VS, please advise.  

## 2022-12-05 NOTE — Telephone Encounter (Signed)
She has not been on symbicort.  I don't see any prior requests for symbicort.

## 2022-12-05 NOTE — Telephone Encounter (Signed)
Patient called to check on her request for Symbicort.  Please call patient to give her the status of the request.  CB# [phone]

## 2022-12-06 NOTE — Telephone Encounter (Signed)
Please see the very first note I made for this P in this encounter. . I am not sure why my message was intercepted with a RX requests by my esteemed colleague.   PT states that she travels soon with a cpap machine and it is required that she show a written RX to [employer] showing that she has medical need  to carry her machine with her on the flight. I hope that clears this up and I thank you for calling her to advise action taken.

## 2022-12-09 ENCOUNTER — Encounter: Payer: Self-pay | Admitting: Pulmonary Disease

## 2022-12-09 NOTE — Telephone Encounter (Signed)
Called and spoke with patient. She is aware that Dr. Craige Cotta has signed the letter for her cpap. She confirmed her address. I advised her that I would place the letter in the mail today for her.   Nothing further needed at time of call.

## 2022-12-09 NOTE — Telephone Encounter (Signed)
Letter completed and given to Cherina at [place] office.

## 2022-12-11 NOTE — Telephone Encounter (Signed)
Called and spoke with patient on Monday. She is aware that the letter has been placed in the mail for her. Confirmed her address. Will close this encounter.

## 2023-01-03 ENCOUNTER — Ambulatory Visit (HOSPITAL_BASED_OUTPATIENT_CLINIC_OR_DEPARTMENT_OTHER): Payer: PRIVATE HEALTH INSURANCE | Admitting: Family

## 2023-01-06 ENCOUNTER — Encounter: Payer: Self-pay | Admitting: Pulmonary Disease

## 2023-01-06 ENCOUNTER — Encounter: Payer: Self-pay | Admitting: Internal Medicine

## 2023-01-07 ENCOUNTER — Other Ambulatory Visit (HOSPITAL_BASED_OUTPATIENT_CLINIC_OR_DEPARTMENT_OTHER): Payer: Self-pay | Admitting: Family

## 2023-01-07 ENCOUNTER — Telehealth (HOSPITAL_BASED_OUTPATIENT_CLINIC_OR_DEPARTMENT_OTHER): Payer: Self-pay

## 2023-01-07 ENCOUNTER — Encounter (HOSPITAL_BASED_OUTPATIENT_CLINIC_OR_DEPARTMENT_OTHER): Payer: Self-pay | Admitting: Family

## 2023-01-07 ENCOUNTER — Ambulatory Visit (INDEPENDENT_AMBULATORY_CARE_PROVIDER_SITE_OTHER): Payer: Medicare Other | Admitting: Family

## 2023-01-07 VITALS — BP 114/71 | HR 65 | Ht 65.0 in | Wt 301.4 lb

## 2023-01-07 DIAGNOSIS — M79602 Pain in left arm: Secondary | ICD-10-CM

## 2023-01-07 DIAGNOSIS — G4733 Obstructive sleep apnea (adult) (pediatric): Secondary | ICD-10-CM

## 2023-01-07 DIAGNOSIS — I10 Essential (primary) hypertension: Secondary | ICD-10-CM

## 2023-01-07 DIAGNOSIS — D509 Iron deficiency anemia, unspecified: Secondary | ICD-10-CM

## 2023-01-07 DIAGNOSIS — I700 Atherosclerosis of aorta: Secondary | ICD-10-CM | POA: Diagnosis not present

## 2023-01-07 DIAGNOSIS — E538 Deficiency of other specified B group vitamins: Secondary | ICD-10-CM

## 2023-01-07 DIAGNOSIS — R7303 Prediabetes: Secondary | ICD-10-CM

## 2023-01-07 DIAGNOSIS — R252 Cramp and spasm: Secondary | ICD-10-CM | POA: Diagnosis not present

## 2023-01-07 DIAGNOSIS — Z9189 Other specified personal risk factors, not elsewhere classified: Secondary | ICD-10-CM

## 2023-01-07 DIAGNOSIS — Z6841 Body Mass Index (BMI) 40.0 and over, adult: Secondary | ICD-10-CM

## 2023-01-07 MED ORDER — SEMAGLUTIDE-WEIGHT MANAGEMENT 1.7 MG/0.75ML ~~LOC~~ SOAJ
1.7000 mg | SUBCUTANEOUS | 0 refills | Status: DC
Start: 2023-04-04 — End: 2023-01-10

## 2023-01-07 MED ORDER — SEMAGLUTIDE-WEIGHT MANAGEMENT 0.5 MG/0.5ML ~~LOC~~ SOAJ
0.5000 mg | SUBCUTANEOUS | 0 refills | Status: DC
Start: 2023-02-05 — End: 2023-01-10

## 2023-01-07 MED ORDER — SEMAGLUTIDE-WEIGHT MANAGEMENT 0.25 MG/0.5ML ~~LOC~~ SOAJ
0.2500 mg | SUBCUTANEOUS | 0 refills | Status: DC
Start: 2023-01-07 — End: 2023-01-10

## 2023-01-07 MED ORDER — SEMAGLUTIDE-WEIGHT MANAGEMENT 1 MG/0.5ML ~~LOC~~ SOAJ
1.0000 mg | SUBCUTANEOUS | 0 refills | Status: DC
Start: 2023-03-06 — End: 2023-01-10

## 2023-01-07 MED ORDER — SEMAGLUTIDE-WEIGHT MANAGEMENT 2.4 MG/0.75ML ~~LOC~~ SOAJ
2.4000 mg | SUBCUTANEOUS | 0 refills | Status: DC
Start: 2023-05-03 — End: 2023-01-10

## 2023-01-07 NOTE — Progress Notes (Unsigned)
Cardiology Office Note:  .   Date:  01/07/2023  ID:  VERENA SHAWGO, DOB 10/13/1950, MRN [id number] PCP: Kathlee Nations, MD  [facility] Providers Cardiologist:  Chilton Si, MD { Click to update primary MD,subspecialty MD or APP then REFRESH:1}   History of Present Illness: Marland Kitchen   Tamara Daugherty is a 72 y.o. female *** with a hx of asthma, GERD, TIA, hypothyroidism, hypertension, sleep apnea on CPAP, obesity, IDA, mitral regurgitation last seen 10/04/22.  Previously evaluated Dr. Virgilio Belling 1 in 2018 with echocardiogram normal LVEF, mild MR.  Cardiac catheterization greater than 20 years ago unremarkable.  Last seen 05/08/2022 noting dyspnea in the setting of anemia and BRBPR. Was transferred to ER but left without being seen.  She followed with hematology and was treated with IV iron.   Saw Dr. Craige Cotta of pulmonology 09/16/2022 with lingering cough after COVID treating with Depo-Medrol and Z-Pak.  She was recommended to schedule overdue follow-up with cardiology.  Last seen 10/04/22 noting exertional dyspnea for which torsemide transition from as needed to 20 mg daily.   Just got back from [place] last week to celebrate her 72th birthday. Does note some dyspnea with exertion which is worse with the heat.   Notes she is having some "excutiating" pain in her left arm and shoulder blade. This has been going on for 3-4 days. It is intermittent describes as pressure. It occurs at rest and is not exacerbated by activity. Most notable when the arm is the same spot. Feels like a bad muscle spasm per her report.  It does get better with some ointment (sounds like an icy hot). No chest pain, pressure, tightness.   She notes again that she is frustrated by obesity. Previously took Ozempic but it became cost prohibitive. She is interested in started [facility]. No formal exercise routine.   Wearing her CPAP regularly.    B12 ???  ROS: Please see the history of present illness.    All other systems  reviewed and are negative.   Studies Reviewed: Marland Kitchen   EKG Interpretation Date/Time:  Tuesday January 07 2023 13:35:24 EDT Ventricular Rate:  65 PR Interval:  246 QRS Duration:  106 QT Interval:  422 QTC Calculation: 438 R Axis:   -49  Text Interpretation: Sinus rhythm with 1st degree A-V block  No acute ST/T wave changes. Confirmed by Gillian Shields ([id number]) on 01/07/2023 1:40:35 PM    Cardiac Studies & Procedures       ECHOCARDIOGRAM  ECHOCARDIOGRAM COMPLETE 12/26/2021  Narrative ECHOCARDIOGRAM LIMITED REPORT    Patient Name:   Tamara Daugherty Date of Exam: 12/26/2021 Medical Rec #:  [id number]     Height:       65.0 in Accession #:    [id number]    Weight:       290.2 lb Date of Birth:  [birth year]      BSA:          2.317 m Patient Age:    71 years      BP:           124/70 mmHg Patient Gender: F             HR:           60 bpm. Exam Location:  [address]  Procedure: 2D Echo, 3D Echo, Cardiac Doppler and Color Doppler  Indications:    I34.0 Mitral Valve Insufficiency  History:        Patient has prior history  of Echocardiogram examinations, most recent 01/16/2017. TIA, Signs/Symptoms:Chest Pain, Shortness of Breath, Murmur and Edema; Risk Factors:Hypertension, Dyslipidemia, Family History of Coronary Artery Disease and Sleep Apnea. Restrictive Lung Disease, Asthma, Obesity.  Sonographer:    Farrel Conners RDCS Referring Phys: Maisie Fus  IMPRESSIONS   1. Left ventricular ejection fraction, by estimation, is 55%. The left ventricle has no regional wall motion abnormalities. Left ventricular diastolic parameters are consistent with Grade I diastolic dysfunction (impaired relaxation). 2. Right ventricular systolic function is normal. The right ventricular size is normal. There is normal pulmonary artery systolic pressure. The estimated right ventricular systolic pressure is 35.0 mmHg. 3. Left atrial size was moderately dilated. 4. Right atrial size was mildly  dilated. 5. The mitral valve is abnormal. Moderate mitral valve regurgitation (not well-visualized). No evidence of mitral stenosis. 6. The aortic valve is tricuspid. There is mild calcification of the aortic valve. Aortic valve regurgitation is not visualized. No aortic stenosis is present. 7. The inferior vena cava is dilated in size with >50% respiratory variability, suggesting right atrial pressure of 8 mmHg.  FINDINGS Left Ventricle: Left ventricular ejection fraction, by estimation, is 55%. The left ventricle has no regional wall motion abnormalities. The left ventricular internal cavity size was normal in size. There is no left ventricular hypertrophy. Left ventricular diastolic parameters are consistent with Grade I diastolic dysfunction (impaired relaxation).  Right Ventricle: The right ventricular size is normal. No increase in right ventricular wall thickness. Right ventricular systolic function is normal. There is normal pulmonary artery systolic pressure. The tricuspid regurgitant velocity is 2.60 m/s, and with an assumed right atrial pressure of 8 mmHg, the estimated right ventricular systolic pressure is 35.0 mmHg.  Left Atrium: Left atrial size was moderately dilated.  Right Atrium: Right atrial size was mildly dilated.  Mitral Valve: The mitral valve is abnormal. Moderate mitral valve regurgitation. No evidence of mitral valve stenosis.  Tricuspid Valve: The tricuspid valve is normal in structure. Tricuspid valve regurgitation is trivial.  Aortic Valve: The aortic valve is tricuspid. There is mild calcification of the aortic valve. Aortic valve regurgitation is not visualized. No aortic stenosis is present. Aortic valve mean gradient measures 6.0 mmHg. Aortic valve peak gradient measures 11.2 mmHg. Aortic valve area, by VTI measures 4.13 cm.  Pulmonic Valve: The pulmonic valve was normal in structure. Pulmonic valve regurgitation is trivial.  Aorta: The aortic root is  normal in size and structure.  Venous: The inferior vena cava is dilated in size with greater than 50% respiratory variability, suggesting right atrial pressure of 8 mmHg.  IAS/Shunts: No atrial level shunt detected by color flow Doppler.  LEFT VENTRICLE PLAX 2D LVIDd:         5.20 cm   Diastology LVIDs:         3.10 cm   LV e' medial:    7.83 cm/s LV PW:         0.90 cm   LV E/e' medial:  11.9 LV IVS:        1.20 cm   LV e' lateral:   11.10 cm/s LVOT diam:     2.80 cm   LV E/e' lateral: 8.4 LV SV:         167 LV SV Index:   72 LVOT Area:     6.16 cm  3D Volume EF: 3D EF:        66 % LV EDV:       214 ml LV ESV:  72 ml LV SV:        142 ml  RIGHT VENTRICLE RV Basal diam:  4.40 cm RV Mid diam:    2.80 cm RV S prime:     16.00 cm/s TAPSE (M-mode): 3.4 cm  LEFT ATRIUM            Index        RIGHT ATRIUM           Index LA diam:      3.40 cm  1.47 cm/m   RA Area:     20.30 cm LA Vol (A2C): 79.1 ml  34.14 ml/m  RA Volume:   64.40 ml  27.79 ml/m LA Vol (A4C): 117.0 ml 50.49 ml/m AORTIC VALVE AV Area (Vmax):    3.99 cm AV Area (Vmean):   4.08 cm AV Area (VTI):     4.13 cm AV Vmax:           167.50 cm/s AV Vmean:          111.000 cm/s AV VTI:            0.406 m AV Peak Grad:      11.2 mmHg AV Mean Grad:      6.0 mmHg LVOT Vmax:         108.50 cm/s LVOT Vmean:        73.600 cm/s LVOT VTI:          0.272 m LVOT/AV VTI ratio: 0.67  AORTA Ao Root diam: 3.40 cm Ao Asc diam:  2.85 cm  MITRAL VALVE                TRICUSPID VALVE MV Area (PHT): cm          TR Peak grad:   27.0 mmHg MV Decel Time: 229 msec     TR Vmax:        260.00 cm/s MR Peak grad: 109.8 mmHg MR Mean grad: 73.0 mmHg     SHUNTS MR Vmax:      524.00 cm/s   Systemic VTI:  0.27 m MR Vmean:     399.0 cm/s    Systemic Diam: 2.80 cm MV E velocity: 93.33 cm/s MV A velocity: 113.33 cm/s MV E/A ratio:  0.82  Dalton McleanMD Electronically signed by Wilfred Lacy Signature Date/Time:  12/26/2021/9:49:23 PM    Final             Risk Assessment/Calculations:   {Does this patient have ATRIAL FIBRILLATION?[phone]}         Physical Exam:   VS:  BP 114/71 (BP Location: Left Arm, Patient Position: Sitting, Cuff Size: Large)   Pulse 65   Ht 5\' 5"  (1.651 m)   Wt (!) 301 lb 6.4 oz (136.7 kg)   BMI 50.16 kg/m    Wt Readings from Last 3 Encounters:  01/07/23 (!) 301 lb 6.4 oz (136.7 kg)  11/29/22 297 lb 9.6 oz (135 kg)  10/04/22 293 lb 4.8 oz (133 kg)    GEN: Well nourished, well developed in no acute distress NECK: No JVD; No carotid bruits CARDIAC: ***RRR, no murmurs, rubs, gallops RESPIRATORY:  Clear to auscultation without rales, wheezing or rhonchi  ABDOMEN: Soft, non-tender, non-distended EXTREMITIES:  No edema; No deformity   ASSESSMENT AND PLAN: .    Asthma - follows with Dr. Craige Cotta of pulmonology.***   Aortic atherosclerosis - Noted by prior CT. No chest pain suggestive of angina. EKG today no acute ST/T wave changes. ***  OSA -  CPAP compliance encouraged.  Endorses using routinely.***   Exertional dyspnea/diastolic dysfunction- 3 month history of exertional dyspnea. Likely factorial deconditioning, obesity, diastolic dysfunction, asthma.  Plan to transition her torsemide from as needed to 20 mg daily.  BMP/BNP in 1 week.  Will check in via phone call in 1 week.  If symptoms improved plan to continue torsemide.  If symptoms not improved could consider sooner echocardiogram to reassess mitral valve.  Encouraged to gradually increase physical activity.***   HTN- BP well controlled. Continue current antihypertensive regimen.     MR-echo 12/26/2021 normal LVEF 55%, grade 1 diastolic dysfunction, normal PASP, moderate MR.  Consider repeat echo 12/2022.  Recommend optimal BP and volume control, as above.***   Obesity - Weight loss via diet and exercise encouraged. Discussed the impact being overweight would have on cardiovascular risk. Plan to submit prior  authorization for [facility] for cardiovascular risk reduction, as above.    IDA-follows with Dr. Arbutus Ped of hematology.    {Are you ordering a CV Procedure (e.g. stress test, cath, DCCV, TEE, etc)?   Press F2        :[id number]}  Dispo: ***  Signed, Alver Sorrow, NP

## 2023-01-07 NOTE — Patient Instructions (Addendum)
Medication Instructions:  Your physician has recommended you make the following change in your medication:   Aleve 2 tablets twice per day for 3 days If your arm pain does not improve recommend follow up with your primary care provider  START Wegovy  Take 0.25mg  weekly for 1 month Then take 0.5 mg weekly for 1 month Then take 1mg  weekly for 1 month Then take 1.7mg  weekly for 1 month Then take 2.4mg  weekly for 1 month  *If you need a refill on your cardiac medications before your next appointment, please call your pharmacy*   Lab Work: Your physician recommends that you return for lab work today: BMP, magnesium, A1c, B12  If you have labs (blood work) drawn today and your tests are completely normal, you will receive your results only by: MyChart Message (if you have MyChart) OR A paper copy in the mail If you have any lab test that is abnormal or we need to change your treatment, we will call you to review the results.   Testing/Procedures: Your EKG today looked good!  Follow-Up: At [facility], you and your health needs are our priority.  As part of our continuing mission to provide you with exceptional heart care, we have created designated Provider Care Teams.  These Care Teams include your primary Cardiologist (physician) and Advanced Practice Providers (APPs -  Physician Assistants and Nurse Practitioners) who all work together to provide you with the care you need, when you need it.  We recommend signing up for the patient portal called "MyChart".  Sign up information is provided on this After Visit Summary.  MyChart is used to connect with patients for Virtual Visits (Telemedicine).  Patients are able to view lab/test results, encounter notes, upcoming appointments, etc.  Non-urgent messages can be sent to your provider as well.   To learn more about what you can do with MyChart, go to [email or web address].    Your next appointment:   3 months  Provider:    Chilton Si, MD or Gillian Shields, NP    Other Instructions   Heart Healthy Diet Recommendations: A low-salt diet is recommended. Meats should be grilled, baked, or boiled. Avoid fried foods. Focus on lean protein sources like fish or chicken with vegetables and fruits. The American Heart Association is a Chief Technology Officer!  American Heart Association Diet and Lifeystyle Recommendations   Exercise recommendations: The American Heart Association recommends 150 minutes of moderate intensity exercise weekly. Try 30 minutes of moderate intensity exercise 4-5 times per week. This could include walking, jogging, or swimming.   If Reginal Lutes is not approved, we may consider having you see: [facility] [address] [place], [place] [address] Call: [phone]

## 2023-01-07 NOTE — Telephone Encounter (Signed)
Please check prior auth for wegovy for increased cardiovascular risk and aortic arthrosclerosis.

## 2023-01-08 ENCOUNTER — Telehealth: Payer: Self-pay

## 2023-01-08 ENCOUNTER — Encounter (HOSPITAL_BASED_OUTPATIENT_CLINIC_OR_DEPARTMENT_OTHER): Payer: Self-pay | Admitting: Family

## 2023-01-08 ENCOUNTER — Encounter: Payer: Self-pay | Admitting: Pulmonary Disease

## 2023-01-08 ENCOUNTER — Encounter: Payer: Self-pay | Admitting: Internal Medicine

## 2023-01-08 ENCOUNTER — Other Ambulatory Visit (HOSPITAL_COMMUNITY): Payer: Self-pay

## 2023-01-08 LAB — MAGNESIUM: Magnesium: 2 mg/dL (ref 1.6–2.3)

## 2023-01-08 LAB — BASIC METABOLIC PANEL
BUN/Creatinine Ratio: 11 — ABNORMAL LOW (ref 12–28)
BUN: 14 mg/dL (ref 8–27)
CO2: 27 mmol/L (ref 20–29)
Calcium: 9.7 mg/dL (ref 8.7–10.3)
Chloride: 96 mmol/L (ref 96–106)
Creatinine, Ser: 1.22 mg/dL — ABNORMAL HIGH (ref 0.57–1.00)
Glucose: 86 mg/dL (ref 70–99)
Potassium: 4.5 mmol/L (ref 3.5–5.2)
Sodium: 136 mmol/L (ref 134–144)
eGFR: 48 mL/min/{1.73_m2} — ABNORMAL LOW (ref >59)

## 2023-01-08 LAB — HEMOGLOBIN A1C
Est. average glucose Bld gHb Est-mCnc: 117 mg/dL
Hgb A1c MFr Bld: 5.7 % — ABNORMAL HIGH (ref 4.8–5.6)

## 2023-01-08 LAB — B12 AND FOLATE PANEL
Folate: >20 ng/mL (ref >3.0)
Vitamin B-12: 883 pg/mL (ref 232–1245)

## 2023-01-08 NOTE — Telephone Encounter (Signed)
Pharmacy Patient Advocate Encounter  Received notification from [facility] that Prior Authorization for [facility] has been APPROVED from 01/06/23 to 01/06/24.Marland Kitchen

## 2023-01-08 NOTE — Telephone Encounter (Signed)
Pharmacy Patient Advocate Encounter   Received notification from Pt Calls Messages that prior authorization for [facility] is required/requested.   Insurance verification completed.   The patient is insured through [facility] .   Per test claim: PA submitted to [facility] via CoverMyMeds Key/confirmation #/EOC [id number] Status is pending

## 2023-01-08 NOTE — Telephone Encounter (Signed)
PA request has been Submitted. New Encounter created for follow up. For additional info see Pharmacy Prior Auth telephone encounter from 01/08/23.

## 2023-01-08 NOTE — Telephone Encounter (Addendum)
Haze Rushing, [place] at 01/08/2023 12:18 PM  Status: Signed  Pharmacy Patient Advocate Encounter   Received notification from [facility] that Prior Authorization for [facility] has been APPROVED from 01/06/23 to 01/06/24..      Mychart message sent to patient

## 2023-01-08 NOTE — Telephone Encounter (Signed)
Mychart message sent to patient, see other 7/17 phone message

## 2023-01-09 ENCOUNTER — Other Ambulatory Visit (HOSPITAL_BASED_OUTPATIENT_CLINIC_OR_DEPARTMENT_OTHER): Payer: Self-pay | Admitting: Pulmonary Disease

## 2023-01-10 MED ORDER — SEMAGLUTIDE-WEIGHT MANAGEMENT 1 MG/0.5ML ~~LOC~~ SOAJ: 1.0000 mg | SUBCUTANEOUS | 0 refills | Status: AC

## 2023-01-10 MED ORDER — SEMAGLUTIDE-WEIGHT MANAGEMENT 1.7 MG/0.75ML ~~LOC~~ SOAJ: 1.7000 mg | SUBCUTANEOUS | 0 refills | Status: AC

## 2023-01-10 MED ORDER — SEMAGLUTIDE-WEIGHT MANAGEMENT 0.5 MG/0.5ML ~~LOC~~ SOAJ: 0.5000 mg | SUBCUTANEOUS | 0 refills | Status: AC

## 2023-01-10 MED ORDER — SEMAGLUTIDE-WEIGHT MANAGEMENT 0.25 MG/0.5ML ~~LOC~~ SOAJ: 0.2500 mg | SUBCUTANEOUS | 0 refills | Status: AC

## 2023-01-10 MED ORDER — SEMAGLUTIDE-WEIGHT MANAGEMENT 2.4 MG/0.75ML ~~LOC~~ SOAJ: 2.4000 mg | SUBCUTANEOUS | 0 refills | Status: AC

## 2023-01-10 NOTE — Addendum Note (Signed)
Addended by: Marlene Lard on: 01/10/2023 09:06 AM   Modules accepted: Orders

## 2023-01-10 NOTE — Addendum Note (Signed)
Addended by: Marlene Lard on: 01/10/2023 09:38 AM   Modules accepted: Orders

## 2023-01-13 ENCOUNTER — Other Ambulatory Visit: Payer: Self-pay

## 2023-01-13 ENCOUNTER — Inpatient Hospital Stay: Payer: Medicare Other | Attending: Internal Medicine

## 2023-01-13 ENCOUNTER — Encounter: Payer: Self-pay | Admitting: Pulmonary Disease

## 2023-01-13 ENCOUNTER — Encounter: Payer: Self-pay | Admitting: Internal Medicine

## 2023-01-13 ENCOUNTER — Inpatient Hospital Stay (HOSPITAL_BASED_OUTPATIENT_CLINIC_OR_DEPARTMENT_OTHER): Payer: Medicare Other | Admitting: Internal Medicine

## 2023-01-13 VITALS — BP 143/83 | HR 57 | Temp 97.8°F | Resp 16 | Ht 65.0 in | Wt 299.2 lb

## 2023-01-13 DIAGNOSIS — K909 Intestinal malabsorption, unspecified: Secondary | ICD-10-CM | POA: Diagnosis present

## 2023-01-13 DIAGNOSIS — D508 Other iron deficiency anemias: Secondary | ICD-10-CM

## 2023-01-13 DIAGNOSIS — D500 Iron deficiency anemia secondary to blood loss (chronic): Secondary | ICD-10-CM

## 2023-01-13 DIAGNOSIS — D509 Iron deficiency anemia, unspecified: Secondary | ICD-10-CM | POA: Insufficient documentation

## 2023-01-13 LAB — FERRITIN: Ferritin: 11 ng/mL (ref 11–307)

## 2023-01-13 LAB — CBC WITH DIFFERENTIAL (CANCER CENTER ONLY)
Abs Immature Granulocytes: 0.01 10*3/uL (ref 0.00–0.07)
Basophils Absolute: 0.1 10*3/uL (ref 0.0–0.1)
Basophils Relative: 1 %
Eosinophils Absolute: 0 10*3/uL (ref 0.0–0.5)
Eosinophils Relative: 0 %
HCT: 36.9 % (ref 36.0–46.0)
Hemoglobin: 12.4 g/dL (ref 12.0–15.0)
Immature Granulocytes: 0 %
Lymphocytes Relative: 38 %
Lymphs Abs: 2.1 10*3/uL (ref 0.7–4.0)
MCH: 32.1 pg (ref 26.0–34.0)
MCHC: 33.6 g/dL (ref 30.0–36.0)
MCV: 95.6 fL (ref 80.0–100.0)
Monocytes Absolute: 0.6 10*3/uL (ref 0.1–1.0)
Monocytes Relative: 10 %
Neutro Abs: 2.9 10*3/uL (ref 1.7–7.7)
Neutrophils Relative %: 51 %
Platelet Count: 291 10*3/uL (ref 150–400)
RBC: 3.86 MIL/uL — ABNORMAL LOW (ref 3.87–5.11)
RDW: 14 % (ref 11.5–15.5)
WBC Count: 5.6 10*3/uL (ref 4.0–10.5)
nRBC: 0 % (ref 0.0–0.2)

## 2023-01-13 LAB — IRON AND IRON BINDING CAPACITY (CC-WL,HP ONLY)
Iron: 69 ug/dL (ref 28–170)
Saturation Ratios: 18 % (ref 10.4–31.8)
TIBC: 388 ug/dL (ref 250–450)
UIBC: 319 ug/dL (ref 148–442)

## 2023-01-13 NOTE — Progress Notes (Signed)
[facility] Telephone:([phone]) [phone]   Fax:([phone]) [phone]  OFFICE PROGRESS NOTE  Kathlee Nations, MD [address] [place] [address]  DIAGNOSIS: Persistent iron deficiency anemia with no clear etiology except for possibility of dietary insufficiency and the patient did not have any improvement in her anemia with the oral iron tablet  PRIOR THERAPY: Iron infusion with Venofer 300 mg IV weekly for 3 weeks.  CURRENT THERAPY: Vitron-C 1 tablet p.o. daily.  INTERVAL HISTORY: Tamara Daugherty 72 y.o. female returns to the clinic today for 61-month follow-up visit.  The patient is feeling fine today with no concerning complaints.  She denied having any significant fatigue or weakness.  She continues to have osteoarthritis.  She denied having any chest pain, shortness of breath, cough or hemoptysis.  She has no nausea, vomiting, diarrhea or constipation.  She has no headache or visual changes.  She denied having any significant weight loss or night sweats.  She is here today for evaluation with repeat CBC, iron study and ferritin.  MEDICAL HISTORY: Past Medical History:  Diagnosis Date   Allergy    Anemia    takes Iron daily   Anxiety    Arthritis    Asthma    Symbicort daily and Albuterol daily as needed   B12 deficiency    Back pain    Bilateral swelling of feet    Bruises easily    d/t being on Plavix    Chest pain    Complication of anesthesia    long time to wake up - referred to pulmonary after carpel tunnel procedure - for asthma and undiagnosed sleep apnea   Constipation    Depression    GERD (gastroesophageal reflux disease)    takes Protonix daily   Headache    Heart murmur    [facility] [place] [facility]   Heart murmur    History of blood transfusion    no abnormal reaction noted   History of colon polyps    beningn   History of migraine    couple of wks ago was the last one   Hyperlipidemia    takes Atorvastatin daily   Hypertension     takes Benicar and Diltiazem daily   Hypothyroidism    takes Synthroid daily   Itching    takes Atarax daily as needed   Joint pain    Joint pain    Joint swelling    Lactose intolerance    Leg swelling    Liver cyst    Mini stroke    Multiple allergies    takes Claritin daily as needed as well as using Flonase if needed   Muscle spasm    takes Baclofen if needed   Osteoarthritis    Peripheral edema    takes Torsemide daily   Pneumonia    hx of   Prediabetes    Primary localized osteoarthritis of left knee 06/21/2014   Primary localized osteoarthritis of right knee 03/21/2015   Restrictive lung disease    Sleep apnea    cpap   Sleep apnea    Swallowing difficulty    TIA (transient ischemic attack)    takes Plavix daily;notices right side is slightly weaker than left   Vertigo    takes Meclizine daily as needed   Vitamin D deficiency     ALLERGIES:  is allergic to bee venom, diphenhydramine hcl, diphenhydramine hcl, percocet [oxycodone-acetaminophen], propoxyphene, sulfa antibiotics, doxycycline, oxycodone-acetaminophen, and penicillins.  MEDICATIONS:  Current Outpatient Medications  Medication Sig Dispense Refill   albuterol (VENTOLIN HFA) 108 (90 Base) MCG/ACT inhaler TAKE 2 PUFFS BY MOUTH EVERY 6 HOURS AS NEEDED FOR WHEEZE OR SHORTNESS OF BREATH 18 each 3   atorvastatin (LIPITOR) 10 MG tablet Take 10 mg by mouth daily.     budesonide (PULMICORT) 0.5 MG/2ML nebulizer solution USE 1 VIAL  IN  NEBULIZER TWICE  DAILY - At 10 AM And 5 PM - Rinse Mouth After Treatment 60 mL 11   clopidogrel (PLAVIX) 75 MG tablet Take 1 tablet by mouth daily.     diltiazem (CARDIZEM CD) 240 MG 24 hr capsule Take 240 mg by mouth daily.     EPINEPHrine 0.3 mg/0.3 mL IJ SOAJ injection Inject 0.3 mg into the muscle once.     fluticasone (FLONASE) 50 MCG/ACT nasal spray Place 1 spray into both nostrils 2 (two) times daily as needed for allergies or rhinitis.     formoterol (PERFOROMIST) 20 MCG/2ML  nebulizer solution USE 1 VIAL  IN  NEBULIZER TWICE  DAILY - - Morning And Evening 60 mL 11   guaiFENesin (MUCINEX) 600 MG 12 hr tablet Take 600 mg by mouth 2 (two) times daily as needed for to loosen phlegm.     hydrALAZINE (APRESOLINE) 25 MG tablet TAKE 1 TABLET BY MOUTH EVERY DAY IN THE MORNING AND AT BEDTIME     ipratropium-albuterol (DUONEB) 0.5-2.5 (3) MG/3ML SOLN Inhale into the lungs.     Iron-Vitamin C (VITRON-C) 65-125 MG TABS Take by mouth.     levothyroxine (SYNTHROID, LEVOTHROID) 50 MCG tablet Take 50 mcg by mouth daily before breakfast.     loratadine (CLARITIN) 10 MG tablet Take 10 mg by mouth daily as needed for allergies.     metoprolol succinate (TOPROL-XL) 25 MG 24 hr tablet Take 1 tablet (25 mg total) by mouth in the morning and at bedtime. 180 tablet 1   montelukast (SINGULAIR) 10 MG tablet Take 1 tablet (10 mg total) by mouth at bedtime. 90 tablet 3   Multiple Vitamins-Minerals (MULTIVITAMIN WITH MINERALS) tablet Take 1 tablet by mouth daily.     olmesartan-hydrochlorothiazide (BENICAR HCT) 40-25 MG per tablet Take 1 tablet by mouth daily.     Omega-3 Fatty Acids (FISH OIL PO) Take 1 capsule by mouth 2 (two) times daily.     pantoprazole (PROTONIX) 40 MG tablet Take 40 mg by mouth daily.     potassium chloride (MICRO-K) 10 MEQ CR capsule Take 30 mEq by mouth daily.      pyridOXINE (B-6) 50 MG tablet Take 50 mg by mouth daily.     Semaglutide-Weight Management 0.25 MG/0.5ML SOAJ Inject 0.25 mg into the skin once a week for 28 days. 2 mL 0   [START ON 02/08/2023] Semaglutide-Weight Management 0.5 MG/0.5ML SOAJ Inject 0.5 mg into the skin once a week for 28 days. 2 mL 0   [START ON 03/09/2023] Semaglutide-Weight Management 1 MG/0.5ML SOAJ Inject 1 mg into the skin once a week for 28 days. 2 mL 0   [START ON 04/07/2023] Semaglutide-Weight Management 1.7 MG/0.75ML SOAJ Inject 1.7 mg into the skin once a week for 28 days. 3 mL 0   [START ON 05/06/2023] Semaglutide-Weight Management  2.4 MG/0.75ML SOAJ Inject 2.4 mg into the skin once a week for 28 days. 3 mL 0   Spacer/Aero-Holding Chambers (AEROCHAMBER MV) inhaler Use as instructed 1 each 0   torsemide (DEMADEX) 20 MG tablet Take 1 tablet (20 mg total) by mouth daily.  90 tablet 3   TURMERIC PO Take 1 capsule by mouth daily.     Vitamin D, Ergocalciferol, (DRISDOL) 1.25 MG (50000 UNIT) CAPS capsule Take 1 capsule (50,000 Units total) by mouth every 7 (seven) days. 4 capsule 0   No current facility-administered medications for this visit.    SURGICAL HISTORY:  Past Surgical History:  Procedure Laterality Date   ABDOMINAL HYSTERECTOMY     BACK SURGERY     BREAST SURGERY     cyst removal   CARDIAC CATHETERIZATION     carpel tunnel Right    COLONOSCOPY     ESOPHAGOGASTRODUODENOSCOPY     KNEE ARTHROSCOPY     TONSILLECTOMY     TOTAL KNEE ARTHROPLASTY Left 06/21/2014   Procedure: LEFT TOTAL KNEE ARTHROPLASTY;  Surgeon: Eulas Post, MD;  Location: [facility] OR;  Service: Orthopedics;  Laterality: Left;   TOTAL KNEE ARTHROPLASTY Right 03/21/2015   Procedure: TOTAL KNEE ARTHROPLASTY STEROID INECTION BOTH KNEES;  Surgeon: Teryl Lucy, MD;  Location: [facility] OR;  Service: Orthopedics;  Laterality: Right;   trigger thumb      REVIEW OF SYSTEMS:  A comprehensive review of systems was negative.   PHYSICAL EXAMINATION: General appearance: alert, cooperative, and no distress Head: Normocephalic, without obvious abnormality, atraumatic Neck: no adenopathy, no JVD, supple, symmetrical, trachea midline, and thyroid not enlarged, symmetric, no tenderness/mass/nodules Lymph nodes: Cervical, supraclavicular, and axillary nodes normal. Resp: clear to auscultation bilaterally Back: symmetric, no curvature. ROM normal. No CVA tenderness. Cardio: regular rate and rhythm, S1, S2 normal, no murmur, click, rub or gallop GI: soft, non-tender; bowel sounds normal; no masses,  no organomegaly Extremities: extremities normal, atraumatic, no  cyanosis or edema  ECOG PERFORMANCE STATUS: 1 - Symptomatic but completely ambulatory  Blood pressure (!) 143/83, pulse (!) 57, temperature 97.8 F (36.6 C), temperature source Oral, resp. rate 16, height 5\' 5"  (1.651 m), weight 299 lb 3.2 oz (135.7 kg), SpO2 100%.  LABORATORY DATA: Lab Results  Component Value Date   WBC 5.6 01/13/2023   HGB 12.4 01/13/2023   HCT 36.9 01/13/2023   MCV 95.6 01/13/2023   PLT 291 01/13/2023      Chemistry      Component Value Date/Time   NA 136 01/07/2023 1427   K 4.5 01/07/2023 1427   CL 96 01/07/2023 1427   CO2 27 01/07/2023 1427   BUN 14 01/07/2023 1427   CREATININE 1.22 (H) 01/07/2023 1427   CREATININE 1.09 (H) 11/01/2020 1115      Component Value Date/Time   CALCIUM 9.7 01/07/2023 1427   ALKPHOS 49 05/02/2022 1304   AST 16 05/02/2022 1304   AST 14 (L) 11/01/2020 1115   ALT 16 05/02/2022 1304   ALT 12 11/01/2020 1115   BILITOT 0.2 05/02/2022 1304   BILITOT 0.3 11/01/2020 1115       RADIOGRAPHIC STUDIES: No results found.  ASSESSMENT AND PLAN: This is a very pleasant 72 years old African-American female with iron deficiency anemia secondary to malabsorption and lack of benefit to the oral iron tablets. The patient was recently found to have colon polyps that were removed. She also received iron infusion with Venofer for 3 doses and she tolerated it fairly well. The patient is currently on oral iron tablet with Vitron-C.  She is tolerating the oral iron tablet fairly well. Her CBC today showed normal hemoglobin and hematocrit.  Iron study and ferritin are still pending I recommended for the patient to continue on her oral iron tablet for  now.  She will follow-up with her primary care physician at regular basis and see me on as-needed basis from now on. The patient was advised to call immediately if she has any other concerning symptoms in the interval.   The  patient voices understanding of current disease status and treatment  options and is in agreement with the current care plan.  All questions were answered. The patient knows to call the clinic with any problems, questions or concerns. We can certainly see the patient much sooner if necessary.   Disclaimer: This note was dictated with voice recognition software. Similar sounding words can inadvertently be transcribed and may not be corrected upon review.

## 2023-01-17 ENCOUNTER — Encounter: Payer: Self-pay | Admitting: Pulmonary Disease

## 2023-01-17 ENCOUNTER — Encounter: Payer: Self-pay | Admitting: Internal Medicine

## 2023-01-23 ENCOUNTER — Encounter: Payer: Self-pay | Admitting: Internal Medicine

## 2023-01-23 ENCOUNTER — Encounter: Payer: Self-pay | Admitting: Pulmonary Disease

## 2023-01-24 ENCOUNTER — Ambulatory Visit (INDEPENDENT_AMBULATORY_CARE_PROVIDER_SITE_OTHER): Payer: Medicare Other

## 2023-01-24 VITALS — BP 152/83 | HR 60 | Temp 97.9°F | Resp 20 | Ht 65.0 in | Wt 298.0 lb

## 2023-01-24 DIAGNOSIS — J4550 Severe persistent asthma, uncomplicated: Secondary | ICD-10-CM | POA: Diagnosis not present

## 2023-01-24 MED ORDER — BENRALIZUMAB 30 MG/ML ~~LOC~~ SOSY
30.0000 mg | PREFILLED_SYRINGE | Freq: Once | SUBCUTANEOUS | Status: AC
  Administered 2023-01-24: 30 mg via SUBCUTANEOUS
  Filled 2023-01-24: qty 1

## 2023-01-24 NOTE — Progress Notes (Signed)
Diagnosis: Asthma  Provider:  Chilton Greathouse MD  Procedure: Injection  Fasenra (Benralizumab), Dose: 30 mg, Site: subcutaneous, Number of injections: 1  Post Care: Patient declined observation  Discharge: Condition: Good, Destination: Home . AVS Declined  Performed by:  Loney Hering, LPN

## 2023-01-27 ENCOUNTER — Encounter (HOSPITAL_BASED_OUTPATIENT_CLINIC_OR_DEPARTMENT_OTHER): Payer: Self-pay | Admitting: Cardiovascular Disease

## 2023-01-27 ENCOUNTER — Ambulatory Visit (HOSPITAL_BASED_OUTPATIENT_CLINIC_OR_DEPARTMENT_OTHER): Payer: Medicare Other | Admitting: Cardiovascular Disease

## 2023-01-27 VITALS — BP 110/58 | HR 63 | Ht 65.0 in | Wt 297.1 lb

## 2023-01-27 DIAGNOSIS — I340 Nonrheumatic mitral (valve) insufficiency: Secondary | ICD-10-CM

## 2023-01-27 DIAGNOSIS — I10 Essential (primary) hypertension: Secondary | ICD-10-CM | POA: Diagnosis not present

## 2023-01-27 DIAGNOSIS — R0609 Other forms of dyspnea: Secondary | ICD-10-CM

## 2023-01-27 DIAGNOSIS — R0600 Dyspnea, unspecified: Secondary | ICD-10-CM | POA: Insufficient documentation

## 2023-01-27 DIAGNOSIS — Z01812 Encounter for preprocedural laboratory examination: Secondary | ICD-10-CM

## 2023-01-27 DIAGNOSIS — G4733 Obstructive sleep apnea (adult) (pediatric): Secondary | ICD-10-CM

## 2023-01-27 HISTORY — DX: Nonrheumatic mitral (valve) insufficiency: I34.0

## 2023-01-27 NOTE — Patient Instructions (Addendum)
Medication Instructions:  TAKE 2 OF YOUR METOPROLOL DAY OF CT   HOLD YOUR ATORVASTATIN FOR 2 WEEKS AND CALL THE OFFICE TO UPDATE HOW YOU ARE DOING   *If you need a refill on your cardiac medications before your next appointment, please call your pharmacy*  Lab Work: BMET 1 WEEK PRIOR TO CT IF YOU ARE NOT SCHEDULED BEFORE 8/16  If you have labs (blood work) drawn today and your tests are completely normal, you will receive your results only by: MyChart Message (if you have MyChart) OR A paper copy in the mail If you have any lab test that is abnormal or we need to change your treatment, we will call you to review the results.  Testing/Procedures: Your physician has requested that you have cardiac CT. Cardiac computed tomography (CT) is a painless test that uses an x-ray machine to take clear, detailed pictures of your heart. For further information please visit [email or web address] Please follow instruction sheet as given.  Your physician has requested that you have an echocardiogram. Echocardiography is a painless test that uses sound waves to create images of your heart. It provides your doctor with information about the size and shape of your heart and how well your heart's chambers and valves are working. This procedure takes approximately one hour. There are no restrictions for this procedure. Please do NOT wear cologne, perfume, aftershave, or lotions (deodorant is allowed). Please arrive 15 minutes prior to your appointment time.  Follow-Up: At [facility], you and your health needs are our priority.  As part of our continuing mission to provide you with exceptional heart care, we have created designated Provider Care Teams.  These Care Teams include your primary Cardiologist (physician) and Advanced Practice Providers (APPs -  Physician Assistants and Nurse Practitioners) who all work together to provide you with the care you need, when you need it.  We recommend signing up  for the patient portal called "MyChart".  Sign up information is provided on this After Visit Summary.  MyChart is used to connect with patients for Virtual Visits (Telemedicine).  Patients are able to view lab/test results, encounter notes, upcoming appointments, etc.  Non-urgent messages can be sent to your provider as well.   To learn more about what you can do with MyChart, go to [email or web address].    Your next appointment:   2 month(s)  Provider:   Gillian Shields, NP    1 YEAR WITH DR [facility]  Other Instructions    Your cardiac CT will be scheduled at one of the below locations:   [facility] [address] [place], [place] [address] [phone]  OR  [facility] [address] [place], [place] [address] [phone]  OR   [facility] [address] [place], [place] [address] [phone]  If scheduled at [facility], please arrive at the [facility] and Children's Entrance (Entrance C2) of [facility] 30 minutes prior to test start time. You can use the FREE valet parking offered at entrance C (encouraged to control the heart rate for the test)  Proceed to the [facility] Radiology Department (first floor) to check-in and test prep.  All radiology patients and guests should use entrance C2 at [facility], accessed from [facility], even though the hospital's physical address listed is [address]    If scheduled at [facility] or [facility], please arrive 35  mins early for check-in and test prep.  There is spacious parking and easy access to the radiology department from the [facility] Heart and Vascular entrance. Please enter here and check-in with the desk attendant.   Please follow these instructions carefully (unless otherwise directed):  An IV will be required for this test and  Nitroglycerin will be given.  Hold all erectile dysfunction medications at least 3 days (72 hrs) prior to test. (Ie viagra, cialis, sildenafil, tadalafil, etc)   On the Night Before the Test: Be sure to Drink plenty of water. Do not consume any caffeinated/decaffeinated beverages or chocolate 12 hours prior to your test. Do not take any antihistamines 12 hours prior to your test.  On the Day of the Test: Drink plenty of water until 1 hour prior to the test. Do not eat any food 1 hour prior to test. You may take your regular medications prior to the test.  Take metoprolol (Lopressor) two hours prior to test. If you take Furosemide/Hydrochlorothiazide/Spironolactone, please HOLD on the morning of the test. FEMALES- please wear underwire-free bra if available, avoid dresses & tight clothing      After the Test: Drink plenty of water. After receiving IV contrast, you may experience a mild flushed feeling. This is normal. On occasion, you may experience a mild rash up to 24 hours after the test. This is not dangerous. If this occurs, you can take Benadryl 25 mg and increase your fluid intake. If you experience trouble breathing, this can be serious. If it is severe call 911 IMMEDIATELY. If it is mild, please call our office. If you take any of these medications: Glipizide/Metformin, Avandament, Glucavance, please do not take 48 hours after completing test unless otherwise instructed.  We will call to schedule your test 2-4 weeks out understanding that some insurance companies will need an authorization prior to the service being performed.   For more information and frequently asked questions, please visit our website : [email or web address]  For non-scheduling related questions, please contact the cardiac imaging nurse navigator should you have any questions/concerns: Cardiac Imaging Nurse Navigators Direct Office Dial: [phone]   For scheduling needs, including  cancellations and rescheduling, please call [place], [phone].

## 2023-01-27 NOTE — Progress Notes (Signed)
Cardiology Office Note:  .    Date:  01/27/2023  ID:  Varenya, Vanhoesen 06-Oct-1950, MRN [id number] PCP: Kathlee Nations, MD  [facility] Providers Cardiologist:  Chilton Si, MD     History of Present Illness: Marland Kitchen    Tamara Daugherty is a 71 y.o. female with mitral regurgitation, TIA, hypertension, hypothyroidism, prediabetes, asthma, GERD, iron deficiency anemia, obesity, sleep apnea on CPAP, who presents for follow-up and to establish care with me. Previously evaluated by Dr. Purvis Sheffield in 2018. She had a heart cath greater than 20 years ago which was unremarkable. Echo at the time with normal LV function, mild MR. Repeat echo 12/2021 with LVEF 55%, grade 1 diastolic dysfunction, normal PASP, and moderate MR. On 05/08/2022 she was seen by Dr. Wyline Mood with complaints of progressive shortness of breath in the setting of anemia and BRBPR. Was transferred to ER but left without being seen. She followed with hematology and was treated with IV iron. She followed up with Gillian Shields, NP 09/2022 and had ongoing exertional dyspnea felt to be multifactorial (deconditioning, obesity, diastolic dysfunction, asthma). Her torsemide was transitioned from as needed to 20 mg daily. At her office visit 12/2022, she reported improvement in her dyspnea. Initiated MJ.Hock. She had previously tried Ozempic but this became cost prohibitive.   Today, she is accompanied by her husband. She has good and bad days. On her bad days she struggles with worsening dyspnea, especially during hot weather. Her asthma will limit her from exercising more frequently. In the office her blood pressure is 110/58. When she believes her blood pressure is high such as in the setting of a headache, she will check it at home. She usually has readings closer to 117/70. Her blood pressure was noted to be 150/82 during a recent iron infusion which she has received periodically for 2 years.  When hemoglobin was lower at 8 she had significantly worse  fatigue and shortness of breath. For about a month she has experienced left shoulder pain. Can occur while sitting still, no significant change with movement. Does have some right LE muscle cramps as well. No chest pain or pressure. Has had some swelling this Summer usually if she is on her feet for too long, improves with elevation. She is taking torsemide as needed based on the level of swelling in her ankles, not every day. Of note, she states she is unable to lie flat and usually sleeps on one firm pillow. She confirms suffering a prior TIA in 2010, currently on Plavix. At the time, her symptoms included confusion. She denies any palpitations, chest pain, lightheadedness, headaches, syncope, or PND.  ROS:  Please see the history of present illness. All other systems are reviewed and negative.  (+) Shortness of breath (+) Left shoulder pain (+) Right LE muscle cramps (+) Intermittent LE edema to the ankles bilaterally  Studies Reviewed: .        Risk Assessment/Calculations:             Physical Exam:    VS:  BP (!) 110/58 (BP Location: Left Arm, Patient Position: Sitting, Cuff Size: Large)   Pulse 63   Ht 5\' 5"  (1.651 m)   Wt 297 lb 1.6 oz (134.8 kg)   SpO2 93%   BMI 49.44 kg/m  , BMI Body mass index is 49.44 kg/m. GENERAL:  Well appearing HEENT: Pupils equal round and reactive, fundi not visualized, oral mucosa unremarkable NECK:  No jugular venous distention, waveform within normal  limits, carotid upstroke brisk and symmetric, no bruits, no thyromegaly LYMPHATICS:  No cervical adenopathy LUNGS:  Clear to auscultation bilaterally HEART:  RRR.  PMI not displaced or sustained,S1 and S2 within normal limits, no S3, no S4, no clicks, no rubs, II/VI systolic murmurs ABD:  Flat, positive bowel sounds normal in frequency in pitch, no bruits, no rebound, no guarding, no midline pulsatile mass, no hepatomegaly, no splenomegaly EXT:  2 plus pulses throughout, no edema, no cyanosis no  clubbing SKIN:  No rashes no nodules NEURO:  Cranial nerves II through XII grossly intact, motor grossly intact throughout PSYCH:  Cognitively intact, oriented to person place and time  Wt Readings from Last 3 Encounters:  01/27/23 297 lb 1.6 oz (134.8 kg)  01/24/23 298 lb (135.2 kg)  01/13/23 299 lb 3.2 oz (135.7 kg)     ASSESSMENT AND PLAN: .    No problem-specific Assessment & Plan notes found for this encounter. Assessment and Plan    # Moderate mitral regurgitation: # Dyspnea Worsening with heat and exertion, attributed to asthma. Nocturnal dyspnea present but manageable with head elevation. No chest pain. Mitral valve regurgitation noted on previous echocardiogram. -Order repeat echocardiogram to assess mitral valve status and cardiac function. -Order coronary CT angiogram to assess for coronary artery disease.  # Peripheral Edema Noted with prolonged standing, improves with leg elevation. Patient self-adjusting torsemide dose based on edema status. -Advise patient to continue current practice with torsemide, adjusting dose as needed based on edema status.  # Hyperlipidemia LDL slightly above target despite atorvastatin 10mg . Patient reports muscle aches, unclear if statin-related. -Hold atorvastatin for two weeks and reassess muscle aches. -If muscle aches improve, consider alternative cholesterol management. If no change, increase atorvastatin to 20mg  daily. -Plan to recheck lipid panel in a few months.  # History of TIA:  On Plavix for secondary prevention. -Continue Plavix.  Lipid management as above.  Follow-up in 2 months with interim contact as needed for test results and medication adjustments.       Dispo:  FU with APP in 2 months. FU with  C. Duke Salvia, MD, [facility] in 1 year.   I,Mathew Stumpf,acting as a Neurosurgeon for Chilton Si, MD.,have documented all relevant documentation on the behalf of Chilton Si, MD,as directed by  Chilton Si, MD  while in the presence of Chilton Si, MD.  I,  C. Duke Salvia, MD have reviewed all documentation for this visit.  The documentation of the exam, diagnosis, procedures, and orders on 01/27/2023 are all accurate and complete.   Signed, Chilton Si, MD

## 2023-01-28 ENCOUNTER — Telehealth (HOSPITAL_BASED_OUTPATIENT_CLINIC_OR_DEPARTMENT_OTHER): Payer: Self-pay | Admitting: Cardiovascular Disease

## 2023-01-28 NOTE — Telephone Encounter (Signed)
Spoke with  patient regarding the Friday 02/14/23 Cardiac CT appointment at [facility] arrival time is 1:30 pm at the 1st floor radiology department for check in---Echocardiogram appointment is at 3:00 pm here at [facility].  Patient voiced her understanding.

## 2023-02-06 ENCOUNTER — Encounter (HOSPITAL_BASED_OUTPATIENT_CLINIC_OR_DEPARTMENT_OTHER): Payer: Self-pay | Admitting: Pulmonary Disease

## 2023-02-06 ENCOUNTER — Encounter (HOSPITAL_BASED_OUTPATIENT_CLINIC_OR_DEPARTMENT_OTHER): Payer: Self-pay | Admitting: Cardiovascular Disease

## 2023-02-13 ENCOUNTER — Telehealth: Payer: Self-pay | Admitting: Cardiovascular Disease

## 2023-02-13 ENCOUNTER — Telehealth (HOSPITAL_COMMUNITY): Payer: Self-pay | Admitting: *Deleted

## 2023-02-13 LAB — LAB REPORT - SCANNED: EGFR: 71

## 2023-02-13 NOTE — Telephone Encounter (Signed)
Called PCP to get labs, was told needed to call [facility] Did get labs and have placed for Dr Duke Salvia to review when she returns to office

## 2023-02-13 NOTE — Telephone Encounter (Signed)
Patient would like to know if her lab results have been received. Please advise.

## 2023-02-13 NOTE — Telephone Encounter (Signed)
Attempted to call patient regarding upcoming cardiac CT appointment. °Left message on voicemail with name and callback number ° °Merle Prescott RN Navigator Cardiac Imaging °[facility] °[phone] Office °[phone] Cell ° °

## 2023-02-14 ENCOUNTER — Ambulatory Visit (HOSPITAL_COMMUNITY)
Admission: RE | Admit: 2023-02-14 | Discharge: 2023-02-14 | Disposition: A | Discharge status: Home / Self Care | Payer: Medicare Other | Source: Ambulatory Visit | Attending: Cardiovascular Disease | Admitting: Cardiovascular Disease

## 2023-02-14 ENCOUNTER — Ambulatory Visit (INDEPENDENT_AMBULATORY_CARE_PROVIDER_SITE_OTHER): Payer: Medicare Other

## 2023-02-14 DIAGNOSIS — I340 Nonrheumatic mitral (valve) insufficiency: Secondary | ICD-10-CM | POA: Insufficient documentation

## 2023-02-14 DIAGNOSIS — I10 Essential (primary) hypertension: Secondary | ICD-10-CM | POA: Diagnosis present

## 2023-02-14 DIAGNOSIS — R0609 Other forms of dyspnea: Secondary | ICD-10-CM | POA: Diagnosis present

## 2023-02-14 LAB — ECHOCARDIOGRAM COMPLETE
AR max vel: 2.38 cm2
AV Area VTI: 2.52 cm2
AV Area mean vel: 2.35 cm2
AV Mean grad: 5 mmHg
AV Peak grad: 12.3 mmHg
Ao pk vel: 1.75 m/s
Area-P 1/2: 2.54 cm2
S' Lateral: 3.45 cm

## 2023-02-14 MED ORDER — NITROGLYCERIN 0.4 MG SL SUBL
0.8000 mg | SUBLINGUAL_TABLET | Freq: Once | SUBLINGUAL | Status: AC
  Administered 2023-02-14: 0.8 mg via SUBLINGUAL

## 2023-02-14 MED ORDER — IOHEXOL 350 MG/ML SOLN
115.0000 mL | Freq: Once | INTRAVENOUS | Status: AC | PRN
  Administered 2023-02-14: 115 mL via INTRAVENOUS

## 2023-02-14 MED ORDER — NITROGLYCERIN 0.4 MG SL SUBL
SUBLINGUAL_TABLET | SUBLINGUAL | Status: AC
  Filled 2023-02-14: qty 2

## 2023-02-18 ENCOUNTER — Encounter (HOSPITAL_BASED_OUTPATIENT_CLINIC_OR_DEPARTMENT_OTHER): Payer: Self-pay | Admitting: Pulmonary Disease

## 2023-02-28 ENCOUNTER — Encounter: Payer: Self-pay | Admitting: Internal Medicine

## 2023-02-28 ENCOUNTER — Encounter: Payer: Self-pay | Admitting: Pulmonary Disease

## 2023-03-03 ENCOUNTER — Telehealth (HOSPITAL_BASED_OUTPATIENT_CLINIC_OR_DEPARTMENT_OTHER): Payer: Self-pay | Admitting: Cardiovascular Disease

## 2023-03-03 ENCOUNTER — Ambulatory Visit (HOSPITAL_COMMUNITY)
Admission: RE | Admit: 2023-03-03 | Discharge: 2023-03-03 | Disposition: A | Discharge status: Home / Self Care | Payer: Medicare Other | Source: Ambulatory Visit | Attending: Nurse Practitioner | Admitting: Nurse Practitioner

## 2023-03-03 DIAGNOSIS — E041 Nontoxic single thyroid nodule: Secondary | ICD-10-CM | POA: Insufficient documentation

## 2023-03-03 NOTE — Telephone Encounter (Signed)
Patient is calling for results to her echo and CT scan.  M-W-F you can reach her at home, T-Th on her cell although she might not be able to pick up since she is at work.

## 2023-03-03 NOTE — Telephone Encounter (Signed)
Results not available at this time, results will be released to patient once available.

## 2023-03-11 ENCOUNTER — Telehealth (HOSPITAL_BASED_OUTPATIENT_CLINIC_OR_DEPARTMENT_OTHER): Payer: Self-pay | Admitting: *Deleted

## 2023-03-11 NOTE — Telephone Encounter (Signed)
Left message to call back  

## 2023-03-11 NOTE — Telephone Encounter (Signed)
-----   Message from [facility] sent at 03/09/2023  6:10 PM EDT ----- Echo shows that her heart squeezes well but does not relax completely.  This is a mild change and will not cause symptoms unless it worsens.  It will be important to keep her blood pressure under good control.  There is only trivial leaking of the mitral valve.  There are some cysts in the liver.  Recommend getting a liver ultrasound.

## 2023-03-17 ENCOUNTER — Encounter (HOSPITAL_BASED_OUTPATIENT_CLINIC_OR_DEPARTMENT_OTHER): Payer: Self-pay | Admitting: Cardiovascular Disease

## 2023-03-18 ENCOUNTER — Telehealth (HOSPITAL_BASED_OUTPATIENT_CLINIC_OR_DEPARTMENT_OTHER): Payer: Self-pay

## 2023-03-18 DIAGNOSIS — K7689 Other specified diseases of liver: Secondary | ICD-10-CM

## 2023-03-18 NOTE — Telephone Encounter (Addendum)
2nd call attempt, no answer, Left message for patient to call back    ----- Message from [facility] sent at 03/09/2023  6:10 PM EDT ----- Echo shows that her heart squeezes well but does not relax completely.  This is a mild change and will not cause symptoms unless it worsens.  It will be important to keep her blood pressure under good control.  There is only trivial leaking of the mitral valve.  There are some cysts in the liver.  Recommend getting a liver ultrasound.

## 2023-03-19 NOTE — Addendum Note (Signed)
Addended by: Marlene Lard on: 03/19/2023 11:07 AM   Modules accepted: Orders

## 2023-03-19 NOTE — Telephone Encounter (Signed)
3rd call attempt, Results called to patient who verbalizes understanding! Liver ultrasound ordered.

## 2023-03-19 NOTE — Telephone Encounter (Signed)
Handled in separate encounter, removing from basket at this time.

## 2023-03-21 ENCOUNTER — Ambulatory Visit: Payer: Medicare Other

## 2023-03-21 MED ORDER — BENRALIZUMAB 30 MG/ML ~~LOC~~ SOSY: 30.0000 mg | PREFILLED_SYRINGE | Freq: Once | SUBCUTANEOUS | Status: DC

## 2023-03-24 ENCOUNTER — Telehealth: Payer: Self-pay | Admitting: "Endocrinology

## 2023-03-24 ENCOUNTER — Encounter: Payer: Self-pay | Admitting: Internal Medicine

## 2023-03-24 ENCOUNTER — Other Ambulatory Visit: Payer: Self-pay | Admitting: *Deleted

## 2023-03-24 ENCOUNTER — Encounter: Payer: Self-pay | Admitting: Pulmonary Disease

## 2023-03-24 ENCOUNTER — Ambulatory Visit (INDEPENDENT_AMBULATORY_CARE_PROVIDER_SITE_OTHER): Payer: Medicare Other

## 2023-03-24 VITALS — BP 127/84 | HR 16 | Temp 97.6°F | Resp 16 | Ht 65.0 in | Wt 287.8 lb

## 2023-03-24 DIAGNOSIS — J4550 Severe persistent asthma, uncomplicated: Secondary | ICD-10-CM | POA: Diagnosis not present

## 2023-03-24 DIAGNOSIS — E041 Nontoxic single thyroid nodule: Secondary | ICD-10-CM

## 2023-03-24 MED ORDER — BENRALIZUMAB 30 MG/ML ~~LOC~~ SOSY
30.0000 mg | PREFILLED_SYRINGE | Freq: Once | SUBCUTANEOUS | Status: AC
  Administered 2023-03-24: 30 mg via SUBCUTANEOUS
  Filled 2023-03-24: qty 1

## 2023-03-24 NOTE — Telephone Encounter (Signed)
Orders have been reordered for the patient.

## 2023-03-24 NOTE — Telephone Encounter (Signed)
Can you place new lab orders? Pt has an appt 10/7. thanks

## 2023-03-24 NOTE — Progress Notes (Signed)
Diagnosis: Acute Anemia  Provider:  Chilton Greathouse MD  Procedure: Injection  Fasenra (Benralizumab), Dose: 30 mg, Site: subcutaneous, Number of injections: 1  Post Care:     Discharge: Condition: Good, Destination: Home . AVS Provided  Performed by:  Garnette Czech, RN

## 2023-03-28 ENCOUNTER — Ambulatory Visit (HOSPITAL_BASED_OUTPATIENT_CLINIC_OR_DEPARTMENT_OTHER)
Admission: RE | Admit: 2023-03-28 | Discharge: 2023-03-28 | Disposition: A | Discharge status: Home / Self Care | Payer: Medicare Other | Source: Ambulatory Visit | Attending: Cardiovascular Disease

## 2023-03-28 DIAGNOSIS — K7689 Other specified diseases of liver: Secondary | ICD-10-CM | POA: Diagnosis present

## 2023-03-31 ENCOUNTER — Ambulatory Visit (INDEPENDENT_AMBULATORY_CARE_PROVIDER_SITE_OTHER): Payer: Medicare Other | Admitting: Nurse Practitioner

## 2023-03-31 ENCOUNTER — Encounter: Payer: Self-pay | Admitting: Nurse Practitioner

## 2023-03-31 VITALS — BP 131/81 | HR 71 | Ht 65.0 in | Wt 290.8 lb

## 2023-03-31 DIAGNOSIS — E041 Nontoxic single thyroid nodule: Secondary | ICD-10-CM

## 2023-03-31 NOTE — Progress Notes (Signed)
Endocrinology Follow Up Note 03/31/23     ---------------------------------------------------------------------------------------------------------------------- Subjective    Past Medical History:  Diagnosis Date   Allergy    Anemia    takes Iron daily   Anxiety    Arthritis    Asthma    Symbicort daily and Albuterol daily as needed   B12 deficiency    Back pain    Bilateral swelling of feet    Bruises easily    d/t being on Plavix    Chest pain    Complication of anesthesia    long time to wake up - referred to pulmonary after carpel tunnel procedure - for asthma and undiagnosed sleep apnea   Constipation    Depression    GERD (gastroesophageal reflux disease)    takes Protonix daily   Headache    Heart murmur    [facility] [place] [facility]   Heart murmur    History of blood transfusion    no abnormal reaction noted   History of colon polyps    beningn   History of migraine    couple of wks ago was the last one   Hyperlipidemia    takes Atorvastatin daily   Hypertension    takes Benicar and Diltiazem daily   Hypothyroidism    takes Synthroid daily   Itching    takes Atarax daily as needed   Joint pain    Joint pain    Joint swelling    Lactose intolerance    Leg swelling    Liver cyst    Mini stroke    Mitral regurgitation 01/27/2023   Multiple allergies    takes Claritin daily as needed as well as using Flonase if needed   Muscle spasm    takes Baclofen if needed   Osteoarthritis    Peripheral edema    takes Torsemide daily   Pneumonia    hx of   Prediabetes    Primary localized osteoarthritis of left knee 06/21/2014   Primary localized osteoarthritis of right knee 03/21/2015   Restrictive lung disease    Sleep apnea    cpap   Sleep apnea    Swallowing difficulty    TIA (transient ischemic attack)    takes Plavix daily;notices right side is slightly weaker than left   Vertigo    takes Meclizine daily as needed   Vitamin D deficiency      Past Surgical History:  Procedure Laterality Date   ABDOMINAL HYSTERECTOMY     BACK SURGERY     BREAST SURGERY     cyst removal   CARDIAC CATHETERIZATION     carpel tunnel Right    COLONOSCOPY     ESOPHAGOGASTRODUODENOSCOPY     KNEE ARTHROSCOPY     TONSILLECTOMY     TOTAL KNEE ARTHROPLASTY Left 06/21/2014   Procedure: LEFT TOTAL KNEE ARTHROPLASTY;  Surgeon: Eulas Post, MD;  Location: [facility] OR;  Service: Orthopedics;  Laterality: Left;   TOTAL KNEE ARTHROPLASTY Right 03/21/2015   Procedure: TOTAL KNEE ARTHROPLASTY STEROID INECTION BOTH KNEES;  Surgeon: Teryl Lucy, MD;  Location: [facility] OR;  Service: Orthopedics;  Laterality: Right;   trigger thumb      Social History   Socioeconomic History   Marital status: Married    Spouse name: Not on file   Number of children: Not on file   Years of education: Not on file   Highest education level: Not on file  Occupational History   Occupation: retired  Tobacco Use  Smoking status: Never    Passive exposure: Yes   Smokeless tobacco: Never   Tobacco comments:    Father smoked briefly.   Vaping Use   Vaping status: Never Used  Substance and Sexual Activity   Alcohol use: Yes    Comment: social   Drug use: No   Sexual activity: Not on file  Other Topics Concern   Not on file  Social History Narrative   [facility] (08/30/16):   Originally from [place]. 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 No pets currently. No bird, mold, or hot tub exposure. Does have indoor plants. No draperies. Does have carpet in the bedroom. No down bedding that she is aware of.    Social Determinants of Health   Financial Resource Strain: Not on file  Food Insecurity: Not on file  Transportation Needs: Not on file  Physical Activity: Not on file  Stress: Not on file  Social Connections: Not on file  Intimate Partner Violence: Not on file    Current  Outpatient Medications on File Prior to Visit  Medication Sig Dispense Refill   albuterol (VENTOLIN HFA) 108 (90 Base) MCG/ACT inhaler TAKE 2 PUFFS BY MOUTH EVERY 6 HOURS AS NEEDED FOR WHEEZE OR SHORTNESS OF BREATH 18 each 3   atorvastatin (LIPITOR) 10 MG tablet Take 10 mg by mouth daily.     budesonide (PULMICORT) 0.5 MG/2ML nebulizer solution USE 1 VIAL  IN  NEBULIZER TWICE  DAILY - At 10 AM And 5 PM - Rinse Mouth After Treatment 60 mL 11   clopidogrel (PLAVIX) 75 MG tablet Take 1 tablet by mouth daily.     diltiazem (CARDIZEM CD) 240 MG 24 hr capsule Take 240 mg by mouth daily.     EPINEPHrine 0.3 mg/0.3 mL IJ SOAJ injection Inject 0.3 mg into the muscle once.     fluticasone (FLONASE) 50 MCG/ACT nasal spray Place 1 spray into both nostrils 2 (two) times daily as needed for allergies or rhinitis.     formoterol (PERFOROMIST) 20 MCG/2ML nebulizer solution USE 1 VIAL  IN  NEBULIZER TWICE  DAILY - - Morning And Evening 60 mL 11   guaiFENesin (MUCINEX) 600 MG 12 hr tablet Take 600 mg by mouth 2 (two) times daily as needed for to loosen phlegm.     hydrALAZINE (APRESOLINE) 25 MG tablet TAKE 1 TABLET BY MOUTH EVERY DAY IN THE MORNING AND AT BEDTIME     ipratropium-albuterol (DUONEB) 0.5-2.5 (3) MG/3ML SOLN Inhale into the lungs.     Iron-Vitamin C (VITRON-C) 65-125 MG TABS Take by mouth.     levothyroxine (SYNTHROID, LEVOTHROID) 50 MCG tablet Take 50 mcg by mouth daily before breakfast.     loratadine (CLARITIN) 10 MG tablet Take 10 mg by mouth daily as needed for allergies.     metoprolol succinate (TOPROL-XL) 25 MG 24 hr tablet Take 1 tablet (25 mg total) by mouth in the morning and at bedtime. 180 tablet 1   montelukast (SINGULAIR) 10 MG tablet Take 1 tablet (10 mg total) by mouth at bedtime. 90 tablet 3   Multiple Vitamins-Minerals (MULTIVITAMIN WITH MINERALS) tablet Take 1 tablet by mouth daily.     olmesartan-hydrochlorothiazide (BENICAR HCT) 40-25 MG per tablet Take 1 tablet by mouth daily.      Omega-3 Fatty Acids (FISH OIL PO) Take 1 capsule by mouth 2 (two) times daily.  pantoprazole (PROTONIX) 40 MG tablet Take 40 mg by mouth daily.     potassium chloride (MICRO-K) 10 MEQ CR capsule Take 30 mEq by mouth daily.      pyridOXINE (B-6) 50 MG tablet Take 50 mg by mouth daily.     Semaglutide-Weight Management 1 MG/0.5ML SOAJ Inject 1 mg into the skin once a week for 28 days. 2 mL 0   [START ON 04/07/2023] Semaglutide-Weight Management 1.7 MG/0.75ML SOAJ Inject 1.7 mg into the skin once a week for 28 days. 3 mL 0   [START ON 05/06/2023] Semaglutide-Weight Management 2.4 MG/0.75ML SOAJ Inject 2.4 mg into the skin once a week for 28 days. 3 mL 0   Spacer/Aero-Holding Chambers (AEROCHAMBER MV) inhaler Use as instructed 1 each 0   torsemide (DEMADEX) 20 MG tablet Take 1 tablet (20 mg total) by mouth daily. 90 tablet 3   TURMERIC PO Take 1 capsule by mouth daily.     Vitamin D, Ergocalciferol, (DRISDOL) 1.25 MG (50000 UNIT) CAPS capsule Take 1 capsule (50,000 Units total) by mouth every 7 (seven) days. 4 capsule 0   No current facility-administered medications on file prior to visit.      HPI   Tamara Daugherty is a 72 y.o.-year-old female, referred by her PCP, Dr.Paul Maryellen Pile, for evaluation for multinodular goiter.  She had carotid doppler and a thyroid nodule was incidentally found, therefore her PCP ordered dedicated thyroid ultrasound to evaluate.  Thyroid U/S: 01/10/22 CLINICAL DATA:  Thyroid nodule   EXAM: THYROID ULTRASOUND   TECHNIQUE: Ultrasound examination of the thyroid gland and adjacent soft tissues was performed.   COMPARISON:  None available   FINDINGS: Parenchymal Echotexture: Mildly heterogenous   Isthmus: 0.4 cm   Right lobe: 4.7 x 1.4 x 2.5 cm   Left lobe: 4.6 x 1.4 x 1.7 cm   _________________________________________________________   Estimated total number of nodules >/= 1 cm: 2   Number of spongiform nodules >/=  2 cm not described below (TR1):  0   Number of mixed cystic and solid nodules >/= 1.5 cm not described below (TR2): 0   _________________________________________________________   Nodule # 1:   Location: Right; superior   Maximum size: 2.2 cm; Other 2 dimensions: 1.0 x 1.9 cm   Composition: solid/almost completely solid (2)   Echogenicity: hypoechoic (2)   Shape: not taller-than-wide (0)   Margins: smooth (0)   Echogenic foci: none (0)   ACR TI-RADS total points: 4.   ACR TI-RADS risk category: TR4 (4-6 points).   ACR TI-RADS recommendations:   **Given size (>/= 1.5 cm) and appearance, fine needle aspiration of this moderately suspicious nodule should be considered based on TI-RADS criteria.   _________________________________________________________   Nodule 2: 1.5 cm cystic right mid thyroid nodule does not meet criteria for imaging surveillance or FNA.   IMPRESSION: Nodule 1 (TI-RADS 4), measuring 2.2 cm, located in the superior right thyroid lobe, meets criteria for FNA.   The above is in keeping with the ACR TI-RADS recommendations - J Am Coll Radiol 2017;14:587-595.     Electronically Signed   By: Acquanetta Belling M.D.   On: 01/10/2022 14:49    I reviewed pt's thyroid tests: Lab Results  Component Value Date   TSH 1.06 09/07/2021   TSH 1.610 12/01/2019     She does complain of intermittent difficulty swallowing (especially pills) and sometimes gets choked.  She denies any difficulty breathing while laying down, hoarseness, nodules in neck.  She does have  family history of hypothyroidism in a cousin and personal history of hypothyroidism (on replacement therapy). No FH of thyroid cancer. No h/o radiation tx to head or neck.  No seaweed or kelp. No recent contrast studies. No steroid use. No herbal supplements. No Biotin supplements or Hair, Skin and Nails vitamins.  Pt also has a history of anemia, prediabetes, heart murmur, GERD, TIA (on Plavix), hypothyroidism (on replacement), and  OSA.  Review of systems  Constitutional: + Minimally fluctuating body weight,  current Body mass index is 48.39 kg/m. , no fatigue, no subjective hyperthermia, no subjective hypothermia Eyes: no blurry vision, no xerophthalmia ENT: no sore throat, no nodules palpated in throat, + intermittent dysphagia/odynophagia, no hoarseness Cardiovascular: no chest pain, no shortness of breath, no palpitations, no leg swelling Respiratory: no cough, no shortness of breath Gastrointestinal: no nausea/vomiting/diarrhea Musculoskeletal: no muscle/joint aches Skin: no rashes, no hyperemia Neurological: no tremors, no numbness, no tingling, no dizziness Psychiatric: no depression, no anxiety  ---------------------------------------------------------------------------------------------------------------------- Objective    BP 131/81 (BP Location: Left Arm, Patient Position: Sitting, Cuff Size: Large)   Pulse 71   Ht 5\' 5"  (1.651 m)   Wt 290 lb 12.8 oz (131.9 kg)   BMI 48.39 kg/m    BP Readings from Last 3 Encounters:  03/31/23 131/81  03/24/23 127/84  02/14/23 115/64    Wt Readings from Last 3 Encounters:  03/31/23 290 lb 12.8 oz (131.9 kg)  03/24/23 287 lb 12.8 oz (130.5 kg)  01/27/23 297 lb 1.6 oz (134.8 kg)     Physical Exam- Limited  Constitutional:  Body mass index is 48.39 kg/m. , not in acute distress, normal state of mind Eyes:  EOMI, no exophthalmos Musculoskeletal: no gross deformities, strength intact in all four extremities, no gross restriction of joint movements Skin:  no rashes, no hyperemia Neurological: no tremor with outstretched hands          Thyroid Ultrasound from 03/03/23 CLINICAL DATA:  72 year old female with multinodular thyroid   EXAM: THYROID ULTRASOUND   TECHNIQUE: Ultrasound examination of the thyroid gland and adjacent soft tissues was performed.   COMPARISON:  01/10/2022   Biopsy 02/14/2022 of right superior thyroid nodule    FINDINGS: Parenchymal Echotexture: Mildly heterogenous   Isthmus: 0.5 cm   Right lobe: 4.4 cm x 1.6 cm x 2.4 cm   Left lobe: 4.9 cm x 1.5 cm x 1.5 cm   _________________________________________________________   Estimated total number of nodules >/= 1 cm: 2   Number of spongiform nodules >/=  2 cm not described below (TR1): 0   Number of mixed cystic and solid nodules >/= 1.5 cm not described below (TR2): 0   _________________________________________________________   Nodule labeled 1, superior right thyroid, 2.5 cm. Nodule has undergone prior biopsy. Assuming benign result no further specific follow-up would be indicated.   Nodule labeled 2 in the mid right thyroid, unchanged 1.6 cm. Nodule is TR 1/cystic and does not meet criteria for surveillance.   No adenopathy   Recommendations follow those established by the new ACR TI-RADS criteria (J Am Coll Radiol 2017;14:587-595).   IMPRESSION: Unchanged appearance of thyroid, with previously biopsied nodule as above.     Electronically Signed   By: Gilmer Mor D.O.   On: 03/03/2023 16:34   ----------------------------------------------------------------------------------------------------------------------  ASSESSMENT / PLAN:  1. Thyroid Nodule 2. Hypothyroidism  - She had FNA of suspicious thyroid nodule which was sent to [facility] and found to be benign.    Her previsit thyroid function  studies are consistent with appropriate hormone replacement.  She is advised to continue Levothyroxine 50 mcg po daily before breakfast, this is managed by her PCP.    Her repeat thyroid ultrasound shows similar appearance, no new nodules, no significant changes.  She does not need any further surveillance imaging at this time.    -Given her stability, her care can be transferred back to her PCP at this time.   FOLLOW UP PLAN: Return may go back to PCP for annual monitoring.      I spent  17  minutes in the care of the  patient today including review of labs from Thyroid Function, CMP, and other relevant labs ; imaging/biopsy records (current and previous including abstractions from other facilities); face-to-face time discussing  her lab results and symptoms, medications doses, her options of short and long term treatment based on the latest standards of care / guidelines;   and documenting the encounter.  [name]  participated in the discussions, expressed understanding, and voiced agreement with the above plans.  All questions were answered to her satisfaction. she is encouraged to contact clinic should she have any questions or concerns prior to her return visit.    Ronny Bacon, [facility] [address] [place], [place] [address] Phone: [phone] Fax: [phone]

## 2023-04-04 ENCOUNTER — Ambulatory Visit (HOSPITAL_BASED_OUTPATIENT_CLINIC_OR_DEPARTMENT_OTHER): Payer: Medicare Other | Admitting: Family

## 2023-04-04 ENCOUNTER — Encounter (HOSPITAL_BASED_OUTPATIENT_CLINIC_OR_DEPARTMENT_OTHER): Payer: Self-pay

## 2023-05-16 ENCOUNTER — Ambulatory Visit (HOSPITAL_BASED_OUTPATIENT_CLINIC_OR_DEPARTMENT_OTHER): Payer: Medicare Other | Admitting: Pulmonary Disease

## 2023-05-16 ENCOUNTER — Encounter (HOSPITAL_BASED_OUTPATIENT_CLINIC_OR_DEPARTMENT_OTHER): Payer: Self-pay | Admitting: Pulmonary Disease

## 2023-05-16 VITALS — BP 130/82 | HR 61 | Ht 65.0 in | Wt 292.0 lb

## 2023-05-16 DIAGNOSIS — J4550 Severe persistent asthma, uncomplicated: Secondary | ICD-10-CM | POA: Diagnosis not present

## 2023-05-16 DIAGNOSIS — E66813 Obesity, class 3: Secondary | ICD-10-CM

## 2023-05-16 DIAGNOSIS — G4733 Obstructive sleep apnea (adult) (pediatric): Secondary | ICD-10-CM | POA: Diagnosis not present

## 2023-05-16 DIAGNOSIS — Z6841 Body Mass Index (BMI) 40.0 and over, adult: Secondary | ICD-10-CM | POA: Diagnosis not present

## 2023-05-16 MED ORDER — FLUTICASONE-SALMETEROL 250-50 MCG/ACT IN AEPB: 1.0000 | INHALATION_SPRAY | Freq: Two times a day (BID) | RESPIRATORY_TRACT | 3 refills | Status: DC

## 2023-05-16 NOTE — Assessment & Plan Note (Signed)
Ozempic worked well for her but this was too expensive. We discussed alternatives including phentermine and Rybelsus -she will discuss with PCP

## 2023-05-16 NOTE — Progress Notes (Signed)
   Subjective:    Patient ID: Tamara Daugherty, female    DOB: Jun 04, 1951, 72 y.o.   MRN: [id number]  HPI  72 y.o. never smoker with severe persistent asthma and obstructive sleep apnea.  -started on fasenra in May 2020   She presents to establish care, she was previously following with Dr. Craige Cotta 59-month follow-up visit. She has done well in the interim.  She feels Harrington Challenger is working well for her.  She was maintained on a regimen of Symbicort and when this was too expensive she would take a combination of budesonide/formoterol although she prefers the MDI.  For the past 3 weeks she has stopped taking Symbicort.  She feels Harrington Challenger is giving her enough protection. Perforomist would cause increased heart rate especially when she would take it at night. She would like to discuss generic alternatives. She remains on Singulair and Claritin daily and uses Flonase on an as-needed basis. She is compliant with CPAP, maintained on 7 cm with nasal pillows, DME is [facility].  Denies any problems with mask or pressure   Significant tests/ events reviewed  Pulmonary testing:  RAST 08/30/16 >> dust mights, IgE 121 PFT 09/27/16 >> FEV1 1.56 (71%), FEV1% 81, TLC 3.46 (64%), DLCO 69% PFT 01/09/17 >> FEV1 1.67 (76%), FEV1% 75, DLCO 65% PFT 04/23/18 >> FEV1 1.61 (76%), FEV1% 77, TLC 4.63 (87%), DLCO 69%   Chest Imaging:  HRCT chest 01/16/17 >> mild tracheomalacia, mild CM, PA 4.1 cm   Sleep Tests:  PSG 12/16/13 ([facility]) >> moderate OSA CPAP 04/01/22 to 04/30/22 >> used on 30 of 30 nights with average 7 hrs 58 min.  Average AHI 4 with CPAP 7 cm H2O Cardiac Tests:  Echo 12/26/21 >> EF 55%, grade 1 DD, mod LA dilation, mod LA dilation, mod MR   Review of Systems neg for any significant sore throat, dysphagia, itching, sneezing, nasal congestion or excess/ purulent secretions, fever, chills, sweats, unintended wt loss, pleuritic or exertional cp, hempoptysis, orthopnea pnd or change in chronic leg swelling. Also  denies presyncope, palpitations, heartburn, abdominal pain, nausea, vomiting, diarrhea or change in bowel or urinary habits, dysuria,hematuria, rash, arthralgias, visual complaints, headache, numbness weakness or ataxia.     Objective:   Physical Exam  Gen. Pleasant, obese, in no distress ENT - no lesions, no post nasal drip Neck: No JVD, no thyromegaly, no carotid bruits Lungs: no use of accessory muscles, no dullness to percussion, decreased without rales or rhonchi  Cardiovascular: Rhythm regular, heart sounds  normal, no murmurs or gallops, no peripheral edema Musculoskeletal: No deformities, no cyanosis or clubbing , no tremors       Assessment & Plan:

## 2023-05-16 NOTE — Assessment & Plan Note (Signed)
We discussed generic alternatives to Symbicort which would be cheaper on her insurance, she preferred Advair and we will prescribe. I emphasized use of steroid/LABA combination in combination with Fasenra and not to be logrolled into a false sense of security just because she is getting injections. Continue Singulair and Claritin Use albuterol for rescue. We discussed action plan for asthma

## 2023-05-16 NOTE — Assessment & Plan Note (Signed)
She is compliant with CPAP and denies any problems with mask or pressure. She is maintained on 7 cm.  No download available today to objectively confirm compliance but CPAP is only helped improve her daytime somnolence and fatigue  Weight loss encouraged, compliance with goal of at least 4-6 hrs every night is the expectation. Advised against medications with sedative side effects Cautioned against driving when sleepy - understanding that sleepiness will vary on a day to day basis

## 2023-05-16 NOTE — Patient Instructions (Signed)
Generic alternatives to Symbicort include -Breyna $47 -Advair or Wixela -these are powders -$0  Continue to use CPAP every night

## 2023-05-21 ENCOUNTER — Ambulatory Visit: Payer: Medicare Other

## 2023-05-21 VITALS — BP 137/82 | HR 61 | Temp 97.6°F | Resp 20 | Ht 65.0 in | Wt 290.8 lb

## 2023-05-21 DIAGNOSIS — J4550 Severe persistent asthma, uncomplicated: Secondary | ICD-10-CM

## 2023-05-21 MED ORDER — BENRALIZUMAB 30 MG/ML ~~LOC~~ SOSY
30.0000 mg | PREFILLED_SYRINGE | Freq: Once | SUBCUTANEOUS | Status: AC
Start: 2023-05-21 — End: 2023-05-21
  Administered 2023-05-21: 30 mg via SUBCUTANEOUS
  Filled 2023-05-21: qty 1

## 2023-05-21 NOTE — Progress Notes (Signed)
Diagnosis: Asthma  Provider:  Chilton Greathouse MD  Procedure: Injection  Fasenra (Benralizumab), Dose: 30 mg, Site: subcutaneous, Number of injections: 1  Post Care: Patient declined observation  Discharge: Condition: Good, Destination: Home . AVS Declined  Performed by:  Adriana Mccallum, RN

## 2023-05-30 ENCOUNTER — Other Ambulatory Visit: Payer: Self-pay | Admitting: Cardiovascular Disease

## 2023-06-03 ENCOUNTER — Telehealth (HOSPITAL_BASED_OUTPATIENT_CLINIC_OR_DEPARTMENT_OTHER): Payer: Self-pay | Admitting: Pulmonary Disease

## 2023-06-03 DIAGNOSIS — G4733 Obstructive sleep apnea (adult) (pediatric): Secondary | ICD-10-CM

## 2023-06-03 NOTE — Telephone Encounter (Signed)
I reviewed her CPAP download report. She has residual AHI of 18/hour on CPAP 8 cm.  She may need slight increase in pressure to adjust. Would suggest change to auto settings 8 to 12 cm

## 2023-06-03 NOTE — Telephone Encounter (Signed)
Patient aware and order has been placed.

## 2023-07-08 ENCOUNTER — Telehealth: Payer: Self-pay

## 2023-07-08 NOTE — Telephone Encounter (Signed)
 Auth Submission: NO AUTH NEEDED Payer: medicare a/b and supp Medication & CPT/J Code(s) submitted: Fasenra  (Benralizumab ) G9482 Route of submission (phone, fax, portal):  Phone # Fax # Auth type: Buy/Bill Units/visits requested: [id number] Reference number:  Approval from: 08/06/22 to 07/24/24

## 2023-07-16 ENCOUNTER — Ambulatory Visit: Payer: Medicare Other

## 2023-07-21 ENCOUNTER — Ambulatory Visit: Payer: Medicare Other

## 2023-07-21 VITALS — BP 123/80 | HR 64 | Temp 98.3°F | Resp 16 | Ht 65.0 in | Wt 295.2 lb

## 2023-07-21 DIAGNOSIS — J4550 Severe persistent asthma, uncomplicated: Secondary | ICD-10-CM

## 2023-07-21 MED ORDER — BENRALIZUMAB 30 MG/ML ~~LOC~~ SOSY
30.0000 mg | PREFILLED_SYRINGE | Freq: Once | SUBCUTANEOUS | Status: AC
  Administered 2023-07-21: 30 mg via SUBCUTANEOUS
  Filled 2023-07-21: qty 1

## 2023-07-21 NOTE — Progress Notes (Signed)
Diagnosis: Asthma  Provider:  Chilton Greathouse MD  Procedure: Injection  Fasenra (Benralizumab), Dose: 30 mg, Site: subcutaneous, Number of injections: 1  Injection Site(s): Right arm  Post Care: N/A  Discharge: Condition: Good, Destination: Home . AVS Declined  Performed by:  Nat Math, RN

## 2023-09-15 ENCOUNTER — Ambulatory Visit (INDEPENDENT_AMBULATORY_CARE_PROVIDER_SITE_OTHER): Payer: Medicare Other

## 2023-09-15 VITALS — BP 158/75 | HR 92 | Temp 98.0°F | Resp 16 | Ht 65.0 in | Wt 295.6 lb

## 2023-09-15 DIAGNOSIS — J4550 Severe persistent asthma, uncomplicated: Secondary | ICD-10-CM

## 2023-09-15 MED ORDER — BENRALIZUMAB 30 MG/ML ~~LOC~~ SOSY
30.0000 mg | PREFILLED_SYRINGE | Freq: Once | SUBCUTANEOUS | Status: AC
  Administered 2023-09-15: 30 mg via SUBCUTANEOUS
  Filled 2023-09-15: qty 1

## 2023-09-15 NOTE — Progress Notes (Signed)
 Diagnosis: Asthma  Provider:  Chilton Greathouse MD  Procedure: Injection  Fasenra (Benralizumab), Dose: 30 mg, Site: subcutaneous, Number of injections: 1  Injection Site(s): Left arm  Post Care: Patient declined observation  Discharge: Condition: Good, Destination: Home . AVS Declined  Performed by:  Wyvonne Lenz, RN

## 2023-09-23 ENCOUNTER — Telehealth: Payer: Self-pay | Admitting: Pulmonary Disease

## 2023-09-23 NOTE — Telephone Encounter (Signed)
 Rec'd fax from [facility] for "medical Equip." PT has not been seen in a year. Faxing back to [facility] @ [phone] to advise we can not complete order.

## 2023-10-07 NOTE — Telephone Encounter (Signed)
 NFN

## 2023-11-10 ENCOUNTER — Ambulatory Visit

## 2023-11-10 VITALS — BP 131/73 | HR 62 | Temp 97.6°F | Resp 16 | Ht 65.0 in | Wt 293.0 lb

## 2023-11-10 DIAGNOSIS — J4550 Severe persistent asthma, uncomplicated: Secondary | ICD-10-CM

## 2023-11-10 MED ORDER — BENRALIZUMAB 30 MG/ML ~~LOC~~ SOSY
30.0000 mg | PREFILLED_SYRINGE | Freq: Once | SUBCUTANEOUS | Status: AC
  Administered 2023-11-10: 30 mg via SUBCUTANEOUS

## 2023-11-10 NOTE — Progress Notes (Signed)
 Diagnosis: Asthma  Provider:  Mannam, Praveen MD  Procedure: Injection  Fasenra  (Benralizumab ), Dose: 30 mg, Site: subcutaneous, Number of injections: 1  Injection Site(s): Right arm  Post Care: Patient declined observation  Discharge: Condition: Good, Destination: Home . AVS Declined  Performed by:  Rachelle Bue, RN

## 2023-11-24 ENCOUNTER — Other Ambulatory Visit: Payer: Self-pay | Admitting: Cardiovascular Disease

## 2023-11-24 DIAGNOSIS — I700 Atherosclerosis of aorta: Secondary | ICD-10-CM

## 2023-11-24 DIAGNOSIS — I10 Essential (primary) hypertension: Secondary | ICD-10-CM

## 2023-11-24 DIAGNOSIS — R0609 Other forms of dyspnea: Secondary | ICD-10-CM

## 2023-11-24 DIAGNOSIS — I340 Nonrheumatic mitral (valve) insufficiency: Secondary | ICD-10-CM

## 2024-01-05 ENCOUNTER — Ambulatory Visit

## 2024-01-05 VITALS — BP 149/69 | HR 63 | Temp 98.6°F | Resp 14 | Ht 65.0 in | Wt 295.0 lb

## 2024-01-05 DIAGNOSIS — J4550 Severe persistent asthma, uncomplicated: Secondary | ICD-10-CM

## 2024-01-05 MED ORDER — BENRALIZUMAB 30 MG/ML ~~LOC~~ SOSY
30.0000 mg | PREFILLED_SYRINGE | Freq: Once | SUBCUTANEOUS | Status: AC
  Administered 2024-01-05: 30 mg via SUBCUTANEOUS
  Filled 2024-01-05: qty 1

## 2024-01-05 NOTE — Progress Notes (Signed)
 Diagnosis: Asthma  Provider:  Chilton Greathouse MD  Procedure: Injection  Fasenra (Benralizumab), Dose: 30 mg, Site: subcutaneous, Number of injections: 1  Injection Site(s): Right arm  Post Care: Patient declined observation  Discharge: Condition: Good, Destination: Home . AVS Declined  Performed by:  Wyvonne Lenz, RN

## 2024-01-27 ENCOUNTER — Telehealth: Payer: Self-pay | Admitting: Cardiovascular Disease

## 2024-01-27 NOTE — Telephone Encounter (Signed)
  Per MyChart scheduling message:  Pt c/o of Chest Pain: STAT if active (IN THIS MOMENT) CP, including tightness, pressure, jaw pain, shoulder/upper arm/back pain, SOB, nausea, and vomiting.  1. Are you having CP right now (tightness, pressure, or discomfort)?   2. Are you experiencing any other symptoms (ex. SOB, nausea, vomiting, sweating)?   3. How long have you been experiencing CP?   4. Is your CP continuous or coming and going?   5. Have you taken Nitroglycerin ?   6. If CP returns before callback, please consider calling 911. ?   1. No 2. Shortness of breath  3. No chest pressure  4.n/a 5.no 6.yes 7.a month 8.moving around

## 2024-01-27 NOTE — Telephone Encounter (Signed)
 Left message for patient to call back to discuss symptoms.

## 2024-01-29 NOTE — Telephone Encounter (Signed)
 Spoke with pt, for the last 3-4 weeks, she has noticed increased SOB. She reports SOB with any activity in the home and it gets worse in the heat. She can get swelling in her legs by the end of the day but there is none in the morning. The swelling will get better if she elevates her legs. She saw her lung doctor and he could not find a reason for SOB regarding her lungs so he asked her to contact and get a follow up appointment. She does take her torsemide  daily. Follow up scheduled

## 2024-02-02 ENCOUNTER — Encounter: Payer: Self-pay | Admitting: Internal Medicine

## 2024-02-02 ENCOUNTER — Encounter: Payer: Self-pay | Admitting: Pulmonary Disease

## 2024-02-03 ENCOUNTER — Other Ambulatory Visit (HOSPITAL_BASED_OUTPATIENT_CLINIC_OR_DEPARTMENT_OTHER): Payer: Self-pay

## 2024-02-03 ENCOUNTER — Ambulatory Visit (INDEPENDENT_AMBULATORY_CARE_PROVIDER_SITE_OTHER): Admitting: Family

## 2024-02-03 ENCOUNTER — Encounter: Payer: Self-pay | Admitting: Internal Medicine

## 2024-02-03 ENCOUNTER — Telehealth: Payer: Self-pay | Admitting: Pharmacy Technician

## 2024-02-03 ENCOUNTER — Other Ambulatory Visit (HOSPITAL_COMMUNITY): Payer: Self-pay

## 2024-02-03 ENCOUNTER — Encounter (HOSPITAL_BASED_OUTPATIENT_CLINIC_OR_DEPARTMENT_OTHER): Payer: Self-pay | Admitting: Family

## 2024-02-03 ENCOUNTER — Encounter: Payer: Self-pay | Admitting: Pulmonary Disease

## 2024-02-03 VITALS — BP 112/68 | HR 64 | Ht 65.0 in | Wt 297.8 lb

## 2024-02-03 DIAGNOSIS — I5189 Other ill-defined heart diseases: Secondary | ICD-10-CM

## 2024-02-03 DIAGNOSIS — I25118 Atherosclerotic heart disease of native coronary artery with other forms of angina pectoris: Secondary | ICD-10-CM

## 2024-02-03 DIAGNOSIS — I340 Nonrheumatic mitral (valve) insufficiency: Secondary | ICD-10-CM | POA: Diagnosis not present

## 2024-02-03 DIAGNOSIS — G4733 Obstructive sleep apnea (adult) (pediatric): Secondary | ICD-10-CM

## 2024-02-03 DIAGNOSIS — E785 Hyperlipidemia, unspecified: Secondary | ICD-10-CM

## 2024-02-03 DIAGNOSIS — R0609 Other forms of dyspnea: Secondary | ICD-10-CM

## 2024-02-03 DIAGNOSIS — I10 Essential (primary) hypertension: Secondary | ICD-10-CM | POA: Diagnosis not present

## 2024-02-03 MED ORDER — EMPAGLIFLOZIN 10 MG PO TABS: 10.0000 mg | ORAL_TABLET | Freq: Every day | ORAL | 0 refills | Status: DC

## 2024-02-03 MED ORDER — EMPAGLIFLOZIN 10 MG PO TABS: 10.0000 mg | ORAL_TABLET | Freq: Every day | ORAL | 3 refills | Status: DC

## 2024-02-03 NOTE — Telephone Encounter (Signed)
 Patient Advocate Encounter   The patient was approved for a [employer] grant that will help cover the cost of JArdiance  Total amount awarded, 7500.00.  Effective: 01/04/24 - 01/02/25   APW:[id number] ERW:[id number] Hmnle:[id number] PI:[id number]  [employer] ID: [id number]   Pharmacy provided with approval and processing information. Patient informed via mychart

## 2024-02-03 NOTE — Patient Instructions (Addendum)
 Medication Instructions:  Start Jardiance  10mg . Take this this tablet daily.  *If you need a refill on your cardiac medications before your next appointment, please call your pharmacy*   Lab Work: On the day of the Echo, you will have your labs drawn................... BMET If you have labs (blood work) drawn today and your tests are completely normal, you will receive your results only by: MyChart Message (if you have MyChart) OR A paper copy in the mail If you have any lab test that is abnormal or we need to change your treatment, we will call you to review the results.   Testing/Procedures: Your physician has requested that you have an echocardiogram. Echocardiography is a painless test that uses sound waves to create images of your heart. It provides your doctor with information about the size and shape of your heart and how well your heart's chambers and valves are working. This procedure takes approximately one hour. There are no restrictions for this procedure. Please do NOT wear cologne, perfume, aftershave, or lotions (deodorant is allowed). Please arrive 15 minutes prior to your appointment time.  Please note: We ask at that you not bring children with you during ultrasound (echo/ vascular) testing. Due to room size and safety concerns, children are not allowed in the ultrasound rooms during exams. Our front office staff cannot provide observation of children in our lobby area while testing is being conducted. An adult accompanying a patient to their appointment will only be allowed in the ultrasound room at the discretion of the ultrasound technician under special circumstances. We apologize for any inconvenience.    Follow-Up: At [facility], you and your health needs are our priority.  As part of our continuing mission to provide you with exceptional heart care, we have created designated Provider Care Teams.  These Care Teams include your primary Cardiologist  (physician) and Advanced Practice Providers (APPs -  Physician Assistants and Nurse Practitioners) who all work together to provide you with the care you need, when you need it.  We recommend signing up for the patient portal called MyChart.  Sign up information is provided on this After Visit Summary.  MyChart is used to connect with patients for Virtual Visits (Telemedicine).  Patients are able to view lab/test results, encounter notes, upcoming appointments, etc.  Non-urgent messages can be sent to your provider as well.   To learn more about what you can do with MyChart, go to [email or web address].    Your next appointment:   2-3 month(s)  Provider:   Reche Finder, NP OR Dr. Annabella Scarce   Other Instructions Thank you for choosing [facility]!  Patient assistance requested for Jardiance  through the RX assist team.

## 2024-02-03 NOTE — Telephone Encounter (Signed)
 Pharmacy Patient Advocate Encounter   Received notification from Physician's Office that prior authorization for Jardiance  10MG  is required/requested.   Insurance verification completed.   The patient is insured through [employer] .   Per test claim: The current 02/03/24 day co-pay is, $47.00- one month.  No PA needed at this time. This test claim was processed through [facility]- copay amounts may vary at other pharmacies due to pharmacy/plan contracts, or as the patient moves through the different stages of their insurance plan.

## 2024-02-03 NOTE — Progress Notes (Signed)
 Cardiology Office Note:  .   Date:  02/03/2024  ID:  Tamara Daugherty, Tamara Daugherty, Tamara Daugherty, MRN [id number] PCP: Graydon Mt, MD  [facility] Providers Cardiologist:  Annabella Scarce, MD    History of Present Illness: Tamara   BRITHANY Daugherty is a 73 y.o. female  with a hx of asthma, GERD, TIA, hypothyroidism, hypertension, sleep apnea on CPAP, obesity, IDA, mitral regurgitation.  Previously evaluated Dr. Charls in 2018 with echocardiogram normal LVEF, mild MR.  Cardiac catheterization greater than 20 years ago unremarkable.   Coronary CT 02/14/23 calcium  score of 1.22 placing her in 51st percentile fo rage/sex with RCA minima 1-24% calcified plaque proximally. Echo 02/14/23 LVFE 60-65%, moderate asymmetric LVH, gr1dd, RV moderately enlarged with normal function, moderate LAE, mild RAE, trivial MR.    Presents today for follow-up with her husband. She notes again that she is frustrated by obesity. Previously took Ozempic  but it became cost prohibitive. Per chart review, prior A1c have shown prediabetes but no diabetes and better controlled recently. No formal exercise routine. She recently had workup with pulmonary and primary care for exertional dyspnea without clear cause and was recommended to follow up with cardiology. Per pulmonary notes, obesity and deconditioning also felt to be contributory. Notes she is short of breath with minimal activity, feels okay at rest. She continues to wear her CPAP regularly. No PND. She had some swelling a couple weeks ago when it was very hot had to use Torsemide  which she attributes to the heat R>L. She notes some pain in her right hip and down her leg that has been ongoing for some time. Reports it moves sometimes her hip, thigh, and lower leg. Evaluated by orthopedics who found arthritis and possible sciatica. She reports some left upper chest and arm pain last night though on Sunday having more right right arm that occurs sometimes with her dyspnea but sometimes  independently. No significant exertional component. Reports cough in the morning that resolves by later in the day. She have some phlegm in the morning which she is able to cough. She does note some wheeze intermittently throughout the day. She reports episdoes of lightheadednes going on for awhile with sensation she needs to sit down. No spinning. She is eating two meals per day. She has been hydrating well with water and some Sprite to help with indigestion.   ROS: Please see the history of present illness.    All other systems reviewed and are negative.   Studies Reviewed: .       Cardiac Studies & Procedures   ______________________________________________________________________________________________     ECHOCARDIOGRAM  ECHOCARDIOGRAM COMPLETE 02/14/2023  Narrative ECHOCARDIOGRAM REPORT    Patient Name:   Tamara Daugherty Date of Exam: 02/14/2023 Medical Rec #:  [id number]     Height:       65.0 in Accession #:    [id number]    Weight:       297.1 lb Date of Birth:  [birth year] D[birth year]      BSA:          2.340 m Patient Age:    72 years      BP:           110/60 mmHg Patient Gender: F             HR:           64  bpm. Exam Location:  Outpatient  Procedure: 2D Echo, 3D Echo, Color Doppler and Cardiac Doppler  Indications:  R06.9 DOE; R60.0 Lower extremity edema  History:        Patient has prior history of Echocardiogram examinations, most recent 12/26/2021. TIA, Signs/Symptoms:Dyspnea and Edema; Risk Factors:Hypertension, Non-Smoker and Sleep Apnea. Patient denies chest pain. She does have DOE and leg edema.  Sonographer:    Annabella Cater RVT, RDCS (AE), RDMS Referring Phys: [phone] [facility]   Sonographer Comments: Patient is obese. Image acquisition challenging due to patient body habitus. IMPRESSIONS   1. Left ventricular ejection fraction, by estimation, is 60 to 65%. The left ventricle has normal function. Left ventricular endocardial border not optimally defined to  evaluate regional wall motion. There is moderate asymmetric left ventricular hypertrophy of the basal-septal segment. Left ventricular diastolic parameters are consistent with Grade I diastolic dysfunction (impaired relaxation). 2. Right ventricular systolic function is normal. The right ventricular size is moderately enlarged. 3. Left atrial size was moderately dilated. 4. Right atrial size was mildly dilated. 5. The mitral valve is grossly normal. Trivial mitral valve regurgitation. No evidence of mitral stenosis. 6. The aortic valve is tricuspid. Aortic valve regurgitation is not visualized. Aortic valve sclerosis is present, with no evidence of aortic valve stenosis. 7. The inferior vena cava is normal in size with greater than 50% respiratory variability, suggesting right atrial pressure of 3 mmHg. 8. Echolucent densities in the liver that would be consistent with liver cysts.  Comparison(s): Prior images reviewed side by side. EF 55%, RVSP 35 mmHg, moderate MR. MR has improved. Echolucency not seen on prior subcostal images.  Conclusion(s)/Recommendation(s): Consider liver ultrasound if clinically indicated.  FINDINGS Left Ventricle: Left ventricular ejection fraction, by estimation, is 60 to 65%. The left ventricle has normal function. Left ventricular endocardial border not optimally defined to evaluate regional wall motion. 3D ejection fraction reviewed and evaluated as part of the interpretation. Alternate measurement of EF is felt to be most reflective of LV function. The left ventricular internal cavity size was normal in size. There is moderate asymmetric left ventricular hypertrophy of the basal-septal segment. Left ventricular diastolic parameters are consistent with Grade I diastolic dysfunction (impaired relaxation).  Right Ventricle: The right ventricular size is moderately enlarged. No increase in right ventricular wall thickness. Right ventricular systolic function is  normal.  Left Atrium: Left atrial size was moderately dilated.  Right Atrium: Right atrial size was mildly dilated.  Pericardium: There is no evidence of pericardial effusion.  Mitral Valve: The mitral valve is grossly normal. Trivial mitral valve regurgitation. No evidence of mitral valve stenosis.  Tricuspid Valve: The tricuspid valve is normal in structure. Tricuspid valve regurgitation is trivial. No evidence of tricuspid stenosis.  Aortic Valve: The aortic valve is tricuspid. Aortic valve regurgitation is not visualized. Aortic valve sclerosis is present, with no evidence of aortic valve stenosis. Aortic valve mean gradient measures 5.0 mmHg. Aortic valve peak gradient measures 12.2 mmHg. Aortic valve area, by VTI measures 2.52 cm.  Pulmonic Valve: The pulmonic valve was not well visualized. Pulmonic valve regurgitation is mild.  Aorta: The aortic root, ascending aorta and aortic arch are all structurally normal, with no evidence of dilitation or obstruction.  Venous: The inferior vena cava is normal in size with greater than 50% respiratory variability, suggesting right atrial pressure of 3 mmHg.  IAS/Shunts: No atrial level shunt detected by color flow Doppler.   LEFT VENTRICLE PLAX 2D LVIDd:         5.43 cm   Diastology LVIDs:         3.45 cm  LV e' medial:    4.91 cm/s LV PW:         1.12 cm   LV E/e' medial:  16.8 LV IVS:        1.Daugherty cm   LV e' lateral:   7.18 cm/s LVOT diam:     2.Daugherty cm   LV E/e' lateral: 11.5 LV SV:         84 LV SV Index:   36 LVOT Area:     3.46 cm  3D Volume EF: 3D EF:        68 % LV EDV:       162 ml LV ESV:       53 ml LV SV:        109 ml  RIGHT VENTRICLE RV S prime:     15.30 cm/s TAPSE (M-mode): 3.1 cm  LEFT ATRIUM              Index        RIGHT ATRIUM           Index LA diam:        4.50 cm  1.92 cm/m   RA Area:     19.80 cm LA Vol (A2C):   113.0 ml 48.28 ml/m  RA Volume:   48.Daugherty ml  20.55 ml/m LA Vol (A4C):   86.5 ml   36.96 ml/m LA Biplane Vol: 109.0 ml 46.57 ml/m AORTIC VALVE                     PULMONIC VALVE AV Area (Vmax):    2.38 cm      PV Vmax:          1.22 m/s AV Area (Vmean):   2.35 cm      PV Peak grad:     6.0 mmHg AV Area (VTI):     2.52 cm      PR End Diast Vel: 9.30 msec AV Vmax:           175.00 cm/s AV Vmean:          105.000 cm/s AV VTI:            0.333 m AV Peak Grad:      12.2 mmHg AV Mean Grad:      5.0 mmHg LVOT Vmax:         120.00 cm/s LVOT Vmean:        71.200 cm/s LVOT VTI:          0.242 m LVOT/AV VTI ratio: 0.73  AORTA Ao Root diam: 3.40 cm Ao Asc diam:  3.Daugherty cm Ao Arch diam: 3.1 cm  MITRAL VALVE               TRICUSPID VALVE MV Area (PHT): 2.54 cm    TR Peak grad:   32.5 mmHg MV Decel Time: 299 msec    TR Vmax:        285.00 cm/s MV E velocity: 82.70 cm/s MV A velocity: 92.50 cm/s  SHUNTS MV E/A ratio:  0.89        Systemic VTI:  0.24 m Systemic Diam: 2.Daugherty cm  Stanly Leavens MD Electronically signed by Stanly Leavens MD Signature Date/Time: 02/14/2023/4:59:24 PM    Final      CT SCANS  CT CORONARY MORPH W/CTA COR W/SCORE 02/14/2023  Addendum 02/21/2023  5:30 PM ADDENDUM REPORT: 02/21/2023 17:28  EXAM: OVER-READ INTERPRETATION  CT CHEST  The following report is an over-read  performed by radiologist Dr. Fonda Mom [facility] on 02/21/2023. This over-read does not include interpretation of cardiac or coronary anatomy or pathology. The coronary CTA interpretation by the cardiologist is attached.  COMPARISON:  01/16/2017.  FINDINGS: Cardiovascular: Cardiomegaly. See findings discussed in the body of the report.  Mediastinum/Nodes: No suspicious adenopathy identified. Imaged mediastinal structures are unremarkable.  Lungs/Pleura: There is dependent basilar subsegmental atelectasis. No pneumonia or pulmonary edema. No pleural effusion or pneumothorax.  Upper Abdomen: No acute  abnormality.  Musculoskeletal: No chest wall abnormality. There are thoracic degenerative changes.  IMPRESSION: No acute extracardiac incidental findings identified.   Electronically Signed By: Fonda Field M.D. On: 02/21/2023 17:28  Narrative CLINICAL DATA:  Chest pain  EXAM: Cardiac CTA  MEDICATIONS: Sub lingual nitro. 4mg  and lopressor  50mg   TECHNIQUE: The patient was scanned on a Siemens Force 192 slice scanner. Gantry rotation speed was 250 msecs. Collimation was .6 mm. A 100 kV prospective scan was triggered in the ascending thoracic aorta at 140 HU's Full mA was used between 35% and 75% of the R-R interval. Average HR during the scan was 66 bpm. The 3D data set was interpreted on a dedicated work station using MPR, MIP and VRT modes. A total of 80 cc of contrast was used.  FINDINGS: Non-cardiac: See separate report from [facility]. No significant findings on limited lung and soft tissue windows.  Calcium  Score: Minimal calcium  noted in RCA  Coronary Arteries: Right dominant with no anomalies  LM: Normal  LAD: Normal  D1: Normal  D2: Normal  Circumflex: Normal  OM1: Tortuous normal  OM2: Normal  RCA: 1-24% calcified plaque proximally  PDA: Normal  PLA: Normal  Normal ascending thoracic aorta 3.0 cm Dilated main PA 4.0 cm No evidence of PFO/ASD/VSD Pulmonary vein drainage also appears normal  IMPRESSION: 1. Calcium  score 1.22 which is 51 st percentile for age /sex  2. Poor quality study due to patient size and presumed some degree of pulmonary HTN with dilated main PA 4.0 cm  3.  CAD RADS 1 non obstructive CAD see description above  4.  Normal ascending thoracic aorta 3.0 cm  5. No obvious shunting in Atrial septum, ventricular septum and normal PV anatomy as visualized  Maude Emmer  Electronically Signed: By: Maude Emmer M.D. On: 02/14/2023 15:40      ______________________________________________________________________________________________         Risk Assessment/Calculations:             Physical Exam:   VS:  BP 112/68   Pulse 64   Ht 5' 5 (1.651 m)   Wt 297 lb 12.8 oz (135.1 kg)   SpO2 100%   BMI 49.56 kg/m    Wt Readings from Last 3 Encounters:  02/03/24 297 lb 12.8 oz (135.1 kg)  01/05/24 295 lb (133.8 kg)  11/10/23 293 lb (132.9 kg)    GEN: Well nourished, overweight, well developed in no acute distress NECK: No JVD; No carotid bruits CARDIAC: RRR, no murmurs, rubs, gallops RESPIRATORY:  Clear to auscultation without rales, wheezing or rhonchi  ABDOMEN: Soft, non-tender, non-distended EXTREMITIES:  No edema; No deformity   ASSESSMENT AND PLAN: .    Asthma - follows with Dr. Shellia of pulmonology.  No evidence of acute exacerbation.  Nonobstructive CAD / Aortic atherosclerosis / Hx of TIA / HLD, LDL goal <70- 01/2023 CCTA revealed RCA with 1-24% stenosis. Echo 01/2023 LVEF 60-65%, gr1dd, mild MR. Her present chest pain moves throughout her chest, occurs  at rest and is atypical for angina.  EKG today no acute ST/T wave changes.  No indication for ischemic evaluation. GDMT includes atorvastatin  10mg  daily, Plavix 75mg  daily, metoprolol  succinate 25mg  daily.   DOE - reports progressive dyspnea with exertion over last 2-3 months. Workup with primary care and pulmonology unremarkable. Obesity and physical deconditioning felt to be contributory. Concern for progressive diastolic dysfunction/diastolic heart failure. 01/21/24 BNP 116.  Update echocardiogram Add Jardiance  10mg  daily for diastolic dysfunction BMET on day of echo for monitoring  OSA - CPAP compliance encouraged.    HTN- BP well controlled. Continue current antihypertensive regimen.    BMI 49 / Morbid Obesity - Weight loss via diet and exercise encouraged. Discussed the impact being overweight would have on cardiovascular risk. A1c previously prediabetic,    IDA-follows with Dr. Sherrod of hematology.  TIA - hx of TIA 2010. Lipid and BP management, as above. Will submit prior authorization for Wegovy  to see if approved by Medicare.       Dispo: follow up in 2-3 months  Signed, Reche GORMAN Finder, NP

## 2024-02-04 ENCOUNTER — Encounter: Payer: Self-pay | Admitting: Pulmonary Disease

## 2024-02-04 ENCOUNTER — Telehealth: Payer: Self-pay | Admitting: Pharmacy Technician

## 2024-02-04 ENCOUNTER — Other Ambulatory Visit (HOSPITAL_COMMUNITY): Payer: Self-pay

## 2024-02-04 ENCOUNTER — Encounter: Payer: Self-pay | Admitting: Internal Medicine

## 2024-02-04 NOTE — Telephone Encounter (Signed)
 Pharmacy Patient Advocate Encounter  Received notification from [facility] that Prior Authorization for Wegovy  has been APPROVED from 02/04/24 to 06/23/24. Ran test claim, Copay is $608.97- one month. This test claim was processed through [facility]- copay amounts may vary at other pharmacies due to pharmacy/plan contracts, or as the patient moves through the different stages of their insurance plan.   PA #/Case ID/Reference #: [id number]

## 2024-02-04 NOTE — Telephone Encounter (Signed)
 Pharmacy Patient Advocate Encounter   Received notification from Physician's Office that prior authorization for Wegovy  0.25MG  is required/requested.   Insurance verification completed.   The patient is insured through [employer] .   Per test claim: PA required; PA submitted to above mentioned insurance via Latent Key/confirmation #/EOC [id number] Status is pending

## 2024-03-01 ENCOUNTER — Ambulatory Visit

## 2024-03-01 ENCOUNTER — Ambulatory Visit (INDEPENDENT_AMBULATORY_CARE_PROVIDER_SITE_OTHER)

## 2024-03-01 VITALS — BP 148/84 | HR 59 | Temp 97.8°F | Resp 18 | Ht 65.0 in | Wt 295.0 lb

## 2024-03-01 DIAGNOSIS — J4550 Severe persistent asthma, uncomplicated: Secondary | ICD-10-CM | POA: Diagnosis not present

## 2024-03-01 MED ORDER — BENRALIZUMAB 30 MG/ML ~~LOC~~ SOSY
30.0000 mg | PREFILLED_SYRINGE | Freq: Once | SUBCUTANEOUS | Status: AC
  Administered 2024-03-01: 30 mg via SUBCUTANEOUS
  Filled 2024-03-01: qty 1

## 2024-03-01 NOTE — Progress Notes (Signed)
 Diagnosis: Asthma  Provider:  Mannam, Praveen MD  Procedure: Injection  Fasenra  (Benralizumab ), Dose: 30 mg, Site: subcutaneous, Number of injections: 1  Injection Site(s): Left arm  Post Care:    Discharge: Condition: Good, Destination: Home . AVS Declined  Performed by:  Alexande Sheerin, RN      ]]

## 2024-03-02 ENCOUNTER — Telehealth (HOSPITAL_BASED_OUTPATIENT_CLINIC_OR_DEPARTMENT_OTHER): Payer: Self-pay | Admitting: Cardiovascular Disease

## 2024-03-02 ENCOUNTER — Encounter (HOSPITAL_BASED_OUTPATIENT_CLINIC_OR_DEPARTMENT_OTHER): Payer: Self-pay

## 2024-03-02 DIAGNOSIS — M7989 Other specified soft tissue disorders: Secondary | ICD-10-CM

## 2024-03-02 DIAGNOSIS — M79604 Pain in right leg: Secondary | ICD-10-CM

## 2024-03-02 NOTE — Telephone Encounter (Signed)
 New order printed with this info on it.

## 2024-03-02 NOTE — Telephone Encounter (Signed)
 Mychart message sent to patient.

## 2024-03-02 NOTE — Telephone Encounter (Signed)
 Patient stated she has been having constant throbbing pain behind her right knee and is unable to move around with cane or wheelchair.  Patient wants to get order to have vascular test to check for blood clot.  Patient noted she also sent a MyChart message.

## 2024-03-02 NOTE — Telephone Encounter (Signed)
 Order faxed for extra large rollator

## 2024-03-02 NOTE — Telephone Encounter (Signed)
 Left message to call back.

## 2024-03-03 ENCOUNTER — Ambulatory Visit (HOSPITAL_BASED_OUTPATIENT_CLINIC_OR_DEPARTMENT_OTHER)

## 2024-03-03 DIAGNOSIS — M7989 Other specified soft tissue disorders: Secondary | ICD-10-CM | POA: Diagnosis not present

## 2024-03-03 DIAGNOSIS — R0609 Other forms of dyspnea: Secondary | ICD-10-CM

## 2024-03-03 DIAGNOSIS — M79604 Pain in right leg: Secondary | ICD-10-CM

## 2024-03-03 LAB — ECHOCARDIOGRAM COMPLETE
Area-P 1/2: 3.08 cm2
S' Lateral: 2.89 cm

## 2024-03-04 ENCOUNTER — Other Ambulatory Visit: Payer: Self-pay | Admitting: Cardiovascular Disease

## 2024-03-04 ENCOUNTER — Ambulatory Visit (HOSPITAL_BASED_OUTPATIENT_CLINIC_OR_DEPARTMENT_OTHER): Payer: Self-pay | Admitting: Family

## 2024-03-04 DIAGNOSIS — I700 Atherosclerosis of aorta: Secondary | ICD-10-CM

## 2024-03-04 DIAGNOSIS — R0609 Other forms of dyspnea: Secondary | ICD-10-CM

## 2024-03-04 DIAGNOSIS — I10 Essential (primary) hypertension: Secondary | ICD-10-CM

## 2024-03-04 DIAGNOSIS — I340 Nonrheumatic mitral (valve) insufficiency: Secondary | ICD-10-CM

## 2024-03-04 NOTE — Telephone Encounter (Signed)
 See separate mychart message in regards to pts questions/concerns.

## 2024-03-05 MED ORDER — METOPROLOL SUCCINATE ER 25 MG PO TB24: ORAL_TABLET | ORAL | 3 refills | Status: AC

## 2024-04-09 ENCOUNTER — Ambulatory Visit (INDEPENDENT_AMBULATORY_CARE_PROVIDER_SITE_OTHER): Admitting: Family

## 2024-04-09 ENCOUNTER — Other Ambulatory Visit (HOSPITAL_BASED_OUTPATIENT_CLINIC_OR_DEPARTMENT_OTHER): Payer: Self-pay

## 2024-04-09 ENCOUNTER — Encounter (HOSPITAL_BASED_OUTPATIENT_CLINIC_OR_DEPARTMENT_OTHER): Payer: Self-pay | Admitting: Family

## 2024-04-09 VITALS — BP 138/78 | HR 60 | Ht 65.0 in

## 2024-04-09 DIAGNOSIS — E785 Hyperlipidemia, unspecified: Secondary | ICD-10-CM | POA: Insufficient documentation

## 2024-04-09 DIAGNOSIS — G4733 Obstructive sleep apnea (adult) (pediatric): Secondary | ICD-10-CM | POA: Diagnosis not present

## 2024-04-09 DIAGNOSIS — I25118 Atherosclerotic heart disease of native coronary artery with other forms of angina pectoris: Secondary | ICD-10-CM | POA: Diagnosis not present

## 2024-04-09 DIAGNOSIS — I5031 Acute diastolic (congestive) heart failure: Secondary | ICD-10-CM

## 2024-04-09 DIAGNOSIS — I5189 Other ill-defined heart diseases: Secondary | ICD-10-CM | POA: Diagnosis not present

## 2024-04-09 DIAGNOSIS — R0602 Shortness of breath: Secondary | ICD-10-CM | POA: Insufficient documentation

## 2024-04-09 NOTE — Patient Instructions (Addendum)
 Medication Instructions:   Your physician recommends that you continue on your current medications as directed. Please refer to the Current Medication list given to you today.   *If you need a refill on your cardiac medications before your next appointment, please call your pharmacy*  Lab Work:  TODAY!!!! BMET/CBC/BNP  If you have labs (blood work) drawn today and your tests are completely normal, you will receive your results only by: MyChart Message (if you have MyChart) OR A paper copy in the mail If you have any lab test that is abnormal or we need to change your treatment, we will call you to review the results.  Testing/Procedures:  None ordered.   Follow-Up: At [facility], you and your health needs are our priority.  As part of our continuing mission to provide you with exceptional heart care, our providers are all part of one team.  This team includes your primary Cardiologist (physician) and Advanced Practice Providers or APPs (Physician Assistants and Nurse Practitioners) who all work together to provide you with the care you need, when you need it.  Your next appointment:   6 month(s)  Provider:   Annabella Scarce, MD, Rosaline Bane, NP, or Reche Finder, NP    We recommend signing up for the patient portal called MyChart.  Sign up information is provided on this After Visit Summary.  MyChart is used to connect with patients for Virtual Visits (Telemedicine).  Patients are able to view lab/test results, encounter notes, upcoming appointments, etc.  Non-urgent messages can be sent to your provider as well.   To learn more about what you can do with MyChart, go to [email or web address].   Other Instructions  Your physician wants you to follow-up in: 6 months.  You will receive a reminder letter in the mail two months in advance. If you don't receive a letter, please call our office to schedule the follow-up appointment.      [facility]  [place] [address] [place], [place] [address] [phone]

## 2024-04-09 NOTE — Progress Notes (Signed)
 Cardiology Office Note:  .   Date:  04/09/2024  ID:  Idolina, Mantell 05-16-1951, MRN [id number] PCP: Graydon Mt, MD  [facility] Providers Cardiologist:  Annabella Scarce, MD    History of Present Illness: Tamara Daugherty   RAELEEN WINSTANLEY is a 73 y.o. female  with a hx of asthma, GERD, TIA, hypothyroidism, hypertension, sleep apnea on CPAP, obesity, IDA, mitral regurgitation.  Previously evaluated Dr. Charls in 2018 with echocardiogram normal LVEF, mild MR.  Cardiac catheterization greater than 20 years ago unremarkable.   Coronary CT 02/14/23 calcium  score of 1.22 placing her in 51st percentile fo rage/sex with RCA minima 1-24% calcified plaque proximally. Echo 02/14/23 LVFE 60-65%, moderate asymmetric LVH, gr1dd, RV moderately enlarged with normal function, moderate LAE, mild RAE, trivial MR.   At visit 02/03/2024 updated echocardiogram recommended.  Jardiance  10 mg daily added for diastolic dysfunction.  Echo 03/03/2024 LVEF 70 to 70%, no RWMA, mild LVH, grade 1 diastolic dysfunction, RV normal, mild LAE, moderate RAE, no significant valvular normalities.  Admitted 9/18 - 03/17/2024 to [facility] after presenting with near syncope and severe hip/leg pain.  She had near syncope while at [facility] appointment and was presented to the ED.  Labs with AKI, hyponatremia, hypokalemia.  EKG unremarkable.  In ED unresponsive for a few minutes with low BP high heart rate, regained consciousness with no deficit, CT head negative and neurology with no recommendation for further workup.  Suspected vagal syncope due to pain/anxiety.  Echo during admission LVEF 55%, moderate LVH, no significant valvular dysfunction.  Diltiazem , metoprolol , hydralazine continued. Olmesartan -hydrochlorothiazide  was discontinued due to hyponatremia.  Gabapentin 300 nightly started.  BuSpar initiated for anxiety.  NA 130 on discharge.  Plan was for outpatient epidural for pain management. She was discharged to [facility].  Presents today for follow-up with her husband.  Just returned home from [facility] yesterday.  Some confusion regarding her medication regimen, she plans to review pill bottles at home.  She notes bilateral lower extremity edema which is persistent despite daily dosing of Lasix.  She does sit with her legs elevated.  Had injection in her right hip 2 weeks ago with plan for left hip injection upcoming to help with orthopedic related pain.  She notes that her dyspnea is worsening particularly at night despite wearing her CPAP and encouraged to schedule follow-up with pulmonology.  No chest pain, orthopnea.  She reports just feeling generally unwell, fatigued.  ROS: Please see the history of present illness.    All other systems reviewed and are negative.   Studies Reviewed: .       Cardiac Studies & Procedures   ______________________________________________________________________________________________     ECHOCARDIOGRAM  ECHOCARDIOGRAM COMPLETE 03/03/2024  Narrative ECHOCARDIOGRAM REPORT    Patient Name:   Tamara Daugherty Date of Exam: 03/03/2024 Medical Rec #:  [id number]     Height:       65.0 in Accession #:    [id number]    Weight:       295.0 lb Date of Birth:  1950/07/15      BSA:          2.333 m Patient Age:    73 years      BP:           112/68 mmHg Patient Gender: F             HR:           58 bpm. Exam Location:  Outpatient  Procedure: 2D Echo, 3D Echo, Color Doppler, Cardiac Doppler and Strain Analysis (Both Spectral and Color Flow Doppler were utilized during procedure).  Indications:    Dyspnea  History:        Patient has prior history of Echocardiogram examinations, most recent 02/14/2023. TIA, Signs/Symptoms:Murmur; Risk Factors:Non-Smoker. Mitral regurgitation.  Sonographer:    Orvil Holmes RDCS Referring Phys: [id number] Sunjai Levandoski S Aspen Deterding  IMPRESSIONS   1. Left ventricular ejection fraction, by estimation, is 70 to 75%. Left ventricular ejection  fraction by PLAX is 77 %. The left ventricle has hyperdynamic function. The left ventricle has no regional wall motion abnormalities. There is mild concentric left ventricular hypertrophy. Left ventricular diastolic parameters are consistent with Grade I diastolic dysfunction (impaired relaxation). 2. Right ventricular systolic function is normal. The right ventricular size is normal. 3. Left atrial size was mildly dilated. 4. Right atrial size was moderately dilated. 5. The mitral valve is normal in structure. No evidence of mitral valve regurgitation. No evidence of mitral stenosis. 6. The aortic valve is normal in structure. Aortic valve regurgitation is not visualized. No aortic stenosis is present. 7. The inferior vena cava is normal in size with greater than 50% respiratory variability, suggesting right atrial pressure of 3 mmHg.  FINDINGS Left Ventricle: Left ventricular ejection fraction, by estimation, is 70 to 75%. Left ventricular ejection fraction by PLAX is 77 %. The left ventricle has hyperdynamic function. The left ventricle has no regional wall motion abnormalities. Global longitudinal strain performed but not reported based on interpreter judgement due to suboptimal tracking. 3D ejection fraction reviewed and evaluated as part of the interpretation. Alternate measurement of EF is felt to be most reflective of LV function. The left ventricular internal cavity size was normal in size. There is mild concentric left ventricular hypertrophy. Left ventricular diastolic parameters are consistent with Grade I diastolic dysfunction (impaired relaxation). Indeterminate filling pressures.  Right Ventricle: The right ventricular size is normal. No increase in right ventricular wall thickness. Right ventricular systolic function is normal.  Left Atrium: Left atrial size was mildly dilated.  Right Atrium: Right atrial size was moderately dilated.  Pericardium: There is no evidence of  pericardial effusion.  Mitral Valve: The mitral valve is normal in structure. No evidence of mitral valve regurgitation. No evidence of mitral valve stenosis.  Tricuspid Valve: The tricuspid valve is normal in structure. Tricuspid valve regurgitation is not demonstrated. No evidence of tricuspid stenosis.  Aortic Valve: The aortic valve is normal in structure. Aortic valve regurgitation is not visualized. No aortic stenosis is present.  Pulmonic Valve: The pulmonic valve was normal in structure. Pulmonic valve regurgitation is not visualized. No evidence of pulmonic stenosis.  Aorta: The aortic root is normal in size and structure.  Venous: The inferior vena cava is normal in size with greater than 50% respiratory variability, suggesting right atrial pressure of 3 mmHg.  IAS/Shunts: No atrial level shunt detected by color flow Doppler.   LEFT VENTRICLE PLAX 2D LV EF:         Left            Diastology ventricular     LV e' medial:    5.98 cm/s ejection        LV E/e' medial:  12.3 fraction by     LV e' lateral:   6.31 cm/s PLAX is 77      LV E/e' lateral: 11.6 %. LVIDd:         5.33 cm  2D Longitudinal LVIDs:         2.89 cm         Strain LV PW:         1.18 cm         2D Strain GLS   -14.8 % LV IVS:        1.12 cm         (A4C): LVOT diam:     2.00 cm         2D Strain GLS   -12.6 % LV SV:         62              (A3C): LV SV Index:   27              2D Strain GLS   -20.3 % LVOT Area:     3.14 cm        (A2C): 2D Strain GLS   -15.0 % Avg:  3D Volume EF: 3D EF:        43 % LV EDV:       237 ml LV ESV:       134 ml LV SV:        103 ml  RIGHT VENTRICLE RV Basal diam:  5.10 cm RV Mid diam:    3.82 cm RV S prime:     14.60 cm/s TAPSE (M-mode): 3.1 cm  LEFT ATRIUM             Index        RIGHT ATRIUM           Index LA diam:        4.20 cm 1.80 cm/m   RA Area:     25.30 cm LA Vol (A2C):   78.2 ml 33.51 ml/m  RA Volume:   85.20 ml  36.51 ml/m LA Vol (A4C):    49.9 ml 21.38 ml/m LA Biplane Vol: 67.8 ml 29.06 ml/m AORTIC VALVE LVOT Vmax:   88.60 cm/s LVOT Vmean:  55.500 cm/s LVOT VTI:    0.197 m  AORTA Ao Root diam: 3.20 cm Ao Asc diam:  3.10 cm  MITRAL VALVE MV Area (PHT): 3.08 cm     SHUNTS MV Decel Time: 246 msec     Systemic VTI:  0.20 m MV E velocity: 73.40 cm/s   Systemic Diam: 2.00 cm MV A velocity: 108.00 cm/s MV E/A ratio:  0.68  Annabella Scarce MD Electronically signed by Annabella Scarce MD Signature Date/Time: 03/03/2024/3:05:19 PM    Final      CT SCANS  CT CORONARY MORPH W/CTA COR W/SCORE 02/14/2023  Addendum 02/21/2023  5:30 PM ADDENDUM REPORT: 02/21/2023 17:28  EXAM: OVER-READ INTERPRETATION  CT CHEST  The following report is an over-read performed by radiologist Dr. Fonda Mom [facility] on 02/21/2023. This over-read does not include interpretation of cardiac or coronary anatomy or pathology. The coronary CTA interpretation by the cardiologist is attached.  COMPARISON:  01/16/2017.  FINDINGS: Cardiovascular: Cardiomegaly. See findings discussed in the body of the report.  Mediastinum/Nodes: No suspicious adenopathy identified. Imaged mediastinal structures are unremarkable.  Lungs/Pleura: There is dependent basilar subsegmental atelectasis. No pneumonia or pulmonary edema. No pleural effusion or pneumothorax.  Upper Abdomen: No acute abnormality.  Musculoskeletal: No chest wall abnormality. There are thoracic degenerative changes.  IMPRESSION: No acute extracardiac incidental findings identified.   Electronically Signed By: Fonda Field M.D. On: 02/21/2023 17:28  Narrative CLINICAL DATA:  Chest pain  EXAM: Cardiac CTA  MEDICATIONS: Sub lingual nitro. 4mg  and lopressor  50mg   TECHNIQUE: The patient was scanned on a Siemens Force 192 slice scanner. Gantry rotation speed was 250 msecs. Collimation was .6 mm. A 100 kV prospective scan was triggered in the  ascending thoracic aorta at 140 HU's Full mA was used between 35% and 75% of the R-R interval. Average HR during the scan was 66 bpm. The 3D data set was interpreted on a dedicated work station using MPR, MIP and VRT modes. A total of 80 cc of contrast was used.  FINDINGS: Non-cardiac: See separate report from [facility]. No significant findings on limited lung and soft tissue windows.  Calcium  Score: Minimal calcium  noted in RCA  Coronary Arteries: Right dominant with no anomalies  LM: Normal  LAD: Normal  D1: Normal  D2: Normal  Circumflex: Normal  OM1: Tortuous normal  OM2: Normal  RCA: 1-24% calcified plaque proximally  PDA: Normal  PLA: Normal  Normal ascending thoracic aorta 3.0 cm Dilated main PA 4.0 cm No evidence of PFO/ASD/VSD Pulmonary vein drainage also appears normal  IMPRESSION: 1. Calcium  score 1.22 which is 51 st percentile for age /sex  2. Poor quality study due to patient size and presumed some degree of pulmonary HTN with dilated main PA 4.0 cm  3.  CAD RADS 1 non obstructive CAD see description above  4.  Normal ascending thoracic aorta 3.0 cm  5. No obvious shunting in Atrial septum, ventricular septum and normal PV anatomy as visualized  Maude Emmer  Electronically Signed: By: Maude Emmer M.D. On: 02/14/2023 15:40     ______________________________________________________________________________________________           Risk Assessment/Calculations:             Physical Exam:   VS:  BP 138/78 (BP Location: Right Arm, Patient Position: Sitting, Cuff Size: Large)   Pulse 60   Ht 5' 5 (1.651 m)   SpO2 94%   BMI 49.09 kg/m    Wt Readings from Last 3 Encounters:  03/01/24 295 lb (133.8 kg)  02/03/24 297 lb 12.8 oz (135.1 kg)  01/05/24 295 lb (133.8 kg)    GEN: Well nourished, overweight, well developed in no acute distress NECK: No JVD; No carotid bruits CARDIAC: RRR, no murmurs, rubs, gallops RESPIRATORY:   Clear to auscultation without rales, wheezing or rhonchi  ABDOMEN: Soft, non-tender, non-distended EXTREMITIES:  No edema; No deformity   ASSESSMENT AND PLAN: .    Asthma - follows with Dr. Shellia of pulmonology.  Encouraged to schedule follow up due to dyspnea. Lung sounds clear on exam. I do suspect some of her DOE/fatigue is related to deconditioning and will gradually improve as she gets further out from her hospitalization.   Nonobstructive CAD / Aortic atherosclerosis / Hx of TIA / HLD, LDL goal <70- 01/2023 CCTA revealed RCA with 1-24% stenosis. Echo 01/2023 LVEF 60-65%, gr1dd, mild MR. No chest pain. No indication for ischemic evaluation. GDMT includes atorvastatin  10mg  daily, Plavix 75mg  daily, metoprolol  succinate 25mg  daily.   Diastolic dysfunction- previously stopped jardiance  after 3 doses as perceived increased foot pain. Given hx of hyponatremia, will not resume. BMET, CBC, BNP today.   OSA - CPAP compliance encouraged.  Endorses wearing regularly.   HTN- BP relatively well controlled.  Olmesartan -HCTZ stopped during 02/2024 admission for hyponatremia. BMET today.  Continue diltiazem  240 mg daily, Lasix 40 mg daily, hydralazine 25 mg twice daily, Toprol  25 mg twice daily. Discussed to monitor BP  at home at least 2 hours after medications and sitting for 5-10 minutes.   BMI 49 / Morbid Obesity - Weight loss via diet and exercise encouraged. Discussed the impact being overweight would have on cardiovascular risk. A1c previously prediabetic.  [place]  prior Auth obtained 02/04/2024 however cost prohibitive with $608 per month.  IDA-follows with Dr. Sherrod of hematology.  TIA - hx of TIA 2010. Lipid and BP management, as above.        Dispo: follow up in 6 months  Signed, Reche GORMAN Finder, NP

## 2024-04-11 LAB — BRAIN NATRIURETIC PEPTIDE: BNP: 120.6 pg/mL — ABNORMAL HIGH (ref 0.0–100.0)

## 2024-04-11 LAB — BASIC METABOLIC PANEL WITH GFR
BUN/Creatinine Ratio: 12 (ref 12–28)
BUN: 8 mg/dL (ref 8–27)
CO2: 25 mmol/L (ref 20–29)
Calcium: 9.8 mg/dL (ref 8.7–10.3)
Chloride: 95 mmol/L — ABNORMAL LOW (ref 96–106)
Creatinine, Ser: 0.68 mg/dL (ref 0.57–1.00)
Glucose: 98 mg/dL (ref 70–99)
Potassium: 4.5 mmol/L (ref 3.5–5.2)
Sodium: 135 mmol/L (ref 134–144)
eGFR: 93 mL/min/1.73 (ref >59)

## 2024-04-11 LAB — CBC
Hematocrit: 36.3 % (ref 34.0–46.6)
Hemoglobin: 11.6 g/dL (ref 11.1–15.9)
MCH: 31.4 pg (ref 26.6–33.0)
MCHC: 32 g/dL (ref 31.5–35.7)
MCV: 98 fL — ABNORMAL HIGH (ref 79–97)
Platelets: 390 x10E3/uL (ref 150–450)
RBC: 3.69 x10E6/uL — ABNORMAL LOW (ref 3.77–5.28)
RDW: 13.6 % (ref 11.7–15.4)
WBC: 9.6 x10E3/uL (ref 3.4–10.8)

## 2024-04-12 ENCOUNTER — Ambulatory Visit (HOSPITAL_BASED_OUTPATIENT_CLINIC_OR_DEPARTMENT_OTHER): Payer: Self-pay | Admitting: Family

## 2024-04-26 ENCOUNTER — Ambulatory Visit (INDEPENDENT_AMBULATORY_CARE_PROVIDER_SITE_OTHER)

## 2024-04-26 VITALS — BP 132/74 | HR 58 | Temp 98.2°F | Resp 18 | Ht 65.0 in

## 2024-04-26 DIAGNOSIS — J4550 Severe persistent asthma, uncomplicated: Secondary | ICD-10-CM

## 2024-04-26 MED ORDER — BENRALIZUMAB 30 MG/ML ~~LOC~~ SOSY
30.0000 mg | PREFILLED_SYRINGE | Freq: Once | SUBCUTANEOUS | Status: AC
  Administered 2024-04-26: 30 mg via SUBCUTANEOUS

## 2024-04-26 NOTE — Progress Notes (Signed)
 Diagnosis: Asthma  Provider:  Chilton Greathouse MD  Procedure: Injection  Fasenra (Benralizumab), Dose: 30 mg, Site: subcutaneous, Number of injections: 1  Injection Site(s): Right arm  Post Care: Patient declined observation  Discharge: Condition: Good, Destination: Home . AVS Declined  Performed by:  Wyvonne Lenz, RN

## 2024-05-19 ENCOUNTER — Encounter (HOSPITAL_BASED_OUTPATIENT_CLINIC_OR_DEPARTMENT_OTHER): Payer: Self-pay

## 2024-05-25 ENCOUNTER — Telehealth: Payer: Self-pay | Admitting: *Deleted

## 2024-05-25 NOTE — Telephone Encounter (Signed)
 Pt called stating iron  and ferritin results showed levels had decreased. Pt sent results via mychart. Pt states that she's having symptoms and feels that an iron  infusion is needed. Pt has been scheduled for 12/4 with Dr. Sherrod to f/u on lab results and possible iron  infusion. Pt is aware of appt and verbalized understanding.

## 2024-05-27 ENCOUNTER — Inpatient Hospital Stay: Admitting: Internal Medicine

## 2024-06-04 ENCOUNTER — Other Ambulatory Visit (HOSPITAL_BASED_OUTPATIENT_CLINIC_OR_DEPARTMENT_OTHER): Payer: Self-pay | Admitting: Family

## 2024-06-08 NOTE — Telephone Encounter (Signed)
 TY!  Reche GORMAN Finder, NP

## 2024-06-09 ENCOUNTER — Encounter: Payer: Self-pay | Admitting: Pulmonary Disease

## 2024-06-09 ENCOUNTER — Encounter (HOSPITAL_COMMUNITY): Payer: Self-pay | Admitting: Pulmonary Disease

## 2024-06-09 ENCOUNTER — Inpatient Hospital Stay: Attending: Internal Medicine | Admitting: Internal Medicine

## 2024-06-09 ENCOUNTER — Other Ambulatory Visit: Payer: Self-pay | Admitting: Internal Medicine

## 2024-06-09 ENCOUNTER — Other Ambulatory Visit (HOSPITAL_COMMUNITY): Payer: Self-pay | Admitting: Internal Medicine

## 2024-06-09 ENCOUNTER — Telehealth: Payer: Self-pay

## 2024-06-09 VITALS — BP 138/71 | HR 63 | Resp 17 | Ht 65.0 in | Wt 312.0 lb

## 2024-06-09 DIAGNOSIS — D500 Iron deficiency anemia secondary to blood loss (chronic): Secondary | ICD-10-CM | POA: Diagnosis not present

## 2024-06-09 DIAGNOSIS — D509 Iron deficiency anemia, unspecified: Secondary | ICD-10-CM | POA: Diagnosis present

## 2024-06-09 NOTE — Progress Notes (Signed)
 [facility] [phone]   [phone]  OFFICE PROGRESS NOTE  Graydon Mt, MD [address] [place] [address]  DIAGNOSIS: Persistent iron  deficiency anemia with no clear etiology except for possibility of dietary insufficiency and the patient did not have any improvement in her anemia with the oral iron  tablet  PRIOR THERAPY: Iron  infusion with Venofer  300 mg IV weekly for 3 weeks.  CURRENT THERAPY: Vitron-C 1 tablet p.o. daily.  INTERVAL HISTORY: Tamara Daugherty 73 y.o. female returns to the clinic today for follow-up visit accompanied by her husband Tamara Daugherty.  The patient was last seen in July 2024 she was lost to follow-up since that time.Discussed the use of AI scribe software for clinical note transcription with the patient, who gave verbal consent to proceed.  History of Present Illness Tamara Daugherty is a 73 year old female with recurrent iron  deficiency anemia who presents for evaluation of worsening anemia despite ongoing iron  supplementation.  She has experienced a decline in hemoglobin to 9.3 g/dL and hematocrit to 69.5%, with iron  studies on May 24, 2024 showing low serum iron  (19), iron  saturation (5%), and ferritin (11), despite adherence to daily oral Veteron C. She previously responded well to iron  infusion therapy, which resulted in significant improvement in anemia.  She reports worsening shortness of breath and now requires supplemental oxygen. She denies current chest pain and dizziness, though she had prior episodes of dizziness that have since resolved. She does not experience gastrointestinal discomfort from iron  supplementation.  There is no report of overt gastrointestinal bleeding. She is scheduled for a repeat colonoscopy on June 22, 2024 due to prior colonic polyps, and has known hemorrhoids, which are recognized as a potential source of blood loss.  Comorbidities include congestive heart failure and spinal  stenosis, and she is followed by cardiology. There is ongoing concern regarding possible duplicate cardiology medications.     MEDICAL HISTORY: Past Medical History:  Diagnosis Date   Allergy     Anemia    takes Iron  daily   Anxiety    Arthritis    Asthma    Symbicort  daily and Albuterol  daily as needed   B12 deficiency    Back pain    Bilateral swelling of feet    Bruises easily    d/t being on Plavix    Chest pain    Clotting disorder 2022   Complication of anesthesia    long time to wake up - referred to pulmonary after carpel tunnel procedure - for asthma and undiagnosed sleep apnea   Constipation    Depression    GERD (gastroesophageal reflux disease)    takes Protonix  daily   Headache    Heart murmur    [facility]   Heart murmur    History of blood transfusion    no abnormal reaction noted   History of colon polyps    beningn   History of migraine    couple of wks ago was the last one   Hyperlipidemia    takes Atorvastatin  daily   Hypertension    takes Benicar  and Diltiazem  daily   Hypothyroidism    takes Synthroid  daily   Itching    takes Atarax  daily as needed   Joint pain    Joint pain    Joint swelling    Lactose intolerance    Leg swelling    Liver cyst    Mini stroke    Mitral regurgitation 01/27/2023  Multiple allergies    takes Claritin  daily as needed as well as using Flonase  if needed   Muscle spasm    takes Baclofen  if needed   Osteoarthritis    Peripheral edema    takes Torsemide  daily   Pneumonia    hx of   Prediabetes    Primary localized osteoarthritis of left knee 06/21/2014   Primary localized osteoarthritis of right knee 03/21/2015   Restrictive lung disease    Sleep apnea    cpap   Sleep apnea    Stroke [facility]) 2010   Swallowing difficulty    TIA (transient ischemic attack)    takes Plavix daily;notices right side is slightly weaker than left   Vertigo    takes Meclizine  daily as needed   Vitamin D   deficiency     ALLERGIES:  is allergic to bee venom, diphenhydramine  hcl, diphenhydramine  hcl, penicillins, sulfa antibiotics, wasp venom, oxycodone , percocet [oxycodone -acetaminophen ], propoxyphene, doxycycline , and oxycodone -acetaminophen .  MEDICATIONS:  Current Outpatient Medications  Medication Sig Dispense Refill   acetaminophen  (TYLENOL ) 325 MG tablet Take 650 mg by mouth every 4 (four) hours as needed (pain).     albuterol  (VENTOLIN  HFA) 108 (90 Base) MCG/ACT inhaler TAKE 2 PUFFS BY MOUTH EVERY 6 HOURS AS NEEDED FOR WHEEZE OR SHORTNESS OF BREATH 18 each 3   atorvastatin  (LIPITOR) 10 MG tablet Take 10 mg by mouth daily.     budesonide  (PULMICORT ) 0.5 MG/2ML nebulizer solution USE 1 VIAL  IN  NEBULIZER TWICE  DAILY - At 10 AM And 5 PM - Rinse Mouth After Treatment 60 mL 11   budesonide -glycopyrrolate -formoterol  (BREZTRI AEROSPHERE) 160-9-4.8 MCG/ACT AERO inhaler Inhale 2 puffs into the lungs. (Patient taking differently: Inhale 2 puffs into the lungs in the morning and at bedtime.)     busPIRone (BUSPAR) 5 MG tablet Take 5 mg by mouth 2 (two) times daily.     Calcium  Carb-Cholecalciferol (CALCIUM  CARBONATE+VITAMIN D  PO) Take 1 tablet by mouth daily.     clopidogrel (PLAVIX) 75 MG tablet Take 1 tablet by mouth daily.     diltiazem  (CARDIZEM  CD) 240 MG 24 hr capsule Take 240 mg by mouth daily.     EPINEPHrine  0.3 mg/0.3 mL IJ SOAJ injection Inject 0.3 mg into the muscle once.     fexofenadine-pseudoephedrine (ALLEGRA-D 24) 180-240 MG 24 hr tablet Take 1 tablet by mouth daily.     fluticasone  (FLONASE ) 50 MCG/ACT nasal spray Place 1 spray into both nostrils 2 (two) times daily as needed for allergies or rhinitis.     formoterol  (PERFOROMIST ) 20 MCG/2ML nebulizer solution USE 1 VIAL  IN  NEBULIZER TWICE  DAILY - - Morning And Evening 60 mL 11   furosemide (LASIX) 40 MG tablet Take 40 mg by mouth daily.     gabapentin (NEURONTIN) 100 MG capsule Take 100 mg by mouth in the morning.      gabapentin (NEURONTIN) 300 MG capsule Take 300 mg by mouth every evening.     hydrALAZINE (APRESOLINE) 25 MG tablet TAKE 1 TABLET BY MOUTH EVERY DAY IN THE MORNING AND AT BEDTIME     HYDROcodone -acetaminophen  (NORCO/VICODIN) 5-325 MG tablet Take 1 tablet by mouth every 6 (six) hours as needed (pain).     ipratropium-albuterol  (DUONEB) 0.5-2.5 (3) MG/3ML SOLN Inhale into the lungs.     levothyroxine  (SYNTHROID , LEVOTHROID) 50 MCG tablet Take 50 mcg by mouth daily before breakfast.     loperamide (IMODIUM) 2 MG capsule Take 2 mg by mouth every 4 (four) hours  as needed for diarrhea or loose stools.     loratadine  (CLARITIN ) 10 MG tablet Take 10 mg by mouth daily as needed for allergies.     Melatonin 3 MG CAPS Take 3 mg by mouth at bedtime.     metoprolol  succinate (TOPROL -XL) 25 MG 24 hr tablet Take 1 tablet by mouth every morning and take 1 tablet by mouth every night at bedtime 180 tablet 3   montelukast  (SINGULAIR ) 10 MG tablet Take 1 tablet (10 mg total) by mouth at bedtime. 90 tablet 3   Multiple Vitamins-Minerals (MULTIVITAMIN WITH MINERALS) tablet Take 1 tablet by mouth daily.     Omega-3 Fatty Acids (FISH OIL PO) Take 1 capsule by mouth 2 (two) times daily.     OXYGEN Inhale 2 L into the lungs continuous.     pantoprazole  (PROTONIX ) 40 MG tablet Take 40 mg by mouth daily.     polyethylene glycol (MIRALAX  / GLYCOLAX ) 17 g packet Take 17 g by mouth daily as needed (swelling).     potassium chloride  (MICRO-K ) 10 MEQ CR capsule Take 30 mEq by mouth daily.  (Patient taking differently: Take 30 mEq by mouth 2 (two) times daily.)     pyridOXINE  (B-6) 50 MG tablet Take 50 mg by mouth daily.     Spacer/Aero-Holding Chambers (AEROCHAMBER MV) inhaler Use as instructed 1 each 0   tiZANidine (ZANAFLEX) 2 MG tablet Take 2 mg by mouth every 4 (four) hours as needed for muscle spasms.     Vitamin D , Ergocalciferol , (DRISDOL ) 1.25 MG (50000 UNIT) CAPS capsule Take 1 capsule (50,000 Units total) by mouth  every 7 (seven) days. 4 capsule 0   No current facility-administered medications for this visit.    SURGICAL HISTORY:  Past Surgical History:  Procedure Laterality Date   ABDOMINAL HYSTERECTOMY     BACK SURGERY     BREAST SURGERY     cyst removal   CARDIAC CATHETERIZATION     carpel tunnel Right    COLONOSCOPY     ESOPHAGOGASTRODUODENOSCOPY     JOINT REPLACEMENT     KNEE ARTHROSCOPY     TONSILLECTOMY     TOTAL KNEE ARTHROPLASTY Left 06/21/2014   Procedure: LEFT TOTAL KNEE ARTHROPLASTY;  Surgeon: Fonda SHAUNNA Olmsted, MD;  Location: [facility] OR;  Service: Orthopedics;  Laterality: Left;   TOTAL KNEE ARTHROPLASTY Right 03/21/2015   Procedure: TOTAL KNEE ARTHROPLASTY STEROID INECTION BOTH KNEES;  Surgeon: Fonda Olmsted, MD;  Location: [facility] OR;  Service: Orthopedics;  Laterality: Right;   trigger thumb      REVIEW OF SYSTEMS:  A comprehensive review of systems was negative except for: Constitutional: positive for fatigue Respiratory: positive for dyspnea on exertion Musculoskeletal: positive for arthralgias   PHYSICAL EXAMINATION: General appearance: alert, cooperative, and no distress Head: Normocephalic, without obvious abnormality, atraumatic Neck: no adenopathy, no JVD, supple, symmetrical, trachea midline, and thyroid  not enlarged, symmetric, no tenderness/mass/nodules Lymph nodes: Cervical, supraclavicular, and axillary nodes normal. Resp: clear to auscultation bilaterally Back: symmetric, no curvature. ROM normal. No CVA tenderness. Cardio: regular rate and rhythm, S1, S2 normal, no murmur, click, rub or gallop GI: soft, non-tender; bowel sounds normal; no masses,  no organomegaly Extremities: extremities normal, atraumatic, no cyanosis or edema  ECOG PERFORMANCE STATUS: 1 - Symptomatic but completely ambulatory  Blood pressure 138/71, pulse 63, resp. rate 17, height 5' 5 (1.651 m), weight (!) 312 lb (141.5 kg), SpO2 96%.  LABORATORY DATA: Lab Results  Component Value Date   WBC  9.6  04/09/2024   HGB 11.6 04/09/2024   HCT 36.3 04/09/2024   MCV 98 (H) 04/09/2024   PLT 390 04/09/2024      Chemistry      Component Value Date/Time   NA 135 04/09/2024 1031   K 4.5 04/09/2024 1031   CL 95 (L) 04/09/2024 1031   CO2 25 04/09/2024 1031   BUN 8 04/09/2024 1031   CREATININE 0.68 04/09/2024 1031   CREATININE 1.09 (H) 11/01/2020 1115      Component Value Date/Time   CALCIUM  9.8 04/09/2024 1031   ALKPHOS 49 05/02/2022 1304   AST 16 05/02/2022 1304   AST 14 (L) 11/01/2020 1115   ALT 16 05/02/2022 1304   ALT 12 11/01/2020 1115   BILITOT 0.2 05/02/2022 1304   BILITOT 0.3 11/01/2020 1115       RADIOGRAPHIC STUDIES: No results found.  ASSESSMENT AND PLAN: This is a very pleasant 73 years old African-American female with iron  deficiency anemia secondary to malabsorption and lack of benefit to the oral iron  tablets. The patient was recently found to have colon polyps that were removed. She also received iron  infusion with Venofer  for 3 doses and she tolerated it fairly well. The patient is currently on oral iron  tablet with Vitron-C.  She is tolerating the oral iron  tablet fairly well. She was found recently to have worsening anemia with iron  deficiency. Assessment and Plan Assessment & Plan Iron  deficiency anemia Chronic iron  deficiency anemia with recurrent decline in hemoglobin and iron  indices despite ongoing oral iron  supplementation. She previously responded to intravenous iron . Etiology of ongoing blood loss remains under evaluation, with scheduled repeat colonoscopy and colonic polyps and hemorrhoids as possible sources. Comorbid congestive heart failure and spinal stenosis are managed by other specialists. Family and primary care are addressing concerns regarding duplicate cardiology medications. - Arranged intravenous iron  infusion to begin December 31st, following her scheduled colonoscopy, to be administered weekly for three weeks. - Coordinated timing of  iron  infusion with her and family to avoid interference with colonoscopy preparation. - Scheduled follow-up in two months to reassess hemoglobin and iron  studies and monitor response to therapy. She was advised to call immediately if she has any concerning symptoms in the interval.  The  patient voices understanding of current disease status and treatment options and is in agreement with the current care plan.  All questions were answered. The patient knows to call the clinic with any problems, questions or concerns. We can certainly see the patient much sooner if necessary.   Disclaimer: This note was dictated with voice recognition software. Similar sounding words can inadvertently be transcribed and may not be corrected upon review.

## 2024-06-09 NOTE — Telephone Encounter (Signed)
 Hello,  Patient will be scheduled as soon as possible.  Auth Submission: NO AUTH NEEDED Site of care: Site of care: [facility] Payer: medicare a/b, [employer] [place] supp Medication & CPT/J Code(s) submitted: Feraheme  (ferumoxytol ) [id number] Diagnosis Code:  Route of submission (phone, fax, portal): portal Phone # Fax # Auth type: Buy/Bill HB Units/visits requested: 510mg  x 2 doses Reference number:  Approval from: 06/09/24 to 06/23/24

## 2024-06-09 NOTE — Telephone Encounter (Signed)
 Hello,  Auth Submission: DENIED Site of care: Site of care: [facility] Payer: Medicare A/b, [employer] [place] supp Medication & CPT/J Code(s) submitted: Venofer  (Iron  Sucrose) [id number] Diagnosis Code:  Route of submission (phone, fax, portal): phone Phone # Fax # Auth type:  Units/visits requested:  Reference number:     Authorization has been DENIED because Feraheme  is the preferred drug, would you like to change this?

## 2024-06-09 NOTE — Addendum Note (Signed)
 Addended by: DAYNE SHERRY RAMAN on: 06/09/2024 01:47 PM   Modules accepted: Orders

## 2024-06-10 ENCOUNTER — Telehealth: Payer: Self-pay

## 2024-06-10 NOTE — Telephone Encounter (Signed)
 Auth Submission: NO AUTH NEEDED Site of care: Site of care: [facility] Payer: medicare a/b and supp Medication & CPT/J Code(s) submitted: Fasenra  (Benralizumab ) [id number] Diagnosis Code:  Route of submission (phone, fax, portal): portal Phone # Fax # Auth type: Buy/Bill HB Units/visits requested: 30mg  q8weeks Reference number:  Approval from: 06/10/24 to 06/23/24

## 2024-06-11 ENCOUNTER — Telehealth: Payer: Self-pay

## 2024-06-11 ENCOUNTER — Encounter (HOSPITAL_BASED_OUTPATIENT_CLINIC_OR_DEPARTMENT_OTHER): Payer: Self-pay | Admitting: Family

## 2024-06-11 ENCOUNTER — Other Ambulatory Visit (HOSPITAL_BASED_OUTPATIENT_CLINIC_OR_DEPARTMENT_OTHER)

## 2024-06-11 ENCOUNTER — Ambulatory Visit (HOSPITAL_BASED_OUTPATIENT_CLINIC_OR_DEPARTMENT_OTHER): Admitting: Family

## 2024-06-11 VITALS — BP 126/84 | HR 58 | Ht 65.0 in | Wt 309.0 lb

## 2024-06-11 DIAGNOSIS — G4733 Obstructive sleep apnea (adult) (pediatric): Secondary | ICD-10-CM | POA: Diagnosis not present

## 2024-06-11 DIAGNOSIS — I25118 Atherosclerotic heart disease of native coronary artery with other forms of angina pectoris: Secondary | ICD-10-CM

## 2024-06-11 DIAGNOSIS — I480 Paroxysmal atrial fibrillation: Secondary | ICD-10-CM | POA: Diagnosis not present

## 2024-06-11 DIAGNOSIS — I5032 Chronic diastolic (congestive) heart failure: Secondary | ICD-10-CM

## 2024-06-11 MED ORDER — POTASSIUM CHLORIDE ER 10 MEQ PO CPCR: 10.0000 meq | ORAL_CAPSULE | Freq: Every day | ORAL | 1 refills | Status: AC

## 2024-06-11 MED ORDER — FUROSEMIDE 40 MG PO TABS: 40.0000 mg | ORAL_TABLET | Freq: Two times a day (BID) | ORAL | 1 refills | Status: AC

## 2024-06-11 MED ORDER — JARDIANCE 10 MG PO TABS: 10.0000 mg | ORAL_TABLET | Freq: Every morning | ORAL | 2 refills | Status: AC

## 2024-06-11 NOTE — Telephone Encounter (Signed)
 Spoke with patient regarding upcoming appointments. Patient reports she is scheduled for an iron  infusion on 06/14/24 and a colonoscopy on 06/22/24. Patient wanted to confirm that it is okay to proceed with the colonoscopy following the iron  infusion and that there would be no interference between the two.  Per Cassie, PA, patient is okay to proceed with the colonoscopy as scheduled, and the iron  infusion will not interfere.  Patient was also informed of follow-up appointment scheduled for 08/10/24 with labs at 9:45 AM and office visit with Dr. Sherrod at 10:15 AM. Patient voiced understanding.

## 2024-06-11 NOTE — Patient Instructions (Addendum)
 Medication Instructions:  Continue your current medications *If you need a refill on your cardiac medications before your next appointment, please call your pharmacy*  Lab Work: Dr. Graydon will likely check your labs at your visit Monday  Testing/Procedures: Your physician has recommended that you wear a Zio monitor. Take off on the day of your colonoscopy  This monitor is a medical device that records the hearts electrical activity. Doctors most often use these monitors to diagnose arrhythmias. Arrhythmias are problems with the speed or rhythm of the heartbeat. The monitor is a small device applied to your chest. You can wear one while you do your normal daily activities. While wearing this monitor if you have any symptoms to push the button and record what you felt. Once you have worn this monitor for the period of time provider prescribed (Usually 14 days), you will return the monitor device in the postage paid box. Once it is returned they will download the data collected and provide us  with a report which the provider will then review and we will call you with those results. Important tips:  Avoid showering during the first 24 hours of wearing the monitor. Avoid excessive sweating to help maximize wear time. Do not submerge the device, no hot tubs, and no swimming pools. Keep any lotions or oils away from the patch. After 24 hours you may shower with the patch on. Take brief showers with your back facing the shower head.  Do not remove patch once it has been placed because that will interrupt data and decrease adhesive wear time. Push the button when you have any symptoms and write down what you were feeling. Once you have completed wearing your monitor, remove and place into box which has postage paid and place in your outgoing mailbox.  If for some reason you have misplaced your box then call our office and we can provide another box and/or mail it off for you.   Follow-Up: At [facility], you and your health needs are our priority.  As part of our continuing mission to provide you with exceptional heart care, our providers are all part of one team.  This team includes your primary Cardiologist (physician) and Advanced Practice Providers or APPs (Physician Assistants and Nurse Practitioners) who all work together to provide you with the care you need, when you need it.  Your next appointment:   1 month(s)  Provider:   Annabella Scarce, MD or Reche Finder, NP    We recommend signing up for the patient portal called MyChart.  Sign up information is provided on this After Visit Summary.  MyChart is used to connect with patients for Virtual Visits (Telemedicine).  Patients are able to view lab/test results, encounter notes, upcoming appointments, etc.  Non-urgent messages can be sent to your provider as well.   To learn more about what you can do with MyChart, go to [email or web address].   Other Instructions  We will consider Eliquis as a blood thinner after your colonoscopy and after improving your iron  levels.   We could consider a medication called Kerendia or Spironolactone at follow up to help your body keep fluid off and also replete potassium.

## 2024-06-11 NOTE — Progress Notes (Signed)
 " Cardiology Office Note:  .   Date:  06/11/2024  ID:  Emmalina, Espericueta [birth year], MRN [id number] PCP: Graydon Mt, MD  [facility] Providers Cardiologist:  Annabella Scarce, MD    History of Present Illness: SABRA   DORTHEY DEPACE is a 73 y.o. female  with a hx of asthma, GERD, TIA, hypothyroidism, hypertension, sleep apnea on CPAP, obesity, IDA, mitral regurgitation, diastolic dysfunction, PAF.   Previously evaluated Dr. Charls in 2018 with echocardiogram normal LVEF, mild MR.  Cardiac catheterization greater than 20 years ago unremarkable.   Coronary CT 02/14/23 calcium  score of 1.22 placing her in 51st percentile fo rage/sex with RCA minima 1-24% calcified plaque proximally. Echo 02/14/23 LVFE 60-65%, moderate asymmetric LVH, gr1dd, RV moderately enlarged with normal function, moderate LAE, mild RAE, trivial MR.   At visit 02/03/2024 updated echocardiogram recommended.  Jardiance  10 mg daily added for diastolic dysfunction.  Echo 03/03/2024 LVEF 70 to 70%, no RWMA, mild LVH, grade 1 diastolic dysfunction, RV normal, mild LAE, moderate RAE, no significant valvular normalities.  Admitted 9/18 - 03/17/2024 to [facility] after presenting with near syncope and severe hip/leg pain.  She had near syncope while at [facility] appointment and was presented to the ED.  Labs with AKI, hyponatremia, hypokalemia.  EKG unremarkable.  In ED unresponsive for a few minutes with low BP high heart rate, regained consciousness with no deficit, CT head negative and neurology with no recommendation for further workup.  Suspected vagal syncope due to pain/anxiety.  Echo during admission LVEF 55%, moderate LVH, gr1dd, no significant valvular dysfunction.  Diltiazem , metoprolol , hydralazine continued. Olmesartan -hydrochlorothiazide  was discontinued due to hyponatremia.  Gabapentin 300 nightly started.  BuSpar initiated for anxiety.  NA 130 on discharge.  Plan was for outpatient epidural for pain management. She was  discharged to [facility].  At visit 04/09/24 she had returned from SNF to home the day prior. There was confusion regarding her medication regimen. Resuming Jardiance  deferred due to prior hyponatremia. DOE/fatigue deemed multifactorial deconditioning, asthma, diastolic dsyfunction.  Due to LE edema Lasix increased to 40mg  BID x 2 days then 40mg  daily thereafter.   She saw pulmonology 05/31/24 with plans for new CPAP.   She contacted the office 06/08/24 noting having been admitted to [facility] in [place], [place] for DOE. She was discharged on oxygen.   Scheduled for colonoscopy 06/22/24. Plans for iron  infusions with hematology after colonoscopy.   Presents today for follow up. Has not had HH PT in the last 2 weeks. They did come back yesterday to re-certify for a nurse once per week and hopefully resume PT as well. Reports her breathing is much better her swelling in her legs is improved from previous. She takes Lasix 40mg  at 10am then again between 5-6pm. Not bothered by nocturia.   Reports she had episode of atrial fibrillation in the hospital and [facility] was considered. Her atrial fib was associated with chest pain, dyspnea note with palpitations. Unfortunately I have her after visit instructions but not hospital notes. Her Diltiazem  and Hydralazine were stopped. There was also question of PNA per her report and she received abx and prednisone .   ROS: Please see the history of present illness.    All other systems reviewed and are negative.   Studies Reviewed: .              Cardiac Studies & Procedures   ______________________________________________________________________________________________     ECHOCARDIOGRAM  ECHOCARDIOGRAM COMPLETE 03/03/2024  Narrative ECHOCARDIOGRAM  REPORT    Patient Name:   DELENE MORAIS Date of Exam: 03/03/2024 Medical Rec #:  [id number]     Height:       65.0 in Accession #:    [id number]    Weight:       295.0 lb Date of Birth:  1950/11/27       BSA:          2.333 m Patient Age:    73 years      BP:           112/68 mmHg Patient Gender: F             HR:           58 bpm. Exam Location:  Outpatient  Procedure: 2D Echo, 3D Echo, Color Doppler, Cardiac Doppler and Strain Analysis (Both Spectral and Color Flow Doppler were utilized during procedure).  Indications:    Dyspnea  History:        Patient has prior history of Echocardiogram examinations, most recent 02/14/2023. TIA, Signs/Symptoms:Murmur; Risk Factors:Non-Smoker. Mitral regurgitation.  Sonographer:    Orvil Holmes RDCS Referring Phys: [id number] Nema Oatley S Icie Kuznicki  IMPRESSIONS   1. Left ventricular ejection fraction, by estimation, is 70 to 75%. Left ventricular ejection fraction by PLAX is 77 %. The left ventricle has hyperdynamic function. The left ventricle has no regional wall motion abnormalities. There is mild concentric left ventricular hypertrophy. Left ventricular diastolic parameters are consistent with Grade I diastolic dysfunction (impaired relaxation). 2. Right ventricular systolic function is normal. The right ventricular size is normal. 3. Left atrial size was mildly dilated. 4. Right atrial size was moderately dilated. 5. The mitral valve is normal in structure. No evidence of mitral valve regurgitation. No evidence of mitral stenosis. 6. The aortic valve is normal in structure. Aortic valve regurgitation is not visualized. No aortic stenosis is present. 7. The inferior vena cava is normal in size with greater than 50% respiratory variability, suggesting right atrial pressure of 3 mmHg.  FINDINGS Left Ventricle: Left ventricular ejection fraction, by estimation, is 70 to 75%. Left ventricular ejection fraction by PLAX is 77 %. The left ventricle has hyperdynamic function. The left ventricle has no regional wall motion abnormalities. Global longitudinal strain performed but not reported based on interpreter judgement due to suboptimal tracking. 3D  ejection fraction reviewed and evaluated as part of the interpretation. Alternate measurement of EF is felt to be most reflective of LV function. The left ventricular internal cavity size was normal in size. There is mild concentric left ventricular hypertrophy. Left ventricular diastolic parameters are consistent with Grade I diastolic dysfunction (impaired relaxation). Indeterminate filling pressures.  Right Ventricle: The right ventricular size is normal. No increase in right ventricular wall thickness. Right ventricular systolic function is normal.  Left Atrium: Left atrial size was mildly dilated.  Right Atrium: Right atrial size was moderately dilated.  Pericardium: There is no evidence of pericardial effusion.  Mitral Valve: The mitral valve is normal in structure. No evidence of mitral valve regurgitation. No evidence of mitral valve stenosis.  Tricuspid Valve: The tricuspid valve is normal in structure. Tricuspid valve regurgitation is not demonstrated. No evidence of tricuspid stenosis.  Aortic Valve: The aortic valve is normal in structure. Aortic valve regurgitation is not visualized. No aortic stenosis is present.  Pulmonic Valve: The pulmonic valve was normal in structure. Pulmonic valve regurgitation is not visualized. No evidence of pulmonic stenosis.  Aorta: The aortic root is normal in size and  structure.  Venous: The inferior vena cava is normal in size with greater than 50% respiratory variability, suggesting right atrial pressure of 3 mmHg.  IAS/Shunts: No atrial level shunt detected by color flow Doppler.   LEFT VENTRICLE PLAX 2D LV EF:         Left            Diastology ventricular     LV e' medial:    5.98 cm/s ejection        LV E/e' medial:  12.3 fraction by     LV e' lateral:   6.31 cm/s PLAX is 77      LV E/e' lateral: 11.6 %. LVIDd:         5.33 cm         2D Longitudinal LVIDs:         2.89 cm         Strain LV PW:         1.18 cm         2D Strain  GLS   -14.8 % LV IVS:        1.12 cm         (A4C): LVOT diam:     2.00 cm         2D Strain GLS   -12.6 % LV SV:         62              (A3C): LV SV Index:   27              2D Strain GLS   -20.3 % LVOT Area:     3.14 cm        (A2C): 2D Strain GLS   -15.0 % Avg:  3D Volume EF: 3D EF:        43 % LV EDV:       237 ml LV ESV:       134 ml LV SV:        103 ml  RIGHT VENTRICLE RV Basal diam:  5.10 cm RV Mid diam:    3.82 cm RV S prime:     14.60 cm/s TAPSE (M-mode): 3.1 cm  LEFT ATRIUM             Index        RIGHT ATRIUM           Index LA diam:        4.20 cm 1.80 cm/m   RA Area:     25.30 cm LA Vol (A2C):   78.2 ml 33.51 ml/m  RA Volume:   85.20 ml  36.51 ml/m LA Vol (A4C):   49.9 ml 21.38 ml/m LA Biplane Vol: 67.8 ml 29.06 ml/m AORTIC VALVE LVOT Vmax:   88.60 cm/s LVOT Vmean:  55.500 cm/s LVOT VTI:    0.197 m  AORTA Ao Root diam: 3.20 cm Ao Asc diam:  3.10 cm  MITRAL VALVE MV Area (PHT): 3.08 cm     SHUNTS MV Decel Time: 246 msec     Systemic VTI:  0.20 m MV E velocity: 73.40 cm/s   Systemic Diam: 2.00 cm MV A velocity: 108.00 cm/s MV E/A ratio:  0.68  Annabella Scarce MD Electronically signed by Annabella Scarce MD Signature Date/Time: 03/03/2024/3:05:19 PM    Final      CT SCANS  CT CORONARY MORPH W/CTA COR W/SCORE 02/14/2023  Addendum 02/21/2023  5:30 PM ADDENDUM REPORT: 02/21/2023 17:28  EXAM: OVER-READ  INTERPRETATION  CT CHEST  The following report is an over-read performed by radiologist Dr. Fonda Mom [facility] on 02/21/2023. This over-read does not include interpretation of cardiac or coronary anatomy or pathology. The coronary CTA interpretation by the cardiologist is attached.  COMPARISON:  01/16/2017.  FINDINGS: Cardiovascular: Cardiomegaly. See findings discussed in the body of the report.  Mediastinum/Nodes: No suspicious adenopathy identified. Imaged mediastinal structures are  unremarkable.  Lungs/Pleura: There is dependent basilar subsegmental atelectasis. No pneumonia or pulmonary edema. No pleural effusion or pneumothorax.  Upper Abdomen: No acute abnormality.  Musculoskeletal: No chest wall abnormality. There are thoracic degenerative changes.  IMPRESSION: No acute extracardiac incidental findings identified.   Electronically Signed By: Fonda Field M.D. On: 02/21/2023 17:28  Narrative CLINICAL DATA:  Chest pain  EXAM: Cardiac CTA  MEDICATIONS: Sub lingual nitro. 4mg  and lopressor  50mg   TECHNIQUE: The patient was scanned on a Siemens Force 192 slice scanner. Gantry rotation speed was 250 msecs. Collimation was .6 mm. A 100 kV prospective scan was triggered in the ascending thoracic aorta at 140 HU's Full mA was used between 35% and 75% of the R-R interval. Average HR during the scan was 66 bpm. The 3D data set was interpreted on a dedicated work station using MPR, MIP and VRT modes. A total of 80 cc of contrast was used.  FINDINGS: Non-cardiac: See separate report from [facility]. No significant findings on limited lung and soft tissue windows.  Calcium  Score: Minimal calcium  noted in RCA  Coronary Arteries: Right dominant with no anomalies  LM: Normal  LAD: Normal  D1: Normal  D2: Normal  Circumflex: Normal  OM1: Tortuous normal  OM2: Normal  RCA: 1-24% calcified plaque proximally  PDA: Normal  PLA: Normal  Normal ascending thoracic aorta 3.0 cm Dilated main PA 4.0 cm No evidence of PFO/ASD/VSD Pulmonary vein drainage also appears normal  IMPRESSION: 1. Calcium  score 1.22 which is 51 st percentile for age /sex  2. Poor quality study due to patient size and presumed some degree of pulmonary HTN with dilated main PA 4.0 cm  3.  CAD RADS 1 non obstructive CAD see description above  4.  Normal ascending thoracic aorta 3.0 cm  5. No obvious shunting in Atrial septum, ventricular septum and normal  PV anatomy as visualized  Maude Emmer  Electronically Signed: By: Maude Emmer M.D. On: 02/14/2023 15:40     ______________________________________________________________________________________________      Risk Assessment/Calculations:             Physical Exam:   VS:  BP 126/84 (BP Location: Left Arm, Patient Position: Sitting, Cuff Size: Large)   Pulse (!) 58   Ht 5' 5 (1.651 m)   Wt (!) 309 lb (140.2 kg)   SpO2 93%   BMI 51.42 kg/m    Wt Readings from Last 3 Encounters:  06/11/24 (!) 309 lb (140.2 kg)  06/09/24 (!) 312 lb (141.5 kg)  03/01/24 295 lb (133.8 kg)    GEN: Well nourished, overweight, well developed in no acute distress NECK: No JVD; No carotid bruits CARDIAC: RRR, no murmurs, rubs, gallops RESPIRATORY:  Clear to auscultation without rales, wheezing or rhonchi  ABDOMEN: Soft, non-tender, non-distended EXTREMITIES:  No edema; No deformity   ASSESSMENT AND PLAN: .    Asthma / OSA - follows with Dr. Shellia of pulmonology. CPAP compliance encouraged. Now requiring 1-2L home O2.  Nonobstructive CAD / Aortic atherosclerosis / Hx of TIA / HLD, LDL goal <70- 01/2023  CCTA revealed RCA with 1-24% stenosis. No angina. GDMT atorvastatin  10mg  daily, plavix 75mg  daily, jardince 10mg  daily, toprol  25mg  daily.   PAF - Reports episode during her most recent hospitalization. Records not available for my review, will request. Upcoming colonoscopy 06/22/24 to evaluate cause of anemia and has iron  infusions upcoming for IDA. At this time risk of OAC outweighs benefit. RRR by auscultation today.  14 day monitor placed in clinic to assess AFib burden Re-discuss OAC at follow up   Diastolic heart failure -  Euvolemic on exam, limited by body habitus.  Continue Lasix 40mg  BID, Jardiance  10mg  daily, Toprol  25mg  daily. Refills provided. Recommend fluid restriction <2L Consider Kerendia vs Spironolactone at follow up.   OSA - CPAP compliance encouraged.  Endorses wearing  regularly.   HTN- BP reasonably controlled. Continue Lasix 40 mg BID, Valsartan 80mg  daily, Toprol  25 mg twice daily. Discussed to monitor BP at home at least 2 hours after medications and sitting for 5-10 minutes.   BMI 51 / Morbid Obesity - Weight loss via diet and exercise encouraged. Discussed the impact being overweight would have on cardiovascular risk. A1c previously prediabetic.  [place]  prior Auth obtained 02/04/2024 however cost prohibitive with $608 per month.  IDA-follows with Dr. Sherrod of hematology.  TIA - hx of TIA 2010. Lipid and BP management, as above. Continue Clopidogrel 75mg  daily.        Dispo: follow up in 1 month   Signed, Reche GORMAN Finder, NP   "

## 2024-06-14 ENCOUNTER — Ambulatory Visit

## 2024-06-14 VITALS — BP 122/64 | HR 61 | Temp 97.9°F | Resp 20

## 2024-06-14 DIAGNOSIS — J4550 Severe persistent asthma, uncomplicated: Secondary | ICD-10-CM | POA: Diagnosis present

## 2024-06-14 DIAGNOSIS — D500 Iron deficiency anemia secondary to blood loss (chronic): Secondary | ICD-10-CM | POA: Insufficient documentation

## 2024-06-14 MED ORDER — LORATADINE 10 MG PO TABS
10.0000 mg | ORAL_TABLET | Freq: Once | ORAL | Status: AC
  Administered 2024-06-14: 10 mg via ORAL

## 2024-06-14 MED ORDER — ACETAMINOPHEN 325 MG PO TABS
650.0000 mg | ORAL_TABLET | Freq: Once | ORAL | Status: AC
  Administered 2024-06-14: 650 mg via ORAL

## 2024-06-14 MED ORDER — SODIUM CHLORIDE 0.9 % IV SOLN
510.0000 mg | Freq: Once | INTRAVENOUS | Status: AC
  Administered 2024-06-14: 510 mg via INTRAVENOUS
  Filled 2024-06-14: qty 17

## 2024-06-14 NOTE — Progress Notes (Signed)
 Diagnosis:  Iron  Deficiency Anemia  Provider:  Sherrod Sherrod COME  Procedure: IV Infusion  IV Type: Peripheral, IV Location: L Antecubital   Feraheme  (Ferumoxytol ), Dose: 510 mg  Infusion Start Time: 1524  Infusion Stop Time: 1545  Post Infusion IV Care: Observation period completed  Discharge: Condition: Good, Destination: Home . AVS Declined  Performed by:  Armonie Mettler R, LPN

## 2024-06-14 NOTE — Progress Notes (Signed)
 Addendum 06/14/24:  Echo 06/05/24 at [facility] -LV cavity normal size, LV wall thickness severely increased, LVEF 65-70%, gr1dd -mild AS mean grandietn 14 mmhg -mild MR -LA and RA mildly dilated -RV mildly enlarged, normal RVSF   EKGs which were sent with records were both NSR.   Lacharles Altschuler S Rajeev Escue, NP

## 2024-06-18 ENCOUNTER — Telehealth (HOSPITAL_BASED_OUTPATIENT_CLINIC_OR_DEPARTMENT_OTHER): Payer: Self-pay | Admitting: Cardiovascular Disease

## 2024-06-18 NOTE — Telephone Encounter (Signed)
 Patient is concerned her heart monitor is blinking red and wants advice on next steps.

## 2024-06-21 ENCOUNTER — Encounter: Admitting: *Deleted

## 2024-06-21 ENCOUNTER — Ambulatory Visit

## 2024-06-21 VITALS — BP 133/96 | HR 72 | Temp 98.2°F | Resp 20

## 2024-06-21 VITALS — BP 126/55 | HR 60 | Temp 97.5°F | Resp 18

## 2024-06-21 DIAGNOSIS — J4550 Severe persistent asthma, uncomplicated: Secondary | ICD-10-CM

## 2024-06-21 DIAGNOSIS — D500 Iron deficiency anemia secondary to blood loss (chronic): Secondary | ICD-10-CM

## 2024-06-21 MED ORDER — LORATADINE 10 MG PO TABS
10.0000 mg | ORAL_TABLET | Freq: Every day | ORAL | Status: AC
  Administered 2024-06-21: 10 mg via ORAL

## 2024-06-21 MED ORDER — ACETAMINOPHEN 325 MG PO TABS
650.0000 mg | ORAL_TABLET | Freq: Once | ORAL | Status: AC
  Administered 2024-06-21: 650 mg via ORAL

## 2024-06-21 MED ORDER — SODIUM CHLORIDE 0.9 % IV SOLN
510.0000 mg | Freq: Once | INTRAVENOUS | Status: AC
  Administered 2024-06-21: 510 mg via INTRAVENOUS
  Filled 2024-06-21: qty 17

## 2024-06-21 MED ORDER — BENRALIZUMAB 30 MG/ML ~~LOC~~ SOSY: 30.0000 mg | PREFILLED_SYRINGE | Freq: Once | SUBCUTANEOUS | Status: DC

## 2024-06-21 MED ORDER — BENRALIZUMAB 30 MG/ML ~~LOC~~ SOSY
30.0000 mg | PREFILLED_SYRINGE | Freq: Once | SUBCUTANEOUS | Status: AC
  Administered 2024-06-21: 30 mg via SUBCUTANEOUS
  Filled 2024-06-21: qty 1

## 2024-06-21 NOTE — Progress Notes (Signed)
 Diagnosis: Asthma  Provider:  Harden Staff, MD  Procedure: Injection  Fasenra  (Benralizumab ), Dose: 30 mg, Site: subcutaneous, Number of injections: 1  Injection Site(s): Left arm  Post Care: Observation period completed  Discharge: Condition: Good, Destination: Home . AVS Declined  Performed by:  Baldwin Darice Helling, RN

## 2024-06-21 NOTE — Progress Notes (Signed)
 Diagnosis: Iron  Deficiency Anemia  Provider:  Sherrod Sherrod, MD  Procedure: IV Infusion  IV Type: Peripheral, IV Location: L Antecubital  Feraheme  (Ferumoxytol ), Dose: 510 mg  Infusion Start Time: 1106  Infusion Stop Time: 1122  Post Infusion IV Care: Observation period completed  Discharge: Condition: Good, Destination: Home . AVS Declined  Performed by:  Baldwin Darice Helling, RN

## 2024-07-01 ENCOUNTER — Telehealth: Payer: Self-pay

## 2024-07-01 DIAGNOSIS — I480 Paroxysmal atrial fibrillation: Secondary | ICD-10-CM

## 2024-07-01 NOTE — Telephone Encounter (Signed)
 Auth Submission: NO AUTH NEEDED Site of care: Site of care: [facility] Payer: medicare a/b, [employer] [place] supp Medication & CPT/J Code(s) submitted: Fasenra  (Benralizumab ) [id number] Diagnosis Code:  Route of submission (phone, fax, portal): portal Phone # Fax # Auth type: Buy/Bill HB Units/visits requested: 30mg  [id number] Reference number:  Approval from: 07/01/24 to 06/23/25

## 2024-07-02 ENCOUNTER — Ambulatory Visit (HOSPITAL_BASED_OUTPATIENT_CLINIC_OR_DEPARTMENT_OTHER): Payer: Self-pay | Admitting: Family

## 2024-07-04 ENCOUNTER — Encounter: Payer: Self-pay | Admitting: Internal Medicine

## 2024-07-05 ENCOUNTER — Telehealth: Payer: Self-pay | Admitting: Medical Oncology

## 2024-07-05 NOTE — Telephone Encounter (Signed)
 Called patient regarding MyChart message reporting bright red blood per rectum with bowel movements  since December.   She reports blood dripping into the toilet water as well as visible blood on toilet paper after wiping. Patient endorses feeling weaker with decreased energy levels. She ambulates with a rollator, which she has been using for some time.  Denies dizziness or lightheadedness.  Patient reports a history of hemorrhoids in October and used Preparation H at that time, stating she did not have these symptoms at that time.   Patient has an appointment scheduled with her PCP tomorrow.   She was instructed to discuss her symptoms with her PCP.

## 2024-07-06 ENCOUNTER — Telehealth: Payer: Self-pay | Admitting: Medical Oncology

## 2024-07-06 NOTE — Telephone Encounter (Signed)
 LVM to return my call and that Dr. Sherrod concurred for her to see PCP .

## 2024-07-20 ENCOUNTER — Encounter (HOSPITAL_BASED_OUTPATIENT_CLINIC_OR_DEPARTMENT_OTHER): Payer: Self-pay

## 2024-07-20 ENCOUNTER — Ambulatory Visit (INDEPENDENT_AMBULATORY_CARE_PROVIDER_SITE_OTHER): Admitting: Family

## 2024-07-20 ENCOUNTER — Encounter (HOSPITAL_BASED_OUTPATIENT_CLINIC_OR_DEPARTMENT_OTHER): Payer: Self-pay | Admitting: Family

## 2024-07-20 VITALS — BP 142/78 | HR 82 | Ht 65.0 in | Wt 323.5 lb

## 2024-07-20 DIAGNOSIS — I25118 Atherosclerotic heart disease of native coronary artery with other forms of angina pectoris: Secondary | ICD-10-CM

## 2024-07-20 DIAGNOSIS — G4733 Obstructive sleep apnea (adult) (pediatric): Secondary | ICD-10-CM

## 2024-07-20 DIAGNOSIS — I5032 Chronic diastolic (congestive) heart failure: Secondary | ICD-10-CM

## 2024-07-20 DIAGNOSIS — E785 Hyperlipidemia, unspecified: Secondary | ICD-10-CM | POA: Diagnosis not present

## 2024-07-20 DIAGNOSIS — I10 Essential (primary) hypertension: Secondary | ICD-10-CM

## 2024-07-20 DIAGNOSIS — R5381 Other malaise: Secondary | ICD-10-CM | POA: Diagnosis not present

## 2024-07-20 DIAGNOSIS — I480 Paroxysmal atrial fibrillation: Secondary | ICD-10-CM

## 2024-07-20 MED ORDER — FUROSCIX 80 MG/10ML ~~LOC~~ CTKT: CARTRIDGE | SUBCUTANEOUS | 0 refills | Status: AC

## 2024-07-20 MED ORDER — FUROSCIX 80 MG/10ML ~~LOC~~ CTKT
80.0000 mg | CARTRIDGE | SUBCUTANEOUS | 0 refills | Status: AC
  Filled 2024-07-23: qty 2, fill #0

## 2024-07-20 NOTE — Patient Instructions (Addendum)
 Medication Instructions:  USE THE FUROSCIX  TOMORROW   THURSDAY AND FRIDAY TAKE LASIX  (FUROSEMIDE ) 2 TABLETS IN THE MORNING AND 1 TABLET IN THE EVENING ON SATURDAY START BACK ON LASIX  1 TABLET TWICE A DAY   Labwork: NONE  Testing/Procedures: NONE   Follow-Up: 4 TO 6 WEEKS WITH DR Denver OR CAITLIN W NP   You have been referred to HEART FAILURE CLINIC  IF YOU GET IN WITH THEM BEFORE YOUR APPOINTMENT WITH CAITLIN OR DR Axtell LET US  KNOW AND WILL PUSH OUT   You have been referred to PHYSICAL THERAPY  IF YOU DO NOT HEAR IN 2 WEEKS CALL THE OFFICE   Any Other Special Instructions Will Be Listed Below (If Applicable).     If you need a refill on your cardiac medications before your next appointment, please call your pharmacy.   If your oxygen level is below 85%, I would turn your oxygen up to 4L then when oxygen is back up to >92%, okay turn it back down to 2L  Pursed Lip Breathing Pursed lip breathing is a technique to relieve the feeling of being short of breath.  Being short of breath can make you tense and anxious. Before you start this breathing exercise, take a minute to relax your shoulders and close your eyes. Then: Start the exercise by closing your mouth. Breathe in through your nose, taking a normal breath. You can do this at your normal rate of breathing. If you feel you are not getting enough air, breathe in while slowly counting to 2 or 3. Pucker (purse) your lips as if you were going to whistle. Gently tighten the muscles of your abdomenor press on your abdomen to help push the air out. Breathe out slowly through your pursed lips. Take at least twice as long to breathe out as it takes you to breathe in. Make sure that you breathe out all of the air, but do not force air out.

## 2024-07-20 NOTE — Progress Notes (Signed)
 " Cardiology Office Note:  .   Date:  07/20/2024  ID:  Tamara Daugherty, Tamara Daugherty 11-23-1950, MRN [id number] PCP: Graydon Mt, MD  [facility] Providers Cardiologist:  Annabella Scarce, MD    History of Present Illness: Tamara   Tamara Daugherty is a 74 y.o. female  with a hx of asthma, GERD, TIA, hypothyroidism, hypertension, sleep apnea on CPAP, obesity, IDA, mitral regurgitation, diastolic dysfunction, PAF.   Previously evaluated Dr. Charls in 2018 with echocardiogram normal LVEF, mild MR.  Cardiac catheterization greater than 20 years ago unremarkable.   Coronary CT 02/14/23 calcium  score of 1.22 placing her in 51st percentile fo rage/sex with RCA minima 1-24% calcified plaque proximally. Echo 02/14/23 LVFE 60-65%, moderate asymmetric LVH, gr1dd, RV moderately enlarged with normal function, moderate LAE, mild RAE, trivial MR.   At visit 02/03/2024 updated echocardiogram recommended.  Jardiance  10 mg daily added for diastolic dysfunction.  Echo 03/03/2024 LVEF 70 to 70%, no RWMA, mild LVH, grade 1 diastolic dysfunction, RV normal, mild LAE, moderate RAE, no significant valvular normalities.  Admitted 9/18 - 03/17/2024 to [facility] after presenting with near syncope and severe hip/leg pain.  She had near syncope while at brain spinal group appointment and was presented to the ED.  Labs with AKI, hyponatremia, hypokalemia.  EKG unremarkable.  In ED unresponsive for a few minutes with low BP high heart rate, regained consciousness with no deficit, CT head negative and neurology with no recommendation for further workup.  Suspected vagal syncope due to pain/anxiety.  Echo during admission LVEF 55%, moderate LVH, gr1dd, no significant valvular dysfunction.  Diltiazem , metoprolol , hydralazine continued. Olmesartan -hydrochlorothiazide  was discontinued due to hyponatremia.  Gabapentin 300 nightly started.  BuSpar initiated for anxiety.  NA 130 on discharge.  Plan was for outpatient epidural for pain management. She was  discharged to [facility].  At visit 04/09/24 she had returned from SNF to home the day prior. There was confusion regarding her medication regimen. Resuming Jardiance  deferred due to prior hyponatremia. DOE/fatigue deemed multifactorial deconditioning, asthma, diastolic dsyfunction.  Due to LE edema Lasix  increased to 40mg  BID x 2 days then 40mg  daily thereafter.   She saw pulmonology 05/31/24 with plans for new CPAP.   She contacted the office 06/08/24 noting having been admitted to [facility] in [place], [place] for DOE. She was discharged on oxygen.   Scheduled for colonoscopy 06/22/24. Plans for iron  infusions with hematology after colonoscopy.   Last seen 06/11/24. Was planning to resume HH PT. She was taking Lasix  40mg  at 10am then again between 5-6pm. Not bothered by nocturia. Reported afib at hospital, OAC not initiated, hospital notes not available for review. Diltiazem  and Hydralazine were stopped during hospitalization.  Subsequent monitor with 3 brief episodes of atrial fibrillation lasting at most 16 minutes. Overall occurring <1% of the time.Advised to continue Metoprolol  at present dose. Given upcoming iron  infusion, colonoscopy, known hemorrhoids OAC deferred as risk outweighed benefit.     Provider note only: will discuss OAC at follow up. She had recent colonoscopy (results not available for my revew) and iron  infusion and at PCP visit yesterday, known hemorrhoids. With brief episodes, risk of OAC at this time outweigh benefit.    Updated labs 07/06/24 rbc 3.51, Hb 10.5 hct 35.4, plt 251, Ferritin 58.2, iron  46 (low), TIBC 312, K 3.9, creatinine 0.71, GFR 90, CO2 >40  Presents today for follow up with her husband. Weight has increased from 309 ? 323 lbs in 6 weeks. She weighs sporadically  at home has has difficulty standing safely on her scale. Notes increase dyspnea over the past couple weeks. Also notes her O2, heart rate have been jumping around. Oxygen often in the 80s  even with her home O2 that does improve to low 90s with deep breathing. She has had oxygen levels as low as in the 70s. She does have adjustable bed to raise the head of her bed. This morning reports it took her 20 minutes to get from oxygen level in the 70s to the 90s. She is wearing her CPAP at night with her oxygen hooked to it. She reports adhering to 3 bottles of fluid per day though husband notes   4 polyps removed during her colonoscopy which were benign. No evidence of significant bleeding. There was concern for AVM as well as internal hemorrhoids. Still occasional rectal bleeding. Since last seen she has completed 2 iron  infusions.   ROS: Please see the history of present illness.    All other systems reviewed and are negative.   Studies Reviewed: .              Cardiac Studies & Procedures   ______________________________________________________________________________________________     ECHOCARDIOGRAM  ECHOCARDIOGRAM COMPLETE 03/03/2024  Narrative ECHOCARDIOGRAM REPORT    Patient Name:   Tamara Daugherty Date of Exam: 03/03/2024 Medical Rec #:  [id number]     Height:       65.0 in Accession #:    [id number]    Weight:       295.0 lb Date of Birth:  10/06/1950      BSA:          2.333 m Patient Age:    73 years      BP:           112/68 mmHg Patient Gender: F             HR:           58 bpm. Exam Location:  Outpatient  Procedure: 2D Echo, 3D Echo, Color Doppler, Cardiac Doppler and Strain Analysis (Both Spectral and Color Flow Doppler were utilized during procedure).  Indications:    Dyspnea  History:        Patient has prior history of Echocardiogram examinations, most recent 02/14/2023. TIA, Signs/Symptoms:Murmur; Risk Factors:Non-Smoker. Mitral regurgitation.  Sonographer:    Orvil Holmes RDCS Referring Phys: [id number] Tamara Daugherty  IMPRESSIONS   1. Left ventricular ejection fraction, by estimation, is 70 to 75%. Left ventricular ejection fraction by PLAX  is 77 %. The left ventricle has hyperdynamic function. The left ventricle has no regional wall motion abnormalities. There is mild concentric left ventricular hypertrophy. Left ventricular diastolic parameters are consistent with Grade I diastolic dysfunction (impaired relaxation). 2. Right ventricular systolic function is normal. The right ventricular size is normal. 3. Left atrial size was mildly dilated. 4. Right atrial size was moderately dilated. 5. The mitral valve is normal in structure. No evidence of mitral valve regurgitation. No evidence of mitral stenosis. 6. The aortic valve is normal in structure. Aortic valve regurgitation is not visualized. No aortic stenosis is present. 7. The inferior vena cava is normal in size with greater than 50% respiratory variability, suggesting right atrial pressure of 3 mmHg.  FINDINGS Left Ventricle: Left ventricular ejection fraction, by estimation, is 70 to 75%. Left ventricular ejection fraction by PLAX is 77 %. The left ventricle has hyperdynamic function. The left ventricle has no regional wall motion abnormalities. Global longitudinal strain performed but  not reported based on interpreter judgement due to suboptimal tracking. 3D ejection fraction reviewed and evaluated as part of the interpretation. Alternate measurement of EF is felt to be most reflective of LV function. The left ventricular internal cavity size was normal in size. There is mild concentric left ventricular hypertrophy. Left ventricular diastolic parameters are consistent with Grade I diastolic dysfunction (impaired relaxation). Indeterminate filling pressures.  Right Ventricle: The right ventricular size is normal. No increase in right ventricular wall thickness. Right ventricular systolic function is normal.  Left Atrium: Left atrial size was mildly dilated.  Right Atrium: Right atrial size was moderately dilated.  Pericardium: There is no evidence of pericardial  effusion.  Mitral Valve: The mitral valve is normal in structure. No evidence of mitral valve regurgitation. No evidence of mitral valve stenosis.  Tricuspid Valve: The tricuspid valve is normal in structure. Tricuspid valve regurgitation is not demonstrated. No evidence of tricuspid stenosis.  Aortic Valve: The aortic valve is normal in structure. Aortic valve regurgitation is not visualized. No aortic stenosis is present.  Pulmonic Valve: The pulmonic valve was normal in structure. Pulmonic valve regurgitation is not visualized. No evidence of pulmonic stenosis.  Aorta: The aortic root is normal in size and structure.  Venous: The inferior vena cava is normal in size with greater than 50% respiratory variability, suggesting right atrial pressure of 3 mmHg.  IAS/Shunts: No atrial level shunt detected by color flow Doppler.   LEFT VENTRICLE PLAX 2D LV EF:         Left            Diastology ventricular     LV e' medial:    5.98 cm/s ejection        LV E/e' medial:  12.3 fraction by     LV e' lateral:   6.31 cm/s PLAX is 77      LV E/e' lateral: 11.6 %. LVIDd:         5.33 cm         2D Longitudinal LVIDs:         2.89 cm         Strain LV PW:         1.18 cm         2D Strain GLS   -14.8 % LV IVS:        1.12 cm         (A4C): LVOT diam:     2.00 cm         2D Strain GLS   -12.6 % LV SV:         62              (A3C): LV SV Index:   27              2D Strain GLS   -20.3 % LVOT Area:     3.14 cm        (A2C): 2D Strain GLS   -15.0 % Avg:  3D Volume EF: 3D EF:        43 % LV EDV:       237 ml LV ESV:       134 ml LV SV:        103 ml  RIGHT VENTRICLE RV Basal diam:  5.10 cm RV Mid diam:    3.82 cm RV S prime:     14.60 cm/s TAPSE (M-mode): 3.1 cm  LEFT ATRIUM  Index        RIGHT ATRIUM           Index LA diam:        4.20 cm 1.80 cm/m   RA Area:     25.30 cm LA Vol (A2C):   78.2 ml 33.51 ml/m  RA Volume:   85.20 ml  36.51 ml/m LA Vol (A4C):   49.9 ml  21.38 ml/m LA Biplane Vol: 67.8 ml 29.06 ml/m AORTIC VALVE LVOT Vmax:   88.60 cm/s LVOT Vmean:  55.500 cm/s LVOT VTI:    0.197 m  AORTA Ao Root diam: 3.20 cm Ao Asc diam:  3.10 cm  MITRAL VALVE MV Area (PHT): 3.08 cm     SHUNTS MV Decel Time: 246 msec     Systemic VTI:  0.20 m MV E velocity: 73.40 cm/s   Systemic Diam: 2.00 cm MV A velocity: 108.00 cm/s MV E/A ratio:  0.68  Annabella Scarce MD Electronically signed by Annabella Scarce MD Signature Date/Time: 03/03/2024/3:05:19 PM    Final    MONITORS  LONG TERM MONITOR (3-14 DAYS) 06/30/2024  Narrative 7 Day Zio Monitor  Quality: Fair.  Baseline artifact. Predominant rhythm:sinus rhythm Rare (<1%) atrial fibrillation.  Longest episode 16 minutes. Average heart rate: 62 bpm Max heart rate: 92 bpm Min heart rate: 50 bpm Pauses >2.5 seconds: none  Rare (<1%) PACs and occasional (1.1%) PVCs  17 episodes of SVT.  Max rate 152 bpm.  Longest episode 22.6 seconds.  Tiffany C. Scarce, MD, [facility] 07/01/2024 10:58 PM   CT SCANS  CT CORONARY MORPH W/CTA COR W/SCORE 02/14/2023  Addendum 02/21/2023  5:30 PM ADDENDUM REPORT: 02/21/2023 17:28  EXAM: OVER-READ INTERPRETATION  CT CHEST  The following report is an over-read performed by radiologist Dr. Fonda Mom [facility] on 02/21/2023. This over-read does not include interpretation of cardiac or coronary anatomy or pathology. The coronary CTA interpretation by the cardiologist is attached.  COMPARISON:  01/16/2017.  FINDINGS: Cardiovascular: Cardiomegaly. See findings discussed in the body of the report.  Mediastinum/Nodes: No suspicious adenopathy identified. Imaged mediastinal structures are unremarkable.  Lungs/Pleura: There is dependent basilar subsegmental atelectasis. No pneumonia or pulmonary edema. No pleural effusion or pneumothorax.  Upper Abdomen: No acute abnormality.  Musculoskeletal: No chest wall abnormality. There are  thoracic degenerative changes.  IMPRESSION: No acute extracardiac incidental findings identified.   Electronically Signed By: Fonda Field M.D. On: 02/21/2023 17:28  Narrative CLINICAL DATA:  Chest pain  EXAM: Cardiac CTA  MEDICATIONS: Sub lingual nitro. 4mg  and lopressor  50mg   TECHNIQUE: The patient was scanned on a Siemens Force 192 slice scanner. Gantry rotation speed was 250 msecs. Collimation was .6 mm. A 100 kV prospective scan was triggered in the ascending thoracic aorta at 140 HU's Full mA was used between 35% and 75% of the R-R interval. Average HR during the scan was 66 bpm. The 3D data set was interpreted on a dedicated work station using MPR, MIP and VRT modes. A total of 80 cc of contrast was used.  FINDINGS: Non-cardiac: See separate report from [facility]. No significant findings on limited lung and soft tissue windows.  Calcium  Score: Minimal calcium  noted in RCA  Coronary Arteries: Right dominant with no anomalies  LM: Normal  LAD: Normal  D1: Normal  D2: Normal  Circumflex: Normal  OM1: Tortuous normal  OM2: Normal  RCA: 1-24% calcified plaque proximally  PDA: Normal  PLA: Normal  Normal ascending thoracic aorta 3.0 cm Dilated main  PA 4.0 cm No evidence of PFO/ASD/VSD Pulmonary vein drainage also appears normal  IMPRESSION: 1. Calcium  score 1.22 which is 51 st percentile for age /sex  2. Poor quality study due to patient size and presumed some degree of pulmonary HTN with dilated main PA 4.0 cm  3.  CAD RADS 1 non obstructive CAD see description above  4.  Normal ascending thoracic aorta 3.0 cm  5. No obvious shunting in Atrial septum, ventricular septum and normal PV anatomy as visualized  Maude Emmer  Electronically Signed: By: Maude Emmer M.D. On: 02/14/2023 15:40     ______________________________________________________________________________________________        Risk  Assessment/Calculations:            Physical Exam:   VS:  BP (!) 142/78   Pulse 82   Ht 5' 5 (1.651 m)   Wt (!) 323 lb 8 oz (146.7 kg)   SpO2 94%   BMI 53.83 kg/m    Wt Readings from Last 3 Encounters:  07/20/24 (!) 323 lb 8 oz (146.7 kg)  06/11/24 (!) 309 lb (140.2 kg)  06/09/24 (!) 312 lb (141.5 kg)    GEN: Well nourished, overweight, well developed in no acute distress NECK: No JVD; No carotid bruits CARDIAC: RRR, no murmurs, rubs, gallops RESPIRATORY:  Clear to auscultation without rales, wheezing or rhonchi  ABDOMEN: Soft, non-tender, non-distended EXTREMITIES:  1+ pitting edema, compression stockings in place; No deformity   ASSESSMENT AND PLAN: .    Asthma / OSA - follows with Dr. Shellia of pulmonology. CPAP compliance encouraged. Now requiring 2L home O2. Discussed if O2 <85% to increase O2 to 4L temporarily.   Nonobstructive CAD / Aortic atherosclerosis / Hx of TIA / HLD, LDL goal <70- 01/2023 CCTA revealed RCA with 1-24% stenosis. No angina. GDMT atorvastatin  10mg  daily, plavix 75mg  daily, jardince 10mg  daily, toprol  25mg  daily.   PAF -Monitor 06/30/24 with <1% afib burden. Recent colonoscopy with 4 benign polyp, internal hemorrhoid, and ?AVM per her report. Has completed 2 iron  infusions. Formal report from GI not available. RRR by auscultation today. Reports continued intermittent rectal bleeding similar to previous. At this time, with infrequent afib risks of OAC outweigh benefit.   Diastolic heart failure -  Weight up, increased LE edema, increased dyspnea. Dose of Furoscix  SQ tomorrow (Wednesday), Thursday/Friday increased Lasix  80mg  AM/40mg  PM, Saturday return to usual 40mg  BID dosing. Phone call Monday to check in. Continue Jardiance  10mg  daily, Toprol  25mg  daily.  Her heart failure has been difficult to manage, refer to [facility]. Recommend fluid restriction <2L and low sodium diet. Consider Kerendia vs Spironolactone at follow up. Unable to  address today due to need to address volume status.  OSA - CPAP compliance encouraged.  Endorses wearing regularly. She is wearing with her oxygen.   HTN- BP elevated today in setting of volume overload. Present regimen Valsartan 80mg  daily, Toprol  25 mg twice daily. Continue same, reassess after correction of volume status.  Discussed to monitor BP at home at least 2 hours after medications and sitting for 5-10 minutes.   BMI 53 / Morbid Obesity - Weight loss via diet and exercise encouraged. Discussed the impact being overweight would have on cardiovascular risk. A1c previously prediabetic.  Wegovy  prior Auth obtained 02/04/2024 however cost prohibitive with $608 per month.  IDA-follows with Dr. Sherrod of hematology. Completed 2 iron  infusions. No plan for further iron  infusions per her report.   TIA - hx of TIA 2010. Lipid  and BP management, as above. Continue Clopidogrel 75mg  daily.        Dispo: follow up in 4-6 weeks with Dr. Raford or APP (If HF visit scheduled sooner, can reschedule general cardiology visit).  Signed, Reche GORMAN Finder, NP   "

## 2024-07-22 ENCOUNTER — Other Ambulatory Visit (HOSPITAL_COMMUNITY): Payer: Self-pay

## 2024-07-22 ENCOUNTER — Encounter: Payer: Self-pay | Admitting: Pulmonary Disease

## 2024-07-22 ENCOUNTER — Encounter: Payer: Self-pay | Admitting: Internal Medicine

## 2024-07-22 ENCOUNTER — Encounter (HOSPITAL_BASED_OUTPATIENT_CLINIC_OR_DEPARTMENT_OTHER): Payer: Self-pay

## 2024-07-22 ENCOUNTER — Other Ambulatory Visit: Payer: Self-pay

## 2024-07-23 ENCOUNTER — Other Ambulatory Visit: Payer: Self-pay

## 2024-07-26 ENCOUNTER — Telehealth (HOSPITAL_BASED_OUTPATIENT_CLINIC_OR_DEPARTMENT_OTHER): Payer: Self-pay | Admitting: *Deleted

## 2024-07-27 MED ORDER — METOLAZONE 2.5 MG PO TABS: ORAL_TABLET | ORAL | 1 refills | Status: AC

## 2024-07-27 NOTE — Telephone Encounter (Signed)
 Tamara Daugherty - synopsis for you with upcoming visit.   Hx of HFpEF, severe asthma, OSA, morbid obesity, IDA. Admission 05/2024 in [place] for HFpEF. New finding of PAF, subsequent monitor <1% PAF burden and OAC deferred due to ongoing rectal bleeding from hemorrhoids, recent colonoscopy result unavailable, having recently completed iron  infusions for IDA.  At follow up 07/20/24 weight up 14 lbs over 6 weeks (not weighing at home with limited mobility), increased dyspnea, hypoxia despite home O2. Given one time dose Furoscix , further refills cost prohibitive. I reached out to [employer] with HF team who suggested:   Start w/ referring her for outpatient RHC, if she is agreeable. If elevated filling pressures to suggest her symptoms are predominantly d/t HF, then you can either go higher on Lasix  vs switch to torsemide . Also consider use of metolazone  as an adjunct in pt w/ diuretic resistance.   Added Metolazone  2.5mg  weekly to her Lasix  40mg  BID and have her set to follow up with you in a couple weeks to reassess volume status, discuss possible RHC.   Tamara Faulks S Aero Drummonds, NP

## 2024-07-27 NOTE — Telephone Encounter (Signed)
 Returned a call back to the pt and endorsed recommendations per Reche Finder, NP.   Pt states her current dry weight this morning is 320 lbs.  Will update Reche Finder, NP on this.   Pt is aware to continue her lasix  40 mg po bid and we will send in a new script for her to take called Metolazone .  Pt aware to take Metolazone  2.5 mg po once per week 30 mins prior to morning lasix .  Advised the pt to start this regimen tomorrow.  Pt aware her script will say the following:  Metolazone  2.5 mg take 1 tablet (2.5 mg total) by mouth once per week (on Wednesdays) 30 mins prior to morning lasix .  Confirmed the pharmacy of choice with the pt.   Scheduled the pt to come into the office to see Rosaline Bane, NP on 08/12/24 at 2:45 pm, to re-evaluate symptoms and to discuss possible RHC, as recommended by [facility].  Pt aware Rosaline Bane, NP can updated her labs (BMET) at that visit.  Advised the pt to continue dry weighing herself daily and recording this.  Advised her to bring those recordings to her office visit appt with Rosaline Bane, NP. Advised her to continue wearing her compressions during the day and elevating her lower extremities at rest.  Advised her to maintain a low sodium diet.   Pt verbalized understanding and agrees with this plan.

## 2024-07-28 ENCOUNTER — Ambulatory Visit

## 2024-07-28 DIAGNOSIS — M6281 Muscle weakness (generalized): Secondary | ICD-10-CM

## 2024-07-28 DIAGNOSIS — R2689 Other abnormalities of gait and mobility: Secondary | ICD-10-CM

## 2024-07-28 NOTE — Therapy (Addendum)
 " OUTPATIENT PHYSICAL THERAPY EVALUATION   Patient Name: Tamara Daugherty MRN: [id number] DOB:Jan 16, 1951, 74 y.o., female Today's Date: 07/28/2024  END OF SESSION:  PT End of Session - 07/28/24 0854     Visit Number 1    Number of Visits 24    Date for Recertification  10/20/24    PT Start Time 0805    PT Stop Time 0847    PT Time Calculation (min) 42 min    Equipment Utilized During Treatment Gait belt;Oxygen    Activity Tolerance Patient tolerated treatment well;Patient limited by fatigue    Behavior During Therapy [facility] for tasks assessed/performed          Past Medical History:  Diagnosis Date   Allergy     Anemia    takes Iron  daily   Anxiety    Arthritis    Asthma    Symbicort  daily and Albuterol  daily as needed   B12 deficiency    Back pain    Bilateral swelling of feet    Bruises easily    d/t being on Plavix    Chest pain    Clotting disorder 2022   Complication of anesthesia    long time to wake up - referred to pulmonary after carpel tunnel procedure - for asthma and undiagnosed sleep apnea   Constipation    Depression    GERD (gastroesophageal reflux disease)    takes Protonix  daily   Headache    Heart murmur    [facility] [place] [facility]   Heart murmur    History of blood transfusion    no abnormal reaction noted   History of colon polyps    beningn   History of migraine    couple of wks ago was the last one   Hyperlipidemia    takes Atorvastatin  daily   Hypertension    takes Benicar  and Diltiazem  daily   Hypothyroidism    takes Synthroid  daily   Itching    takes Atarax  daily as needed   Joint pain    Joint pain    Joint swelling    Lactose intolerance    Leg swelling    Liver cyst    Mini stroke    Mitral regurgitation 01/27/2023   Multiple allergies    takes Claritin  daily as needed as well as using Flonase  if needed   Muscle spasm    takes Baclofen  if needed   Osteoarthritis    Peripheral edema    takes Torsemide  daily   Pneumonia     hx of   Prediabetes    Primary localized osteoarthritis of left knee 06/21/2014   Primary localized osteoarthritis of right knee 03/21/2015   Restrictive lung disease    Sleep apnea    cpap   Sleep apnea    Stroke (HCC) 2010   Swallowing difficulty    TIA (transient ischemic attack)    takes Plavix daily;notices right side is slightly weaker than left   Vertigo    takes Meclizine  daily as needed   Vitamin D  deficiency    Past Surgical History:  Procedure Laterality Date   ABDOMINAL HYSTERECTOMY     BACK SURGERY     BREAST SURGERY     cyst removal   CARDIAC CATHETERIZATION     carpel tunnel Right    COLONOSCOPY     ESOPHAGOGASTRODUODENOSCOPY     JOINT REPLACEMENT     KNEE ARTHROSCOPY     TONSILLECTOMY     TOTAL KNEE ARTHROPLASTY Left  06/21/2014   Procedure: LEFT TOTAL KNEE ARTHROPLASTY;  Surgeon: Fonda SHAUNNA Olmsted, MD;  Location: [facility] OR;  Service: Orthopedics;  Laterality: Left;   TOTAL KNEE ARTHROPLASTY Right 03/21/2015   Procedure: TOTAL KNEE ARTHROPLASTY STEROID INECTION BOTH KNEES;  Surgeon: Fonda Olmsted, MD;  Location: [facility] OR;  Service: Orthopedics;  Laterality: Right;   trigger thumb     Patient Active Problem List   Diagnosis Date Noted   SOB (shortness of breath) 04/09/2024   Diastolic dysfunction 04/09/2024   Hyperlipidemia LDL goal <70 04/09/2024   Coronary artery disease of native artery of native heart with stable angina pectoris 04/09/2024   Dyspnea 01/27/2023   Mitral regurgitation 01/27/2023   Iron  deficiency anemia 11/01/2020   Prediabetes 08/21/2020   Flu-like symptoms 10/28/2018   CAP (community acquired pneumonia) 08/11/2018   Heart murmur 01/09/2017   Restrictive lung disease 10/02/2016   Severe persistent allergic asthma (HCC) 08/30/2016   Chronic seasonal allergic rhinitis 08/30/2016   GERD (gastroesophageal reflux disease) 08/30/2016   H/O TIA (transient ischemic attack) and stroke 08/30/2016   OSA on CPAP 08/30/2016   Primary localized  osteoarthritis of right knee 03/21/2015   Hyponatremia 06/22/2014   Postoperative anemia due to acute blood loss 06/22/2014   Primary localized osteoarthritis of left knee 06/21/2014   Class 3 severe obesity with serious comorbidity and body mass index (BMI) of 50.0 to 59.9 in adult (HCC) 06/21/2014   Knee osteoarthritis 06/21/2014    PCP: Graydon Mt, MD   REFERRING PROVIDER: Vannie Reche RAMAN, NP  REFERRING DIAG: R53.81 (ICD-10-CM) - Physical deconditioning   Rationale for Evaluation and Treatment:  Rehabiliation  THERAPY DIAG:  Muscle weakness (generalized)  Other abnormalities of gait and mobility  ONSET DATE: 01/2024   SUBJECTIVE:                                                                                                                                                                                           SUBJECTIVE STATEMENT: Pt reports she was diagnosed with spinal stenosis and CHF. She also had a syncopal episode that lead to long hospitalization and significant deconditioning. She has been in a skilled nursing facility and had home health PT. She reports decreased ability to walk since August 2025. She reports being able to transition from a walker to a cane short distances. She enters the clinic in a wheelchair and 2L oxygen via nasal canula (constant). Seated/Resting HR:60 SPO2 92.  NEXT MD VISIT: 09/08/24  PERTINENT HISTORY:  See above PMH  PAIN:  NPRS scale: 0/10 upon arrival Pain location: ankles  Pain description: constant, achy, dull Aggravating factors:  Relieving  factors: rest, meds   PRECAUTIONS: ,  Fall  RED FLAGS: Bowel or bladder incontinence: No   WEIGHT BEARING RESTRICTIONS:  No  FALLS:  Has patient fallen in last 6 months? Yes. Number of falls 2   OCCUPATION:  Retired; veterinary surgeon   PLOF:  Independent with basic ADLs  PATIENT GOALS:  Return to walking with LRAD   OBJECTIVE:  Note: Objective measures were completed at  Evaluation unless otherwise noted.  DIAGNOSTIC FINDINGS:  N/A  PATIENT SURVEYS:   Patient-Specific Activity Scoring Scheme  0 represents unable to perform. 10 represents able to perform at prior level. 0 1 2 3 4 5 6 7 8 9  10 (Date and Score)   Activity Eval     1. Walking  4     2. Driving 6    3. Bathing 6   4.    5.    Score 5.33    Total score = sum of the activity scores/number of activities Minimum detectable change (90%CI) for average score = 2 points Minimum detectable change (90%CI) for single activity score = 3 points     EDEMA:  Yes: LE swelling bilaterally related to CHF  POSTURE:  rounded shoulders, forward head, increased lumbar lordosis, and increased thoracic kyphosis  GAIT: Assistive device utilized: Walker - 2 wheeled Level of assistance: SBA Comments: Wide BOS, with forward trunk lean and heavy UE utilization.     LOWER EXTREMITY ROM:      WFL in all planes  LOWER EXTREMITY MMT:     LE MMT 3+/5 throughout hip/knee  FUNCTIONAL TESTS:  30 seconds chair stand test: 9 2 minute walk test: 90 ft in 1 min 20 sec unable to continue SPO2 86; HR 60                                                                                                                              TREATMENT DATE:  Eval HEP creation and review with demonstration and trial set preformed, see below for details Selfcare:see education section below    PATIENT EDUCATION: Education details: HEP, PT plan of care, selfcare Person educated: Patient Education method: Explanation, Demonstration, Verbal cues, and Handouts Education comprehension: verbalized understanding, further education recommended   HOME EXERCISE PROGRAM: Access Code: [email or web address] URL: [email or web address] Date: 07/28/2024 Prepared by: Massie Ada  Exercises - Sit to Stand Without Arm Support  - 1 x daily - 7 x weekly - 3 sets - 5-10 reps  ASSESSMENT:  CLINICAL IMPRESSION:  Patient referred to PT for deconditioning. Patient will benefit from skilled PT to improve overall function and to address impairments and limitations listed below.  OBJECTIVE IMPAIRMENTS: decreased activity tolerance for ADL's, difficulty walking, decreased balance, decreased endurance, decreased mobility, decreased ROM, decreased strength, impaired flexibility, impaired LE use, and pain.  ACTIVITY LIMITATIONS: bending, lifting, walking, standing, cleaning, community activity, driving, reaching, carry,   PERSONAL FACTORS: see above PMH  also affecting patient's functional outcome.  REHAB POTENTIAL: Fair    CLINICAL DECISION MAKING: Unstable/unpredictable  EVALUATION COMPLEXITY: High    GOALS: Short term PT Goals Target date: 08/25/2024   Pt will be I and compliant with HEP. Baseline:  Goal status: New   Long term PT goals Target date:10/20/2024    Pt will improve  strength to at least 4/5 MMT to improve functional strength Baseline: Goal status: New Pt will improve Patient specific functional scale (PSFS) to at least 7/10 to show improved function level Baseline: Goal status: New Pt will be able to ambulate at least 150 ft in 2 minutes  Baseline: Goal status: New Pt will improve 30 second chair to stand to 12 in order to improvement in strength and endurance.  Baseline: Goal status: New  PLAN: PT FREQUENCY: 1-3 times per week   PT DURATION: 6-12 weeks  PLANNED INTERVENTIONS  [id number]-Therapeutic exercises, 97530- Therapeutic activity, [id number]- Neuromuscular re-education, 97535- Self Care, [id number]- Manual therapy, [id number]- Gait training, and Patient/Family education  PLAN FOR NEXT SESSION: Update HEP, Continue strength/conditioning, monitor vitals   Massie Ada PT, DPT 07/28/24 9:49 AM   "

## 2024-08-03 ENCOUNTER — Ambulatory Visit

## 2024-08-05 ENCOUNTER — Ambulatory Visit

## 2024-08-10 ENCOUNTER — Inpatient Hospital Stay: Admitting: Internal Medicine

## 2024-08-10 ENCOUNTER — Inpatient Hospital Stay: Attending: Internal Medicine

## 2024-08-10 ENCOUNTER — Ambulatory Visit

## 2024-08-11 ENCOUNTER — Ambulatory Visit: Admitting: Physical Therapy

## 2024-08-12 ENCOUNTER — Ambulatory Visit (HOSPITAL_BASED_OUTPATIENT_CLINIC_OR_DEPARTMENT_OTHER): Admitting: Nurse Practitioner

## 2024-08-16 ENCOUNTER — Ambulatory Visit

## 2024-08-17 ENCOUNTER — Ambulatory Visit

## 2024-08-19 ENCOUNTER — Ambulatory Visit: Admitting: Physical Therapy

## 2024-08-24 ENCOUNTER — Ambulatory Visit

## 2024-08-26 ENCOUNTER — Ambulatory Visit

## 2024-08-30 ENCOUNTER — Ambulatory Visit: Admitting: Physical Therapy

## 2024-09-02 ENCOUNTER — Ambulatory Visit

## 2024-09-06 ENCOUNTER — Ambulatory Visit

## 2024-09-08 ENCOUNTER — Ambulatory Visit (HOSPITAL_BASED_OUTPATIENT_CLINIC_OR_DEPARTMENT_OTHER): Admitting: Family

## 2024-09-09 ENCOUNTER — Ambulatory Visit

## 2024-09-13 ENCOUNTER — Ambulatory Visit

## 2024-09-16 ENCOUNTER — Ambulatory Visit

## 2024-09-20 ENCOUNTER — Ambulatory Visit

## 2024-09-23 ENCOUNTER — Ambulatory Visit

## 2024-09-27 ENCOUNTER — Ambulatory Visit

## 2024-09-30 ENCOUNTER — Ambulatory Visit

## 2024-10-04 ENCOUNTER — Ambulatory Visit

## 2024-10-07 ENCOUNTER — Ambulatory Visit

## 2024-10-11 ENCOUNTER — Ambulatory Visit

## 2024-10-14 ENCOUNTER — Ambulatory Visit
